# Patient Record
Sex: Female | Born: 1960
Health system: Southern US, Community
[De-identification: ages and names within clinical notes are randomized; demographics above are authoritative.]

## PROBLEM LIST (undated history)

## (undated) DIAGNOSIS — I10 Essential (primary) hypertension: Secondary | ICD-10-CM

## (undated) DIAGNOSIS — E119 Type 2 diabetes mellitus without complications: Secondary | ICD-10-CM

## (undated) DIAGNOSIS — M51369 Other intervertebral disc degeneration, lumbar region without mention of lumbar back pain or lower extremity pain: Secondary | ICD-10-CM

## (undated) DIAGNOSIS — M5442 Lumbago with sciatica, left side: Secondary | ICD-10-CM

## (undated) DIAGNOSIS — M5136 Other intervertebral disc degeneration, lumbar region: Secondary | ICD-10-CM

## (undated) DIAGNOSIS — J449 Chronic obstructive pulmonary disease, unspecified: Secondary | ICD-10-CM

## (undated) DIAGNOSIS — M5441 Lumbago with sciatica, right side: Secondary | ICD-10-CM

## (undated) DIAGNOSIS — K219 Gastro-esophageal reflux disease without esophagitis: Secondary | ICD-10-CM

## (undated) DIAGNOSIS — I2510 Atherosclerotic heart disease of native coronary artery without angina pectoris: Secondary | ICD-10-CM

## (undated) DIAGNOSIS — J45909 Unspecified asthma, uncomplicated: Secondary | ICD-10-CM

## (undated) DIAGNOSIS — E785 Hyperlipidemia, unspecified: Secondary | ICD-10-CM

## (undated) DIAGNOSIS — M797 Fibromyalgia: Secondary | ICD-10-CM

## (undated) HISTORY — PX: KNEE ARTHROSCOPY: SHX127

## (undated) HISTORY — PX: ABDOMINAL HYSTERECTOMY: SHX81

---

## 2004-08-02 ENCOUNTER — Emergency Department: Payer: Self-pay | Admitting: Emergency Medicine

## 2004-11-06 ENCOUNTER — Ambulatory Visit: Payer: Self-pay | Admitting: Internal Medicine

## 2006-04-16 ENCOUNTER — Other Ambulatory Visit: Payer: Self-pay

## 2006-04-16 ENCOUNTER — Emergency Department: Payer: Self-pay | Admitting: Emergency Medicine

## 2006-06-17 ENCOUNTER — Emergency Department: Payer: Self-pay | Admitting: Emergency Medicine

## 2006-06-17 ENCOUNTER — Other Ambulatory Visit: Payer: Self-pay

## 2006-11-10 ENCOUNTER — Emergency Department: Payer: Self-pay | Admitting: Emergency Medicine

## 2006-11-10 ENCOUNTER — Other Ambulatory Visit: Payer: Self-pay

## 2007-11-08 ENCOUNTER — Emergency Department: Payer: Self-pay | Admitting: Emergency Medicine

## 2007-11-08 ENCOUNTER — Other Ambulatory Visit: Payer: Self-pay

## 2009-06-10 ENCOUNTER — Emergency Department: Payer: Self-pay | Admitting: Emergency Medicine

## 2009-06-21 ENCOUNTER — Ambulatory Visit: Payer: Self-pay | Admitting: Orthopedic Surgery

## 2009-07-05 ENCOUNTER — Ambulatory Visit: Payer: Self-pay | Admitting: Orthopedic Surgery

## 2009-07-13 ENCOUNTER — Ambulatory Visit: Payer: Self-pay | Admitting: Orthopedic Surgery

## 2009-10-08 ENCOUNTER — Ambulatory Visit: Payer: Self-pay | Admitting: Orthopedic Surgery

## 2009-10-18 ENCOUNTER — Ambulatory Visit: Payer: Self-pay | Admitting: Orthopedic Surgery

## 2009-12-12 ENCOUNTER — Ambulatory Visit: Payer: Self-pay | Admitting: Orthopedic Surgery

## 2009-12-14 ENCOUNTER — Ambulatory Visit: Payer: Self-pay | Admitting: Orthopedic Surgery

## 2011-01-07 ENCOUNTER — Ambulatory Visit: Payer: Self-pay | Admitting: Orthopedic Surgery

## 2011-01-20 ENCOUNTER — Inpatient Hospital Stay: Payer: Self-pay | Admitting: Orthopedic Surgery

## 2011-01-21 LAB — PATHOLOGY REPORT

## 2011-02-27 ENCOUNTER — Ambulatory Visit: Payer: Self-pay | Admitting: Orthopedic Surgery

## 2011-04-08 ENCOUNTER — Encounter: Payer: Self-pay | Admitting: Physician Assistant

## 2011-04-20 ENCOUNTER — Encounter: Payer: Self-pay | Admitting: Physician Assistant

## 2012-04-14 ENCOUNTER — Emergency Department: Payer: Self-pay | Admitting: *Deleted

## 2012-09-28 ENCOUNTER — Ambulatory Visit: Payer: Self-pay | Admitting: Pain Medicine

## 2013-05-15 ENCOUNTER — Emergency Department: Payer: Self-pay | Admitting: Emergency Medicine

## 2013-07-13 ENCOUNTER — Encounter: Payer: Self-pay | Admitting: Geriatric Medicine

## 2013-07-20 ENCOUNTER — Encounter: Payer: Self-pay | Admitting: Geriatric Medicine

## 2013-07-26 ENCOUNTER — Ambulatory Visit: Payer: Self-pay | Admitting: Geriatric Medicine

## 2013-08-20 ENCOUNTER — Encounter: Payer: Self-pay | Admitting: Geriatric Medicine

## 2014-04-03 ENCOUNTER — Emergency Department: Payer: Self-pay | Admitting: Emergency Medicine

## 2014-06-21 ENCOUNTER — Emergency Department: Payer: Self-pay | Admitting: Emergency Medicine

## 2016-02-11 ENCOUNTER — Emergency Department
Admission: EM | Admit: 2016-02-11 | Discharge: 2016-02-11 | Disposition: A | Discharge status: Home / Self Care | Payer: Medicare HMO | Attending: Emergency Medicine | Admitting: Emergency Medicine

## 2016-02-11 DIAGNOSIS — F17210 Nicotine dependence, cigarettes, uncomplicated: Secondary | ICD-10-CM | POA: Diagnosis not present

## 2016-02-11 DIAGNOSIS — W57XXXA Bitten or stung by nonvenomous insect and other nonvenomous arthropods, initial encounter: Secondary | ICD-10-CM | POA: Diagnosis not present

## 2016-02-11 DIAGNOSIS — Y939 Activity, unspecified: Secondary | ICD-10-CM | POA: Diagnosis not present

## 2016-02-11 DIAGNOSIS — E119 Type 2 diabetes mellitus without complications: Secondary | ICD-10-CM | POA: Diagnosis not present

## 2016-02-11 DIAGNOSIS — I10 Essential (primary) hypertension: Secondary | ICD-10-CM | POA: Diagnosis not present

## 2016-02-11 DIAGNOSIS — Y929 Unspecified place or not applicable: Secondary | ICD-10-CM | POA: Insufficient documentation

## 2016-02-11 DIAGNOSIS — Y999 Unspecified external cause status: Secondary | ICD-10-CM | POA: Diagnosis not present

## 2016-02-11 DIAGNOSIS — S40262A Insect bite (nonvenomous) of left shoulder, initial encounter: Secondary | ICD-10-CM | POA: Insufficient documentation

## 2016-02-11 DIAGNOSIS — IMO0001 Reserved for inherently not codable concepts without codable children: Secondary | ICD-10-CM

## 2016-02-11 HISTORY — DX: Essential (primary) hypertension: I10

## 2016-02-11 HISTORY — DX: Type 2 diabetes mellitus without complications: E11.9

## 2016-02-11 HISTORY — DX: Fibromyalgia: M79.7

## 2016-02-11 MED ORDER — BACITRACIN ZINC 500 UNIT/GM EX OINT
TOPICAL_OINTMENT | Freq: Every day | CUTANEOUS | Status: DC
  Administered 2016-02-11: 1 via TOPICAL
  Filled 2016-02-11: qty 0.9

## 2016-02-11 NOTE — ED Notes (Signed)
Pt states she thinks she was bit by a spider Saturday.. Pt has an abrasion to the right upper shoulder.Marland Kitchen

## 2016-02-13 NOTE — ED Provider Notes (Signed)
[facility] Emergency Department Provider Note ____________________________________________  Time seen: 1351  I have reviewed the triage vital signs and the nursing notes.  HISTORY  Chief Complaint  Insect Bite   HPI Anne Brennan is a 55 y.o. female presents to the ED for evaluation of what she assumes was a spider bite to the posterior left shoulder blade.She believed the contact In on Saturday. Since that time her husband has been applying both alcohol and peroxide regularly to the region. She describes initially a small red bump with a small pustule in the center. Her husband describes that there was only clear fluid that was expressed from the pustule. The patient has continued to cleanse the area using peroxide. She presents today for evaluation where she assumes is an infected spider bite. She denies any fevers, chills, sweats. She rates her overall discomfort 8/10 in triage.  Past Medical History  Diagnosis Date  . Hypertension   . Diabetes mellitus without complication ([place])   . Fibromyalgia     There are no active problems to display for this patient.   Past Surgical History  Procedure Laterality Date  . Knee arthroscopy Bilateral     No current outpatient prescriptions on file.  Allergies Ibuprofen and Penicillins  No family history on file.  Social History Social History  Substance Use Topics  . Smoking status: Current Every Day Smoker    Types: Cigarettes  . Smokeless tobacco: None  . Alcohol Use: No   Review of Systems  Constitutional: Negative for fever. Musculoskeletal: Negative for back pain. Skin: Negative for rash. Insect bite as above Neurological: Negative for headaches, focal weakness or numbness. ____________________________________________  PHYSICAL EXAM:  VITAL SIGNS: ED Triage Vitals  Enc Vitals Group     BP 02/11/16 1231 164/77 mmHg     Pulse Rate 02/11/16 1231 87     Resp 02/11/16 1231 18     Temp  02/11/16 1231 97.9 F (36.6 C)     Temp Source 02/11/16 1231 Oral     SpO2 02/11/16 1231 94 %     Weight 02/11/16 1231 179 lb (81.194 kg)     Height 02/11/16 1231 5\' 4"  (1.626 m)     Head Cir --      Peak Flow --      Pain Score 02/11/16 1237 8     Pain Loc --      Pain Edu? --      Excl. in [place]? --    Constitutional: Alert and oriented. Well appearing and in no distress. Head: Normocephalic and atraumatic. Respiratory: Normal respiratory effort.  Musculoskeletal: Nontender with normal range of motion in all extremities.  Neurologic:  Normal gait without ataxia. Normal speech and language. No gross focal neurologic deficits are appreciated. Skin:  Skin is warm, dry and intact. No rash noted. Patient with a local scabbed area to the left shoulder blade without local erythema, edema, or induration. The skin is actually scabbed at this time consistent with a chemical burn due to persistent application of hydrogen peroxide. ____________________________________________  PROCEDURES  Bacitracin ointment Elastic bandage  ____________________________________________  INITIAL IMPRESSION / ASSESSMENT AND PLAN / ED COURSE  Patient with a likely insect bite to the left upper back without signs of infection. She does have a local superficial burn due to larger peroxide application. Patient is advised to discontinue all use of alcohol and peroxide. She is as it advised to apply antibiotic ointment to the area. She'll follow with primary  care provider as needed.  __________________________________________  FINAL CLINICAL IMPRESSION(S) / ED DIAGNOSES  Final diagnoses:  Insect bite of back, left, initial encounter      Melvenia Needles, PA-C 02/13/16 0116  Schuyler Amor, MD 02/14/16 2007

## 2016-09-13 ENCOUNTER — Encounter: Payer: Self-pay | Admitting: Emergency Medicine

## 2016-09-13 ENCOUNTER — Emergency Department
Admission: EM | Admit: 2016-09-13 | Discharge: 2016-09-13 | Disposition: A | Discharge status: Home / Self Care | Payer: Medicare HMO | Attending: Student in an Organized Health Care Education/Training Program | Admitting: Student in an Organized Health Care Education/Training Program

## 2016-09-13 DIAGNOSIS — F17210 Nicotine dependence, cigarettes, uncomplicated: Secondary | ICD-10-CM | POA: Diagnosis not present

## 2016-09-13 DIAGNOSIS — I10 Essential (primary) hypertension: Secondary | ICD-10-CM | POA: Diagnosis not present

## 2016-09-13 DIAGNOSIS — E119 Type 2 diabetes mellitus without complications: Secondary | ICD-10-CM | POA: Diagnosis not present

## 2016-09-13 DIAGNOSIS — J0100 Acute maxillary sinusitis, unspecified: Secondary | ICD-10-CM | POA: Insufficient documentation

## 2016-09-13 DIAGNOSIS — J029 Acute pharyngitis, unspecified: Secondary | ICD-10-CM

## 2016-09-13 DIAGNOSIS — R05 Cough: Secondary | ICD-10-CM | POA: Diagnosis present

## 2016-09-13 MED ORDER — SULFAMETHOXAZOLE-TRIMETHOPRIM 800-160 MG PO TABS
1.0000 | ORAL_TABLET | Freq: Once | ORAL | Status: AC
  Administered 2016-09-13: 1 via ORAL
  Filled 2016-09-13: qty 1

## 2016-09-13 MED ORDER — HYDROCOD POLST-CPM POLST ER 10-8 MG/5ML PO SUER
5.0000 mL | Freq: Once | ORAL | Status: AC
  Administered 2016-09-13: 5 mL via ORAL
  Filled 2016-09-13: qty 5

## 2016-09-13 MED ORDER — FLUCONAZOLE 150 MG PO TABS: 150.0000 mg | ORAL_TABLET | Freq: Every day | ORAL | 0 refills | Status: DC

## 2016-09-13 MED ORDER — HYDROCOD POLST-CPM POLST ER 10-8 MG/5ML PO SUER: 5.0000 mL | Freq: Two times a day (BID) | ORAL | 0 refills | Status: DC

## 2016-09-13 MED ORDER — PSEUDOEPHEDRINE HCL ER 120 MG PO TB12: 120.0000 mg | ORAL_TABLET | Freq: Two times a day (BID) | ORAL | 0 refills | Status: AC | PRN

## 2016-09-13 MED ORDER — SULFAMETHOXAZOLE-TRIMETHOPRIM 800-160 MG PO TABS: 1.0000 | ORAL_TABLET | Freq: Two times a day (BID) | ORAL | 0 refills | Status: DC

## 2016-09-13 NOTE — ED Triage Notes (Signed)
Sore throat and cough x 4 days.  ?

## 2016-09-13 NOTE — ED Provider Notes (Signed)
[facility] Emergency Department Provider Note   ____________________________________________   None    (approximate)  I have reviewed the triage vital signs and the nursing notes.   HISTORY  Chief Complaint Sore Throat    HPI Anne Brennan is a 55 y.o. female patient complain nasal congestion and postnasal drainage 2 weeks. Patient state her last 4 days she developed a sore throat and a productive cough. Patient also complaining of ear pressure but denies hearing loss. Patient denies nausea vomiting diarrhea.Patient rates the pain as a 9/10. Patient describes the pain as "achy". No palliative measures for his complaint.   Past Medical History:  Diagnosis Date  . Diabetes mellitus without complication ([place])   . Fibromyalgia   . Hypertension     There are no active problems to display for this patient.   Past Surgical History:  Procedure Laterality Date  . KNEE ARTHROSCOPY Bilateral     Prior to Admission medications   Medication Sig Start Date End Date Taking? Authorizing Provider  chlorpheniramine-HYDROcodone (TUSSIONEX PENNKINETIC ER) 10-8 MG/5ML SUER Take 5 mLs by mouth 2 (two) times daily. 09/13/16   Sable Feil, PA-C  fluconazole (DIFLUCAN) 150 MG tablet Take 1 tablet (150 mg total) by mouth daily. 09/13/16   Sable Feil, PA-C  pseudoephedrine (SUDAFED) 120 MG 12 hr tablet Take 1 tablet (120 mg total) by mouth 2 (two) times daily as needed for congestion. 09/13/16 09/13/17  Sable Feil, PA-C  sulfamethoxazole-trimethoprim (BACTRIM DS,SEPTRA DS) 800-160 MG tablet Take 1 tablet by mouth 2 (two) times daily. 09/13/16   Sable Feil, PA-C    Allergies Ibuprofen and Penicillins  No family history on file.  Social History Social History  Substance Use Topics  . Smoking status: Current Every Day Smoker    Packs/day: 0.50    Types: Cigarettes  . Smokeless tobacco: Not on file  . Alcohol use No    Review of  Systems Constitutional: No fever/chills Eyes: No visual changes. ENT: No sore throat. Cardiovascular: Denies chest pain. Respiratory: Denies shortness of breath. Gastrointestinal: No abdominal pain.  No nausea, no vomiting.  No diarrhea.  No constipation. Genitourinary: Negative for dysuria. Musculoskeletal: Negative for back pain. Skin: Negative for rash. Neurological: Negative for headaches, focal weakness or numbness. Endocrine:Diabetes and hypertension. Allergic/Immunilogical: See medication list __________________________________________   PHYSICAL EXAM:  VITAL SIGNS: ED Triage Vitals  Enc Vitals Group     BP 09/13/16 1053 (!) 156/103     Pulse Rate 09/13/16 1053 (!) 110     Resp 09/13/16 1053 18     Temp 09/13/16 1053 98.4 F (36.9 C)     Temp Source 09/13/16 1053 Oral     SpO2 09/13/16 1053 94 %     Weight 09/13/16 1055 180 lb (81.6 kg)     Height 09/13/16 1055 5\' 4"  (1.626 m)     Head Circumference --      Peak Flow --      Pain Score 09/13/16 1056 9     Pain Loc --      Pain Edu? --      Excl. in [place]? --     Constitutional: Alert and oriented. Well appearing and in no acute distress. Eyes: Conjunctivae are normal. PERRL. EOMI. Head: Atraumatic. Nose: Bilateral maxillary guarding with edematous nasal turbinates.  Mouth/Throat: Mucous membranes are moist.  Oropharynx erythematous. Edematous but non-exudative tonsils. Neck: No stridor.  No cervical spine tenderness to palpation. Cardiovascular: Normal rate,  regular rhythm. Grossly normal heart sounds.  Good peripheral circulation.Elevated blood pressure Respiratory: Normal respiratory effort.  No retractions. Lungs CTAB. Gastrointestinal: Soft and nontender. No distention. No abdominal bruits. No CVA tenderness. Musculoskeletal: No lower extremity tenderness nor edema.  No joint effusions. Neurologic:  Normal speech and language. No gross focal neurologic deficits are appreciated. No gait instability. Skin:  Skin  is warm, dry and intact. No rash noted. Psychiatric: Mood and affect are normal. Speech and behavior are normal.  ____________________________________________   LABS (all labs ordered are listed, but only abnormal results are displayed)  Labs Reviewed - No data to display ____________________________________________  EKG   ____________________________________________  RADIOLOGY   ____________________________________________   PROCEDURES  Procedure(s) performed: None  Procedures  Critical Care performed: No  ____________________________________________   INITIAL IMPRESSION / ASSESSMENT AND PLAN / ED COURSE  Pertinent labs & imaging results that were available during my care of the patient were reviewed by me and considered in my medical decision making (see chart for details).  Maxillary sinusitis and pharyngitis. Patient given discharge Instructions. Patient given a prescription for Bactrim DS, Tussionex, Diflucan, and Sudafed. Patient advised follow-up family doctor if no improvement 3 days.  Clinical Course      ____________________________________________   FINAL CLINICAL IMPRESSION(S) / ED DIAGNOSES  Final diagnoses:  Subacute maxillary sinusitis  Sore throat      NEW MEDICATIONS STARTED DURING THIS VISIT:  New Prescriptions   CHLORPHENIRAMINE-HYDROCODONE (TUSSIONEX PENNKINETIC ER) 10-8 MG/5ML SUER    Take 5 mLs by mouth 2 (two) times daily.   FLUCONAZOLE (DIFLUCAN) 150 MG TABLET    Take 1 tablet (150 mg total) by mouth daily.   PSEUDOEPHEDRINE (SUDAFED) 120 MG 12 HR TABLET    Take 1 tablet (120 mg total) by mouth 2 (two) times daily as needed for congestion.   SULFAMETHOXAZOLE-TRIMETHOPRIM (BACTRIM DS,SEPTRA DS) 800-160 MG TABLET    Take 1 tablet by mouth 2 (two) times daily.     Note:  This document was prepared using Dragon voice recognition software and may include unintentional dictation errors.    Sable Feil, PA-C 09/13/16 1128     Merlyn Lot, MD 09/13/16 1154

## 2016-10-27 ENCOUNTER — Encounter: Payer: Self-pay | Admitting: *Deleted

## 2016-10-27 ENCOUNTER — Emergency Department
Admission: EM | Admit: 2016-10-27 | Discharge: 2016-10-27 | Disposition: A | Discharge status: Home / Self Care | Payer: Medicare HMO | Attending: Emergency Medicine | Admitting: Emergency Medicine

## 2016-10-27 ENCOUNTER — Emergency Department: Payer: Medicare HMO

## 2016-10-27 DIAGNOSIS — I10 Essential (primary) hypertension: Secondary | ICD-10-CM | POA: Diagnosis not present

## 2016-10-27 DIAGNOSIS — F17210 Nicotine dependence, cigarettes, uncomplicated: Secondary | ICD-10-CM | POA: Insufficient documentation

## 2016-10-27 DIAGNOSIS — J189 Pneumonia, unspecified organism: Secondary | ICD-10-CM | POA: Diagnosis not present

## 2016-10-27 DIAGNOSIS — E119 Type 2 diabetes mellitus without complications: Secondary | ICD-10-CM | POA: Insufficient documentation

## 2016-10-27 DIAGNOSIS — R05 Cough: Secondary | ICD-10-CM | POA: Diagnosis present

## 2016-10-27 DIAGNOSIS — Z79899 Other long term (current) drug therapy: Secondary | ICD-10-CM | POA: Diagnosis not present

## 2016-10-27 MED ORDER — AZITHROMYCIN 250 MG PO TABS
ORAL_TABLET | ORAL | 0 refills | Status: DC
Start: 2016-10-27 — End: 2017-10-14

## 2016-10-27 MED ORDER — ALBUTEROL SULFATE (2.5 MG/3ML) 0.083% IN NEBU
2.5000 mg | INHALATION_SOLUTION | Freq: Once | RESPIRATORY_TRACT | Status: AC
  Administered 2016-10-27: 2.5 mg via RESPIRATORY_TRACT
  Filled 2016-10-27: qty 3

## 2016-10-27 MED ORDER — METHYLPREDNISOLONE SODIUM SUCC 125 MG IJ SOLR
125.0000 mg | Freq: Once | INTRAMUSCULAR | Status: AC
  Administered 2016-10-27: 125 mg via INTRAMUSCULAR
  Filled 2016-10-27: qty 2

## 2016-10-27 MED ORDER — PREDNISONE 50 MG PO TABS: 50.0000 mg | ORAL_TABLET | Freq: Every day | ORAL | 0 refills | Status: DC

## 2016-10-27 NOTE — ED Triage Notes (Signed)
Pt reports a cough, bodyaches, runny nose for 2-3 weeks.  Pt also has sinus congestion.  Pt alert.

## 2016-10-27 NOTE — ED Provider Notes (Signed)
[facility] Emergency Department Provider Note  ____________________________________________  Time seen: Approximately 8:47 PM  I have reviewed the triage vital signs and the nursing notes.   HISTORY  Chief Complaint Influenza    HPI Anne Brennan is a 56 y.o. female who presents emergency department complaining of 3 week history of nasal congestion, scratchy throat, coughing. Patient's symptoms began somewhat to her URI. She states that concerning symptoms are the cough that has lingered for the last 3 weeks. Patient states that she does not feel "awful" she just does not feel "good." Patient denies any history of asthma, COPD, emphysema. Patient denies any fevers or chills. She denies any chest pain or difficulty breathing. Patient has tried multiple over-the-counter medications with no improvement of symptoms.   Past Medical History:  Diagnosis Date  . Diabetes mellitus without complication ([place])   . Fibromyalgia   . Hypertension     There are no active problems to display for this patient.   Past Surgical History:  Procedure Laterality Date  . KNEE ARTHROSCOPY Bilateral     Prior to Admission medications   Medication Sig Start Date End Date Taking? Authorizing Provider  azithromycin (ZITHROMAX Z-PAK) 250 MG tablet Take 2 tablets (500 mg) on  Day 1,  followed by 1 tablet (250 mg) once daily on Days 2 through 5. 10/27/16   Roderic Palau D Maurilio Puryear, PA-C  chlorpheniramine-HYDROcodone (TUSSIONEX PENNKINETIC ER) 10-8 MG/5ML SUER Take 5 mLs by mouth 2 (two) times daily. 09/13/16   Sable Feil, PA-C  fluconazole (DIFLUCAN) 150 MG tablet Take 1 tablet (150 mg total) by mouth daily. 09/13/16   Sable Feil, PA-C  predniSONE (DELTASONE) 50 MG tablet Take 1 tablet (50 mg total) by mouth daily with breakfast. 10/27/16   Charline Bills Adella Manolis, PA-C  pseudoephedrine (SUDAFED) 120 MG 12 hr tablet Take 1 tablet (120 mg total) by mouth 2 (two) times daily as  needed for congestion. 09/13/16 09/13/17  Sable Feil, PA-C  sulfamethoxazole-trimethoprim (BACTRIM DS,SEPTRA DS) 800-160 MG tablet Take 1 tablet by mouth 2 (two) times daily. 09/13/16   Sable Feil, PA-C    Allergies Ibuprofen and Penicillins  No family history on file.  Social History Social History  Substance Use Topics  . Smoking status: Current Every Day Smoker    Packs/day: 0.50    Types: Cigarettes  . Smokeless tobacco: Never Used  . Alcohol use No     Review of Systems  Constitutional: No fever/chills Eyes: No visual changes. No discharge ENT: Resolved, nasal congestion and sore throat. Cardiovascular: no chest pain. Respiratory: Positive dry cough. No SOB. Gastrointestinal: No abdominal pain.  No nausea, no vomiting.  No diarrhea.  No constipation. Musculoskeletal: Negative for musculoskeletal pain. Skin: Negative for rash, abrasions, lacerations, ecchymosis. Neurological: Negative for headaches, focal weakness or numbness. 10-point ROS otherwise negative.  ____________________________________________   PHYSICAL EXAM:  VITAL SIGNS: ED Triage Vitals  Enc Vitals Group     BP 10/27/16 2022 (!) 150/87     Pulse Rate 10/27/16 2022 94     Resp 10/27/16 2022 20     Temp 10/27/16 2022 98.2 F (36.8 C)     Temp Source 10/27/16 2022 Oral     SpO2 10/27/16 2022 97 %     Weight 10/27/16 2023 174 lb (78.9 kg)     Height 10/27/16 2023 5\' 4"  (1.626 m)     Head Circumference --      Peak Flow --  Pain Score 10/27/16 2023 8     Pain Loc --      Pain Edu? --      Excl. in [place]? --      Constitutional: Alert and oriented. Well appearing and in no acute distress. Eyes: Conjunctivae are normal. PERRL. EOMI. Head: Atraumatic. ENT:      Ears: EACs and TMs unremarkable bilaterally      Nose: No congestion/rhinnorhea.      Mouth/Throat: Mucous membranes are moist.  Neck: No stridor.   Hematological/Lymphatic/Immunilogical: No cervical  lymphadenopathy. Cardiovascular: Normal rate, regular rhythm. Normal S1 and S2.  Good peripheral circulation. Respiratory: Normal respiratory effort without tachypnea or retractions. Lungs with diffuse respiratory wheezing bilaterally. Mild rhonchi noted to the left lower lung field. No rales.Kermit Balo air entry to the bases with no decreased or absent breath sounds. Musculoskeletal: Full range of motion to all extremities. No gross deformities appreciated. Neurologic:  Normal speech and language. No gross focal neurologic deficits are appreciated.  Skin:  Skin is warm, dry and intact. No rash noted. Psychiatric: Mood and affect are normal. Speech and behavior are normal. Patient exhibits appropriate insight and judgement.   ____________________________________________   LABS (all labs ordered are listed, but only abnormal results are displayed)  Labs Reviewed - No data to display ____________________________________________  EKG   ____________________________________________  RADIOLOGY Diamantina [place] Chandlor Noecker, personally viewed and evaluated these images (plain radiographs) as part of my medical decision making, as well as reviewing the written report by the radiologist.  Dg Chest 2 View  Result Date: 10/27/2016 CLINICAL DATA:  Cough and body aches EXAM: CHEST  2 VIEW COMPARISON:  01/22/2011 FINDINGS: The heart size and mediastinal contours are within normal limits. Both lungs are clear. Degenerative changes of the spine. IMPRESSION: No active cardiopulmonary disease. Electronically Signed   By: Donavan Foil M.D.   On: 10/27/2016 21:28    ____________________________________________    PROCEDURES  Procedure(s) performed:    Procedures    Medications  albuterol (PROVENTIL) (2.5 MG/3ML) 0.083% nebulizer solution 2.5 mg (2.5 mg Nebulization Given 10/27/16 2207)  methylPREDNISolone sodium succinate (SOLU-MEDROL) 125 mg/2 mL injection 125 mg (125 mg Intramuscular Given 10/27/16  2205)     ____________________________________________   INITIAL IMPRESSION / ASSESSMENT AND PLAN / ED COURSE  Pertinent labs & imaging results that were available during my care of the patient were reviewed by me and considered in my medical decision making (see chart for details).  Review of the [place] CSRS was performed in accordance of the [place] prior to dispensing any controlled drugs.  Clinical Course     Patient's diagnosis is consistent with Community-acquired pneumonia. Patient's symptoms admit ongoing 3 weeks. Chest x-ray reveals no acute consolidation. Patient had diffuse wheezing and mild rhonchi to the left lower lung field. Lung sounds improved after injection of steroid and breathing treatment in emergency department. Patient has an albuterol inhaler at home from a previous infection is encouraged to use same every 4 hours.. Patient will be discharged home with prescriptions for short course of steroids and antibiotics. Patient is to follow up with primary care as needed or otherwise directed. Patient is given ED precautions to return to the ED for any worsening or new symptoms.     ____________________________________________  FINAL CLINICAL IMPRESSION(S) / ED DIAGNOSES  Final diagnoses:  Community acquired pneumonia, unspecified laterality      NEW MEDICATIONS STARTED DURING THIS VISIT:  New Prescriptions   AZITHROMYCIN (ZITHROMAX Z-PAK) 250 MG TABLET  Take 2 tablets (500 mg) on  Day 1,  followed by 1 tablet (250 mg) once daily on Days 2 through 5.   PREDNISONE (DELTASONE) 50 MG TABLET    Take 1 tablet (50 mg total) by mouth daily with breakfast.        This chart was dictated using voice recognition software/Dragon. Despite best efforts to proofread, errors can occur which can change the meaning. Any change was purely unintentional.    Darletta Moll, PA-C 10/27/16 2222    Eula Listen, MD 10/27/16 2324

## 2017-02-06 ENCOUNTER — Encounter: Payer: Self-pay | Admitting: *Deleted

## 2017-02-06 DIAGNOSIS — R51 Headache: Secondary | ICD-10-CM | POA: Insufficient documentation

## 2017-02-06 DIAGNOSIS — F17210 Nicotine dependence, cigarettes, uncomplicated: Secondary | ICD-10-CM | POA: Diagnosis not present

## 2017-02-06 DIAGNOSIS — R0981 Nasal congestion: Secondary | ICD-10-CM | POA: Diagnosis not present

## 2017-02-06 DIAGNOSIS — E119 Type 2 diabetes mellitus without complications: Secondary | ICD-10-CM | POA: Diagnosis not present

## 2017-02-06 DIAGNOSIS — R0789 Other chest pain: Secondary | ICD-10-CM | POA: Insufficient documentation

## 2017-02-06 DIAGNOSIS — I10 Essential (primary) hypertension: Secondary | ICD-10-CM | POA: Insufficient documentation

## 2017-02-06 NOTE — ED Triage Notes (Signed)
Pt to triage via wheelchair.  Pt has a headache pt also reports nausea.  Pt took tylenol without relief.  Pt is alert.  Speech clear.

## 2017-02-07 ENCOUNTER — Emergency Department: Payer: Medicare HMO

## 2017-02-07 ENCOUNTER — Emergency Department
Admission: EM | Admit: 2017-02-07 | Discharge: 2017-02-07 | Disposition: A | Discharge status: Home / Self Care | Payer: Medicare HMO | Attending: Emergency Medicine | Admitting: Emergency Medicine

## 2017-02-07 DIAGNOSIS — R079 Chest pain, unspecified: Secondary | ICD-10-CM

## 2017-02-07 DIAGNOSIS — R51 Headache: Secondary | ICD-10-CM

## 2017-02-07 DIAGNOSIS — R519 Headache, unspecified: Secondary | ICD-10-CM

## 2017-02-07 LAB — CBC
HEMATOCRIT: 41.9 % (ref 35.0–47.0)
HEMOGLOBIN: 14.5 g/dL (ref 12.0–16.0)
MCH: 31.3 pg (ref 26.0–34.0)
MCHC: 34.7 g/dL (ref 32.0–36.0)
MCV: 90.3 fL (ref 80.0–100.0)
Platelets: 311 10*3/uL (ref 150–440)
RBC: 4.64 MIL/uL (ref 3.80–5.20)
RDW: 13.6 % (ref 11.5–14.5)
WBC: 8.8 10*3/uL (ref 3.6–11.0)

## 2017-02-07 LAB — BASIC METABOLIC PANEL
ANION GAP: 9 (ref 5–15)
BUN: 14 mg/dL (ref 6–20)
CHLORIDE: 103 mmol/L (ref 101–111)
CO2: 28 mmol/L (ref 22–32)
Calcium: 9.7 mg/dL (ref 8.9–10.3)
Creatinine, Ser: 0.7 mg/dL (ref 0.44–1.00)
GFR calc non Af Amer: >60 mL/min (ref >60)
GLUCOSE: 147 mg/dL — AB (ref 65–99)
Potassium: 3.1 mmol/L — ABNORMAL LOW (ref 3.5–5.1)
Sodium: 140 mmol/L (ref 135–145)

## 2017-02-07 LAB — TROPONIN I: Troponin I: <0.03 ng/mL (ref <0.03)

## 2017-02-07 MED ORDER — AZITHROMYCIN 250 MG PO TABS: ORAL_TABLET | ORAL | 0 refills | Status: AC

## 2017-02-07 MED ORDER — METOCLOPRAMIDE HCL 5 MG/ML IJ SOLN
10.0000 mg | Freq: Once | INTRAMUSCULAR | Status: AC
  Administered 2017-02-07: 10 mg via INTRAVENOUS
  Filled 2017-02-07: qty 2

## 2017-02-07 MED ORDER — MORPHINE SULFATE (PF) 2 MG/ML IV SOLN
2.0000 mg | Freq: Once | INTRAVENOUS | Status: AC
  Administered 2017-02-07: 2 mg via INTRAVENOUS
  Filled 2017-02-07: qty 1

## 2017-02-07 NOTE — ED Provider Notes (Signed)
[facility] Emergency Department Provider Note    First MD Initiated Contact with Patient 02/07/17 0040     (approximate)  I have reviewed the triage vital signs and the nursing notes.   HISTORY  Chief Complaint Headache    HPI Anne Brennan is a 56 y.o. female with below list of chronic medical conditions presents with multiple medical complaints including frontal headache for "a while". Patient states about a month. Patient also admits to nausea however no vomiting. Patient also admits to left lateral chest discomfort. Patient denies any cough no dyspnea. Patient states her current pain score was 9 out of 10. Patient denies any largely pain or swelling. No history DVT or PE. Regarding headache patient denies any weakness numbness gait instability or visual changes. Patient does admit to nasal congestion and concern for possible sinusitis.   Past Medical History:  Diagnosis Date  . Diabetes mellitus without complication ([place])   . Fibromyalgia   . Hypertension     There are no active problems to display for this patient.   Past Surgical History:  Procedure Laterality Date  . KNEE ARTHROSCOPY Bilateral     Prior to Admission medications   Medication Sig Start Date End Date Taking? Authorizing Provider  azithromycin (ZITHROMAX Z-PAK) 250 MG tablet Take 2 tablets (500 mg) on  Day 1,  followed by 1 tablet (250 mg) once daily on Days 2 through 5. 10/27/16   Roderic Palau D Cuthriell, PA-C  azithromycin (ZITHROMAX Z-PAK) 250 MG tablet Take 2 tablets (500 mg) on  Day 1,  followed by 1 tablet (250 mg) once daily on Days 2 through 5. 02/07/17 02/12/17  Gregor Hams, MD  chlorpheniramine-HYDROcodone [facility] PENNKINETIC ER) 10-8 MG/5ML SUER Take 5 mLs by mouth 2 (two) times daily. 09/13/16   Sable Feil, PA-C  fluconazole (DIFLUCAN) 150 MG tablet Take 1 tablet (150 mg total) by mouth daily. 09/13/16   Sable Feil, PA-C  predniSONE (DELTASONE) 50 MG  tablet Take 1 tablet (50 mg total) by mouth daily with breakfast. 10/27/16   Charline Bills Cuthriell, PA-C  pseudoephedrine (SUDAFED) 120 MG 12 hr tablet Take 1 tablet (120 mg total) by mouth 2 (two) times daily as needed for congestion. 09/13/16 09/13/17  Sable Feil, PA-C  sulfamethoxazole-trimethoprim (BACTRIM DS,SEPTRA DS) 800-160 MG tablet Take 1 tablet by mouth 2 (two) times daily. 09/13/16   Sable Feil, PA-C    Allergies Ibuprofen and Penicillins  Family history Noncontributory  Social History Social History  Substance Use Topics  . Smoking status: Current Every Day Smoker    Packs/day: 0.50    Types: Cigarettes  . Smokeless tobacco: Never Used  . Alcohol use No    Review of Systems Constitutional: No fever/chills Eyes: No visual changes. ENT: No sore throat.Positive for nasal congestion Cardiovascular: Positive for chest pain. Respiratory: Denies shortness of breath. Gastrointestinal: No abdominal pain.  No nausea, no vomiting.  No diarrhea.  No constipation. Genitourinary: Negative for dysuria. Musculoskeletal: Negative for back pain. Skin: Negative for rash. Neurological: Positive for headache, negative for focal weakness or numbness.  10-point ROS otherwise negative.  ____________________________________________   PHYSICAL EXAM:  VITAL SIGNS: ED Triage Vitals [02/06/17 2134]  Enc Vitals Group     BP (!) 165/86     Pulse Rate 93     Resp 20     Temp 98.5 F (36.9 C)     Temp Source Oral     SpO2 99 %  Weight 180 lb (81.6 kg)     Height 5\' 4"  (1.626 m)     Head Circumference      Peak Flow      Pain Score 8     Pain Loc      Pain Edu?      Excl. in [place]?    Constitutional: Alert and oriented. Well appearing and in no acute distress. Eyes: Conjunctivae are normal. PERRL. EOMI. Head: Atraumatic. Ears:  Healthy appearing ear canals and TMs bilaterally Nose: No congestion/rhinnorhea.Pain with percussion maxillary and frontal  sinuses Mouth/Throat: Mucous membranes are moist.  Oropharynx non-erythematous. Neck: No stridor.  No meningeal signs.  Cardiovascular: Normal rate, regular rhythm. Good peripheral circulation. Grossly normal heart sounds. Respiratory: Normal respiratory effort.  No retractions. Lungs CTAB. Gastrointestinal: Soft and nontender. No distention.  Musculoskeletal: No lower extremity tenderness nor edema. No gross deformities of extremities. Neurologic:  Normal speech and language. No gross focal neurologic deficits are appreciated.  Skin:  Skin is warm, dry and intact. No rash noted. Psychiatric: Mood and affect are normal. Speech and behavior are normal.  ____________________________________________   LABS (all labs ordered are listed, but only abnormal results are displayed)  Labs Reviewed  BASIC METABOLIC PANEL - Abnormal; Notable for the following:       Result Value   Potassium 3.1 (*)    Glucose, Bld 147 (*)    All other components within normal limits  CBC  TROPONIN I   ____________________________________________  EKG  ED ECG REPORT I, Valeria N Alainna Stawicki, the attending physician, personally viewed and interpreted this ECG.   Date: 02/08/2017  EKG Time: 2:04 AM  Rate: 79  Rhythm: Normal sinus rhythm  Axis: Normal  Intervals: Normal  ST&T Change: None  ____________________________________________  RADIOLOGY I, Weston N Suheyb Raucci, personally viewed and evaluated these images (plain radiographs) as part of my medical decision making, as well as reviewing the written report by the radiologist.  Ct Head Wo Contrast  Result Date: 02/07/2017 CLINICAL DATA:  Frontal headaches, dizziness, and nausea. EXAM: CT HEAD WITHOUT CONTRAST TECHNIQUE: Contiguous axial images were obtained from the base of the skull through the vertex without intravenous contrast. COMPARISON:  None. FINDINGS: Brain: No mass-effect or midline shift. Suggestion of mild low-attenuation change in the frontal  white matter. This may may be due to artifact or could represent early small vessel ischemic change or other white matter disease. Gray-white matter junctions are distinct. Basal cisterns are not effaced. No abnormal extra-axial fluid collections. No ventricular dilatation. No acute intracranial hemorrhage. Vascular: Vascular calcifications are present. Skull: No depressed skull fractures. Sinuses/Orbits: Paranasal sinuses and mastoid air cells are not opacified. Other: None. IMPRESSION: Vague white matter changes in the frontal lobes may represent artifact, small vessel ischemia or other white matter disease. No acute intracranial abnormalities are identified. Electronically Signed   By: Lucienne Capers M.D.   On: 02/07/2017 02:04   Dg Chest Port 1 View  Result Date: 02/07/2017 CLINICAL DATA:  Headache and nausea. History of diabetes and hypertension. Smoker. EXAM: PORTABLE CHEST 1 VIEW COMPARISON:  10/27/2016 FINDINGS: Normal heart size and pulmonary vascularity. No focal airspace disease or consolidation in the lungs. No blunting of costophrenic angles. No pneumothorax. Mediastinal contours appear intact. Degenerative changes in the spine. IMPRESSION: No active disease. Electronically Signed   By: Lucienne Capers M.D.   On: 02/07/2017 01:39     Procedures   ____________________________________________   INITIAL IMPRESSION / ASSESSMENT AND PLAN / ED COURSE  Pertinent labs & imaging results that were available during my care of the patient were reviewed by me and considered in my medical decision making (see chart for details).  No clear etiology identified for the patient's chest discomfort. EKG revealed no evidence of ischemia or infarction given one month history of chest pain 1 and troponin performed which was negative. Patient be referred to primary care provider for further outpatient evaluation regarding chest pain. Regarding frontal and maxillary sinus pain suspect sinusitis.       ____________________________________________  FINAL CLINICAL IMPRESSION(S) / ED DIAGNOSES  Final diagnoses:  Sinus headache  Chest pain, unspecified type     MEDICATIONS GIVEN DURING THIS VISIT:  Medications  morphine 2 MG/ML injection 2 mg (2 mg Intravenous Given 02/07/17 0210)  metoCLOPramide (REGLAN) injection 10 mg (10 mg Intravenous Given 02/07/17 0210)     NEW OUTPATIENT MEDICATIONS STARTED DURING THIS VISIT:  Discharge Medication List as of 02/07/2017  3:46 AM      Discharge Medication List as of 02/07/2017  3:46 AM      Discharge Medication List as of 02/07/2017  3:46 AM       Note:  This document was prepared using Dragon voice recognition software and may include unintentional dictation errors.    Gregor Hams, MD 02/08/17 [phone]

## 2017-04-19 ENCOUNTER — Emergency Department: Payer: Medicare HMO

## 2017-04-19 DIAGNOSIS — Y92009 Unspecified place in unspecified non-institutional (private) residence as the place of occurrence of the external cause: Secondary | ICD-10-CM | POA: Diagnosis not present

## 2017-04-19 DIAGNOSIS — Y999 Unspecified external cause status: Secondary | ICD-10-CM | POA: Diagnosis not present

## 2017-04-19 DIAGNOSIS — W230XXA Caught, crushed, jammed, or pinched between moving objects, initial encounter: Secondary | ICD-10-CM | POA: Insufficient documentation

## 2017-04-19 DIAGNOSIS — I10 Essential (primary) hypertension: Secondary | ICD-10-CM | POA: Insufficient documentation

## 2017-04-19 DIAGNOSIS — E119 Type 2 diabetes mellitus without complications: Secondary | ICD-10-CM | POA: Diagnosis not present

## 2017-04-19 DIAGNOSIS — F17210 Nicotine dependence, cigarettes, uncomplicated: Secondary | ICD-10-CM | POA: Diagnosis not present

## 2017-04-19 DIAGNOSIS — M79641 Pain in right hand: Secondary | ICD-10-CM | POA: Diagnosis present

## 2017-04-19 DIAGNOSIS — T148XXA Other injury of unspecified body region, initial encounter: Secondary | ICD-10-CM | POA: Insufficient documentation

## 2017-04-19 DIAGNOSIS — M797 Fibromyalgia: Secondary | ICD-10-CM | POA: Diagnosis not present

## 2017-04-19 DIAGNOSIS — Z79899 Other long term (current) drug therapy: Secondary | ICD-10-CM | POA: Insufficient documentation

## 2017-04-19 DIAGNOSIS — Y939 Activity, unspecified: Secondary | ICD-10-CM | POA: Insufficient documentation

## 2017-04-19 DIAGNOSIS — Z880 Allergy status to penicillin: Secondary | ICD-10-CM | POA: Diagnosis not present

## 2017-04-19 NOTE — ED Triage Notes (Signed)
Patient reports as she was walking in to house, finger caught on door jam.  Now with pain right 4th digit pain at knuckle.

## 2017-04-20 ENCOUNTER — Emergency Department
Admission: EM | Admit: 2017-04-20 | Discharge: 2017-04-20 | Disposition: A | Discharge status: Home / Self Care | Payer: Medicare HMO | Attending: Emergency Medicine | Admitting: Emergency Medicine

## 2017-04-20 DIAGNOSIS — T148XXA Other injury of unspecified body region, initial encounter: Secondary | ICD-10-CM

## 2017-04-20 NOTE — ED Provider Notes (Signed)
[facility] Emergency Department Provider Note  ____________________________________________   First MD Initiated Contact with Patient 04/20/17 [phone]     (approximate)  I have reviewed the triage vital signs and the nursing notes.   HISTORY  Chief Complaint Hand Pain   HPI Anne Brennan is a 56 y.o. female with a history of diabetes who is presenting to the emergency department tonight with right hand pain. She says that earlier this evening she was turning around when her right ring finger was caught on the door frame at her home. She says that the finger was stretched laterally towards her thumb. She has had pain around the metacarpophalangeal joint especially with movement. She does not report any numbness.   Past Medical History:  Diagnosis Date  . Diabetes mellitus without complication ([place])   . Fibromyalgia   . Hypertension     There are no active problems to display for this patient.   Past Surgical History:  Procedure Laterality Date  . KNEE ARTHROSCOPY Bilateral     Prior to Admission medications   Medication Sig Start Date End Date Taking? Authorizing Provider  azithromycin (ZITHROMAX Z-PAK) 250 MG tablet Take 2 tablets (500 mg) on  Day 1,  followed by 1 tablet (250 mg) once daily on Days 2 through 5. 10/27/16   Cuthriell, Charline Bills, PA-C  chlorpheniramine-HYDROcodone (TUSSIONEX PENNKINETIC ER) 10-8 MG/5ML SUER Take 5 mLs by mouth 2 (two) times daily. 09/13/16   Sable Feil, PA-C  fluconazole (DIFLUCAN) 150 MG tablet Take 1 tablet (150 mg total) by mouth daily. 09/13/16   Sable Feil, PA-C  predniSONE (DELTASONE) 50 MG tablet Take 1 tablet (50 mg total) by mouth daily with breakfast. 10/27/16   Cuthriell, Charline Bills, PA-C  pseudoephedrine (SUDAFED) 120 MG 12 hr tablet Take 1 tablet (120 mg total) by mouth 2 (two) times daily as needed for congestion. 09/13/16 09/13/17  Sable Feil, PA-C  sulfamethoxazole-trimethoprim  (BACTRIM DS,SEPTRA DS) 800-160 MG tablet Take 1 tablet by mouth 2 (two) times daily. 09/13/16   Sable Feil, PA-C    Allergies Ibuprofen and Penicillins  No family history on file.  Social History Social History  Substance Use Topics  . Smoking status: Current Every Day Smoker    Packs/day: 0.50    Types: Cigarettes  . Smokeless tobacco: Never Used  . Alcohol use No    Review of Systems  Constitutional: No fever/chills ENT: No sore throat. Cardiovascular: Denies chest pain. Respiratory: Denies shortness of breath. Gastrointestinal: No abdominal pain.  No nausea, no vomiting.  No diarrhea.  No constipation. Musculoskeletal: as above Skin: Negative for rash. Neurological: Negative for headaches, focal weakness or numbness.   ____________________________________________   PHYSICAL EXAM:  VITAL SIGNS: ED Triage Vitals  Enc Vitals Group     BP 04/19/17 2235 (!) 159/76     Pulse Rate 04/19/17 2235 81     Resp 04/19/17 2235 20     Temp 04/19/17 2235 99.1 F (37.3 C)     Temp Source 04/19/17 2235 Oral     SpO2 04/19/17 2235 96 %     Weight 04/19/17 2236 182 lb (82.6 kg)     Height 04/19/17 2236 5\' 4"  (1.626 m)     Head Circumference --      Peak Flow --      Pain Score 04/19/17 2235 8     Pain Loc --      Pain Edu? --  Excl. in [place]? --     Constitutional: Alert and oriented. Well appearing and in no acute distress. Eyes: Conjunctivae are normal.  Head: Atraumatic. Nose: No congestion/rhinnorhea. Mouth/Throat: Mucous membranes are moist.  Neck: No stridor.   Cardiovascular: Normal rate, regular rhythm. Grossly normal heart sounds.  Good peripheral circulation With intact right radial pulse. Respiratory: Normal respiratory effort.  No retractions. Lungs CTAB. Gastrointestinal: Soft and nontender. No distention. No CVA tenderness. Musculoskeletal: No lower extremity tenderness nor edema.  No joint effusions.  Right ring finger with full range of motion  but with pain. Sensation is intact distally with a brisk capillary refill. There is no deformity or swelling. Mild tenderness to palpation along the fourth metacarpal. Able to range all joints in all directions.  Neurologic:  Normal speech and language. No gross focal neurologic deficits are appreciated. Skin:  Skin is warm, dry and intact. No rash noted. Psychiatric: Mood and affect are normal. Speech and behavior are normal.  ____________________________________________   LABS (all labs ordered are listed, but only abnormal results are displayed)  Labs Reviewed - No data to display ____________________________________________  EKG   ____________________________________________  RADIOLOGY  No acute finding on the x-ray of the hand. ____________________________________________   PROCEDURES  Procedure(s) performed:   Procedures  Critical Care performed:   ____________________________________________   INITIAL IMPRESSION / ASSESSMENT AND PLAN / ED COURSE  Pertinent labs & imaging results that were available during my care of the patient were reviewed by me and considered in my medical decision making (see chart for details).  Likely a hyper extension injury. Ring finger and middle finger were buddy taped for comfort. Explained to patient the likely diagnosis as well as the resolve the x-ray. She'll be discharged home. Recommended ice as well as Aspercreme and I see hot. She is understanding of plan and willing to comply.      ____________________________________________   FINAL CLINICAL IMPRESSION(S) / ED DIAGNOSES  Soft tissue injury of the hand.    NEW MEDICATIONS STARTED DURING THIS VISIT:  New Prescriptions   No medications on file     Note:  This document was prepared using Dragon voice recognition software and may include unintentional dictation errors.     Orbie Pyo, MD 04/20/17 [phone]

## 2017-04-20 NOTE — ED Notes (Signed)
Report off to April rn  

## 2017-04-20 NOTE — ED Notes (Signed)
Pt has pain in right hand .  Pt struck hand on door jam today    No swelling noted.  Denies other injury

## 2017-10-14 ENCOUNTER — Encounter: Payer: Self-pay | Admitting: Physician Assistant

## 2017-10-14 ENCOUNTER — Emergency Department
Admission: EM | Admit: 2017-10-14 | Discharge: 2017-10-14 | Disposition: A | Discharge status: Home / Self Care | Payer: Medicare HMO | Attending: Emergency Medicine | Admitting: Emergency Medicine

## 2017-10-14 ENCOUNTER — Other Ambulatory Visit: Payer: Self-pay

## 2017-10-14 ENCOUNTER — Emergency Department: Payer: Medicare HMO

## 2017-10-14 DIAGNOSIS — I10 Essential (primary) hypertension: Secondary | ICD-10-CM | POA: Insufficient documentation

## 2017-10-14 DIAGNOSIS — F17210 Nicotine dependence, cigarettes, uncomplicated: Secondary | ICD-10-CM | POA: Insufficient documentation

## 2017-10-14 DIAGNOSIS — J069 Acute upper respiratory infection, unspecified: Secondary | ICD-10-CM | POA: Insufficient documentation

## 2017-10-14 DIAGNOSIS — E119 Type 2 diabetes mellitus without complications: Secondary | ICD-10-CM | POA: Insufficient documentation

## 2017-10-14 DIAGNOSIS — R05 Cough: Secondary | ICD-10-CM | POA: Diagnosis present

## 2017-10-14 DIAGNOSIS — L2389 Allergic contact dermatitis due to other agents: Secondary | ICD-10-CM | POA: Diagnosis not present

## 2017-10-14 DIAGNOSIS — Z79899 Other long term (current) drug therapy: Secondary | ICD-10-CM | POA: Diagnosis not present

## 2017-10-14 DIAGNOSIS — B9789 Other viral agents as the cause of diseases classified elsewhere: Secondary | ICD-10-CM | POA: Insufficient documentation

## 2017-10-14 MED ORDER — PREDNISONE 20 MG PO TABS
60.0000 mg | ORAL_TABLET | Freq: Once | ORAL | Status: AC
Start: 2017-10-14 — End: 2017-10-14
  Administered 2017-10-14: 60 mg via ORAL
  Filled 2017-10-14: qty 3

## 2017-10-14 MED ORDER — BENZONATATE 100 MG PO CAPS: 100.0000 mg | ORAL_CAPSULE | Freq: Three times a day (TID) | ORAL | 0 refills | Status: DC | PRN

## 2017-10-14 MED ORDER — PREDNISONE 10 MG (21) PO TBPK: ORAL_TABLET | ORAL | 0 refills | Status: DC

## 2017-10-14 MED ORDER — AZITHROMYCIN 250 MG PO TABS: ORAL_TABLET | ORAL | 0 refills | Status: DC

## 2017-10-14 MED ORDER — TRIAMCINOLONE ACETONIDE 0.5 % EX OINT
1.0000 | TOPICAL_OINTMENT | Freq: Two times a day (BID) | CUTANEOUS | 0 refills | Status: DC
Start: 2017-10-14 — End: 2021-05-19

## 2017-10-14 NOTE — Discharge Instructions (Signed)
Your chest x-ray was negative. You have symptoms consistent with a viral respiratory infection with cough. Take the prescription meds as directed. Drink plenty of fluids to prevent dehydration. Monitor your blood sugars and adjust your insulin accordingly. You will likely experience some increase due to the steroids. Follow-up with your provider or return as needed.

## 2017-10-14 NOTE — ED Provider Notes (Signed)
[facility] Emergency Department Provider Note ____________________________________________  Time seen: 2134  I have reviewed the triage vital signs and the nursing notes.  HISTORY  Chief Complaint  Cough  HPI [name] is a 56 y.o. female presents to the ED for evaluation of intermittent cough since November, and is also complaining of some sinus congestion.  She has also noted some hoarseness to her throat over the last few days, intermittently.  She denies any interim fevers, chills, or sweats.  She reports a nonproductive cough that is persistent.  She has been taking Robitussin over-the-counter with limited benefit.  She also notes some scarring itching to several fleabites to her extremities and torso.  She has sensitive skin and does note significant itch response to insect bites.  She knows that all the lesions are dried and scabbed at this time.  Past Medical History:  Diagnosis Date  . Diabetes mellitus without complication ([place])   . Fibromyalgia   . Hypertension     There are no active problems to display for this patient.   Past Surgical History:  Procedure Laterality Date  . KNEE ARTHROSCOPY Bilateral     Prior to Admission medications   Medication Sig Start Date End Date Taking? Authorizing Provider  azithromycin (ZITHROMAX Z-PAK) 250 MG tablet Take 2 tablets (500 mg) on  Day 1,  followed by 1 tablet (250 mg) once daily on Days 2 through 5. 10/14/17   Kysa Calais, Dannielle Karvonen, PA-C  benzonatate (TESSALON PERLES) 100 MG capsule Take 1 capsule (100 mg total) by mouth 3 (three) times daily as needed for cough (Take 1-2 per dose). 10/14/17   Darrell Leonhardt, Dannielle Karvonen, PA-C  chlorpheniramine-HYDROcodone (TUSSIONEX PENNKINETIC ER) 10-8 MG/5ML SUER Take 5 mLs by mouth 2 (two) times daily. 09/13/16   Sable Feil, PA-C  fluconazole (DIFLUCAN) 150 MG tablet Take 1 tablet (150 mg total) by mouth daily. 09/13/16   Sable Feil, PA-C   predniSONE (STERAPRED UNI-PAK 21 TAB) 10 MG (21) TBPK tablet 6-day taper as directed. 10/14/17   Loredana Medellin, Dannielle Karvonen, PA-C  sulfamethoxazole-trimethoprim (BACTRIM DS,SEPTRA DS) 800-160 MG tablet Take 1 tablet by mouth 2 (two) times daily. 09/13/16   Sable Feil, PA-C  triamcinolone ointment (KENALOG) 0.5 % Apply 1 application topically 2 (two) times daily. 10/14/17   Keyston Ardolino, Dannielle Karvonen, PA-C    Allergies Ibuprofen and Penicillins  No family history on file.  Social History Social History   Tobacco Use  . Smoking status: Current Every Day Smoker    Packs/day: 0.50    Types: Cigarettes  . Smokeless tobacco: Never Used  Substance Use Topics  . Alcohol use: No  . Drug use: Not on file    Review of Systems  Constitutional: Negative for fever. Eyes: Negative for visual changes. ENT: Negative for sore throat. Cardiovascular: Negative for chest pain. Respiratory: Negative for shortness of breath.  Reports cough as above. Gastrointestinal: Negative for abdominal pain, vomiting and diarrhea. Genitourinary: Negative for dysuria. Musculoskeletal: Negative for back pain. Skin: Negative for rash.  Reports allergic reaction to flea bites as noted. Neurological: Negative for headaches, focal weakness or numbness. ____________________________________________  PHYSICAL EXAM:  VITAL SIGNS: ED Triage Vitals  Enc Vitals Group     BP 10/14/17 2016 (!) 175/84     Pulse Rate 10/14/17 2015 (!) 108     Resp 10/14/17 2015 18     Temp 10/14/17 2015 98.6 F (37 C)     Temp  Source 10/14/17 2015 Oral     SpO2 10/14/17 2015 96 %     Weight 10/14/17 2015 190 lb (86.2 kg)     Height 10/14/17 2015 5\' 4"  (1.626 m)     Head Circumference --      Peak Flow --      Pain Score 10/14/17 2014 9     Pain Loc --      Pain Edu? --      Excl. in [place]? --     Constitutional: Alert and oriented. Well appearing and in no distress. Head: Normocephalic and atraumatic. Eyes: Conjunctivae are  normal. PERRL. Normal extraocular movements Ears: Canals clear. TMs intact bilaterally. Nose: No congestion/rhinorrhea/epistaxis.  Nasal turbinates are edematous, erythematous, slightly enlarged. Mouth/Throat: Mucous membranes are moist.  It is midline and tonsils are flat.  No oropharyngeal lesions are appreciated. Neck: Supple. No thyromegaly. Hematological/Lymphatic/Immunological: No cervical lymphadenopathy. Cardiovascular: Normal rate, regular rhythm. Normal distal pulses. Respiratory: Normal respiratory effort. No wheezes/rales/rhonchi. Gastrointestinal: Soft and nontender. No distention. Musculoskeletal: Nontender with normal range of motion in all extremities.  Neurologic:  Normal gait without ataxia. Normal speech and language. No gross focal neurologic deficits are appreciated. Skin:  Skin is warm, dry and intact. No rash noted.  Patient with multiple hypertrophic and scab lesions over her extremities and torso. ____________________________________________   RADIOLOGY  CXR  IMPRESSION: No active cardiopulmonary disease. ____________________________________________  PROCEDURES  Procedures Prednisone 60 mg PO ____________________________________________  INITIAL IMPRESSION / ASSESSMENT AND PLAN / ED COURSE  Patient with ED evaluation of a persistent, intermittent cough for the last 4+ weeks.  Patient denies any interim fevers, chills, sweats, or chest pain.  She has a history of bronchitis and is a current everyday smoker.  She has had no benefit with over-the-counter Robitussin and her daily allergy medicines and Flonase.  She is reassured by her exam and negative chest x-ray at this time.  She will be discharged with prescriptions for Tessalon Perles, prednisone, Z-Pak, and triamcinolone ointment.  She has taken prednisone in the past and understands the implications to her blood sugars which are typically below 150.  She will monitor and adjust her insulin as necessary.  She  is advised to monitor symptoms and return to the ED or primary care provider as needed. ____________________________________________  FINAL CLINICAL IMPRESSION(S) / ED DIAGNOSES  Final diagnoses:  Viral URI with cough  Allergic contact dermatitis due to other agents      Carmie End, Dannielle Karvonen, PA-C 10/14/17 2213    Schuyler Amor, MD 10/17/17 1115

## 2017-10-14 NOTE — ED Notes (Signed)
Pt states cough since November, reports sometimes it is productive. Pt states she was put on an antibiotic without relief. Pt also reports being a smoker. Pt states hx of pneumonia in the beginning of this year as well. Pt states nasal drainage, denies fever.

## 2017-10-14 NOTE — ED Triage Notes (Signed)
Pt in with co cough since November, also co congestion.

## 2018-03-29 ENCOUNTER — Other Ambulatory Visit: Payer: Self-pay

## 2018-03-29 ENCOUNTER — Encounter: Payer: Self-pay | Admitting: Emergency Medicine

## 2018-03-29 DIAGNOSIS — L03113 Cellulitis of right upper limb: Secondary | ICD-10-CM | POA: Diagnosis not present

## 2018-03-29 DIAGNOSIS — M25521 Pain in right elbow: Secondary | ICD-10-CM | POA: Diagnosis present

## 2018-03-29 DIAGNOSIS — Z79899 Other long term (current) drug therapy: Secondary | ICD-10-CM | POA: Diagnosis not present

## 2018-03-29 DIAGNOSIS — E119 Type 2 diabetes mellitus without complications: Secondary | ICD-10-CM | POA: Insufficient documentation

## 2018-03-29 DIAGNOSIS — I10 Essential (primary) hypertension: Secondary | ICD-10-CM | POA: Insufficient documentation

## 2018-03-29 DIAGNOSIS — F17210 Nicotine dependence, cigarettes, uncomplicated: Secondary | ICD-10-CM | POA: Insufficient documentation

## 2018-03-29 LAB — COMPREHENSIVE METABOLIC PANEL
ALT: 20 U/L (ref 14–54)
AST: 28 U/L (ref 15–41)
Albumin: 4.1 g/dL (ref 3.5–5.0)
Alkaline Phosphatase: 97 U/L (ref 38–126)
Anion gap: 9 (ref 5–15)
BUN: 13 mg/dL (ref 6–20)
CALCIUM: 8.8 mg/dL — AB (ref 8.9–10.3)
CHLORIDE: 104 mmol/L (ref 101–111)
CO2: 25 mmol/L (ref 22–32)
CREATININE: 0.95 mg/dL (ref 0.44–1.00)
Glucose, Bld: 291 mg/dL — ABNORMAL HIGH (ref 65–99)
Potassium: 3.1 mmol/L — ABNORMAL LOW (ref 3.5–5.1)
SODIUM: 138 mmol/L (ref 135–145)
Total Bilirubin: 0.3 mg/dL (ref 0.3–1.2)
Total Protein: 7.8 g/dL (ref 6.5–8.1)

## 2018-03-29 NOTE — ED Triage Notes (Signed)
Pt to triage via w/c with no distress noted; st ?bite to right elbow last night; swelling/pain noted

## 2018-03-30 ENCOUNTER — Emergency Department
Admission: EM | Admit: 2018-03-30 | Discharge: 2018-03-30 | Disposition: A | Discharge status: Home / Self Care | Payer: Medicare HMO | Attending: Emergency Medicine | Admitting: Emergency Medicine

## 2018-03-30 DIAGNOSIS — L03113 Cellulitis of right upper limb: Secondary | ICD-10-CM

## 2018-03-30 LAB — CBC WITH DIFFERENTIAL/PLATELET
Basophils Absolute: 0.1 10*3/uL (ref 0–0.1)
Basophils Relative: 1 %
EOS ABS: 0.2 10*3/uL (ref 0–0.7)
EOS PCT: 2 %
HCT: 40.2 % (ref 35.0–47.0)
Hemoglobin: 13.9 g/dL (ref 12.0–16.0)
LYMPHS ABS: 4 10*3/uL — AB (ref 1.0–3.6)
Lymphocytes Relative: 51 %
MCH: 31.9 pg (ref 26.0–34.0)
MCHC: 34.6 g/dL (ref 32.0–36.0)
MCV: 92.3 fL (ref 80.0–100.0)
Monocytes Absolute: 0.7 10*3/uL (ref 0.2–0.9)
Monocytes Relative: 8 %
Neutro Abs: 3.1 10*3/uL (ref 1.4–6.5)
Neutrophils Relative %: 38 %
PLATELETS: 305 10*3/uL (ref 150–440)
RBC: 4.35 MIL/uL (ref 3.80–5.20)
RDW: 13.7 % (ref 11.5–14.5)
WBC: 8.1 10*3/uL (ref 3.6–11.0)

## 2018-03-30 LAB — LACTIC ACID, PLASMA: Lactic Acid, Venous: 2 mmol/L (ref 0.5–1.9)

## 2018-03-30 MED ORDER — CLINDAMYCIN HCL 150 MG PO CAPS
300.0000 mg | ORAL_CAPSULE | Freq: Once | ORAL | Status: AC
  Administered 2018-03-30: 300 mg via ORAL
  Filled 2018-03-30: qty 2

## 2018-03-30 MED ORDER — TRAMADOL HCL 50 MG PO TABS
50.0000 mg | ORAL_TABLET | Freq: Once | ORAL | Status: AC
Start: 2018-03-30 — End: 2018-03-30
  Administered 2018-03-30: 50 mg via ORAL
  Filled 2018-03-30: qty 1

## 2018-03-30 MED ORDER — TRAMADOL HCL 50 MG PO TABS: 50.0000 mg | ORAL_TABLET | Freq: Four times a day (QID) | ORAL | 0 refills | Status: DC | PRN

## 2018-03-30 MED ORDER — CLINDAMYCIN HCL 300 MG PO CAPS: 300.0000 mg | ORAL_CAPSULE | Freq: Three times a day (TID) | ORAL | 0 refills | Status: AC

## 2018-03-30 NOTE — Discharge Instructions (Addendum)
Please follow up with your primary care physician for further evaluation of your cellulitis. Please return with any high fevers, vomiting, worsening pain or worsening redness of your arm.

## 2018-03-30 NOTE — ED Provider Notes (Signed)
[facility] Emergency Department Provider Note   ____________________________________________   First MD Initiated Contact with Patient 03/30/18 [phone]     (approximate)  I have reviewed the triage vital signs and the nursing notes.   HISTORY  Chief Complaint Insect Bite    HPI Anne Brennan is a 57 y.o. female who comes into the hospital today stating that something bit her on her right elbow.  The patient reports that she has redness to her right elbow that started approximately 2 nights ago.  The patient has had some fevers and the area is painful.  The patient significant other states that she had some low-grade temperatures here in the emergency department.  The patient rates her pain a 9 out of 10 in intensity.  She did not take anything for pain at home but she did take a dose of amoxicillin.  The patient decided to come in for further evaluation.   Past Medical History:  Diagnosis Date  . Diabetes mellitus without complication ([place])   . Fibromyalgia   . Hypertension     There are no active problems to display for this patient.   Past Surgical History:  Procedure Laterality Date  . KNEE ARTHROSCOPY Bilateral     Prior to Admission medications   Medication Sig Start Date End Date Taking? Authorizing Provider  azithromycin (ZITHROMAX Z-PAK) 250 MG tablet Take 2 tablets (500 mg) on  Day 1,  followed by 1 tablet (250 mg) once daily on Days 2 through 5. 10/14/17   Menshew, Dannielle Karvonen, PA-C  benzonatate (TESSALON PERLES) 100 MG capsule Take 1 capsule (100 mg total) by mouth 3 (three) times daily as needed for cough (Take 1-2 per dose). 10/14/17   Menshew, Dannielle Karvonen, PA-C  chlorpheniramine-HYDROcodone (TUSSIONEX PENNKINETIC ER) 10-8 MG/5ML SUER Take 5 mLs by mouth 2 (two) times daily. 09/13/16   Sable Feil, PA-C  clindamycin (CLEOCIN) 300 MG capsule Take 1 capsule (300 mg total) by mouth 3 (three) times daily for 10 days. 03/30/18  04/09/18  Loney Hering, MD  fluconazole (DIFLUCAN) 150 MG tablet Take 1 tablet (150 mg total) by mouth daily. 09/13/16   Sable Feil, PA-C  predniSONE (STERAPRED UNI-PAK 21 TAB) 10 MG (21) TBPK tablet 6-day taper as directed. 10/14/17   Menshew, Dannielle Karvonen, PA-C  sulfamethoxazole-trimethoprim (BACTRIM DS,SEPTRA DS) 800-160 MG tablet Take 1 tablet by mouth 2 (two) times daily. 09/13/16   Sable Feil, PA-C  traMADol (ULTRAM) 50 MG tablet Take 1 tablet (50 mg total) by mouth every 6 (six) hours as needed. 03/30/18   Loney Hering, MD  triamcinolone ointment (KENALOG) 0.5 % Apply 1 application topically 2 (two) times daily. 10/14/17   Menshew, Dannielle Karvonen, PA-C    Allergies Ibuprofen and Penicillins  No family history on file.  Social History Social History   Tobacco Use  . Smoking status: Current Every Day Smoker    Packs/day: 0.50    Types: Cigarettes  . Smokeless tobacco: Never Used  Substance Use Topics  . Alcohol use: No  . Drug use: Not on file    Review of Systems  Constitutional: No fever/chills Eyes: No visual changes. ENT: No sore throat. Cardiovascular: Denies chest pain. Respiratory: Denies shortness of breath. Gastrointestinal: No abdominal pain.  No nausea, no vomiting.  No diarrhea.  No constipation. Genitourinary: Negative for dysuria. Musculoskeletal: Negative for back pain. Skin: redness to left elbow Neurological: Negative for headaches, focal weakness  or numbness.   ____________________________________________   PHYSICAL EXAM:  VITAL SIGNS: ED Triage Vitals  Enc Vitals Group     BP 03/29/18 2320 (!) 163/69     Pulse Rate 03/29/18 2320 89     Resp 03/29/18 2320 18     Temp 03/29/18 2320 99.1 F (37.3 C)     Temp Source 03/29/18 2320 Oral     SpO2 03/29/18 2320 96 %     Weight 03/29/18 2257 190 lb (86.2 kg)     Height 03/29/18 2257 5\' 4"  (1.626 m)     Head Circumference --      Peak Flow --      Pain Score 03/29/18 2257  9     Pain Loc --      Pain Edu? --      Excl. in [place]? --     Constitutional: Alert and oriented. Well appearing and in mild distress. Eyes: Conjunctivae are normal. PERRL. EOMI. Head: Atraumatic. Nose: No congestion/rhinnorhea. Mouth/Throat: Mucous membranes are moist.  Oropharynx non-erythematous. Cardiovascular: Normal rate, regular rhythm. Grossly normal heart sounds.  Good peripheral circulation. Respiratory: Normal respiratory effort.  No retractions. Lungs CTAB. Gastrointestinal: Soft and nontender. No distention. Positive bowel sounds Musculoskeletal: No lower extremity tenderness nor edema.   Neurologic:  Normal speech and language.  Skin:  Skin is warm, dry and intact. Mild erythema to posterior elbow, mild swelling with no blisters and no crepitus Psychiatric: Mood and affect are normal.   ____________________________________________   LABS (all labs ordered are listed, but only abnormal results are displayed)  Labs Reviewed  CBC WITH DIFFERENTIAL/PLATELET - Abnormal; Notable for the following components:      Result Value   Lymphs Abs 4.0 (*)    All other components within normal limits  COMPREHENSIVE METABOLIC PANEL - Abnormal; Notable for the following components:   Potassium 3.1 (*)    Glucose, Bld 291 (*)    Calcium 8.8 (*)    All other components within normal limits  LACTIC ACID, PLASMA - Abnormal; Notable for the following components:   Lactic Acid, Venous 2.0 (*)    All other components within normal limits  CULTURE, BLOOD (ROUTINE X 2)  CULTURE, BLOOD (ROUTINE X 2)  LACTIC ACID, PLASMA   ____________________________________________  EKG  none ____________________________________________  RADIOLOGY  ED MD interpretation:  none  Official radiology report(s): No results found.  ____________________________________________   PROCEDURES  Procedure(s) performed: None  Procedures  Critical Care performed:  No  ____________________________________________   INITIAL IMPRESSION / ASSESSMENT AND PLAN / ED COURSE  As part of my medical decision making, I reviewed the following data within the electronic MEDICAL RECORD NUMBER Notes from prior ED visits and [place] Controlled Substance Database   This is a 57 year old female who comes into the hospital today with some redness to her posterior elbow.  The patient states that she thinks she had an insect bite that caused the redness.  After evaluating the patient it appears that she does have some cellulitis.  They did check some blood work on the patient and her white blood cell count is normal and her glucose is 291.  The patient did have a lactic acid that was 2.0 but I feel that may be also due to her glucose.  As the patient is not febrile I will give the patient a dose of tramadol for pain and some clindamycin.  She will be discharged home to follow-up with her primary care physician.  ____________________________________________   FINAL CLINICAL IMPRESSION(S) / ED DIAGNOSES  Final diagnoses:  Cellulitis of right upper extremity     ED Discharge Orders        Ordered    clindamycin (CLEOCIN) 300 MG capsule  3 times daily     03/30/18 0348    traMADol (ULTRAM) 50 MG tablet  Every 6 hours PRN     03/30/18 0348       Note:  This document was prepared using Dragon voice recognition software and may include unintentional dictation errors.    Loney Hering, MD 03/30/18 [phone]

## 2018-03-30 NOTE — ED Notes (Signed)
Charge nurse notif of pt's lactic acid results

## 2018-04-03 LAB — CULTURE, BLOOD (ROUTINE X 2)
Culture: NO GROWTH
Special Requests: ADEQUATE

## 2020-10-11 ENCOUNTER — Other Ambulatory Visit: Payer: Self-pay | Admitting: Internal Medicine

## 2020-10-11 DIAGNOSIS — Z1231 Encounter for screening mammogram for malignant neoplasm of breast: Secondary | ICD-10-CM

## 2021-04-19 DIAGNOSIS — I639 Cerebral infarction, unspecified: Secondary | ICD-10-CM

## 2021-04-19 HISTORY — DX: Cerebral infarction, unspecified: I63.9

## 2021-04-26 ENCOUNTER — Other Ambulatory Visit: Payer: Self-pay | Admitting: Internal Medicine

## 2021-04-26 ENCOUNTER — Other Ambulatory Visit (HOSPITAL_COMMUNITY): Payer: Self-pay | Admitting: Internal Medicine

## 2021-04-26 DIAGNOSIS — R053 Chronic cough: Secondary | ICD-10-CM

## 2021-05-19 ENCOUNTER — Observation Stay
Admission: EM | Admit: 2021-05-19 | Discharge: 2021-05-20 | Disposition: A | Discharge status: Home / Self Care | Payer: Medicare HMO | Attending: Internal Medicine | Admitting: Internal Medicine

## 2021-05-19 ENCOUNTER — Other Ambulatory Visit: Payer: Self-pay

## 2021-05-19 ENCOUNTER — Observation Stay: Payer: Medicare HMO

## 2021-05-19 ENCOUNTER — Emergency Department: Payer: Medicare HMO

## 2021-05-19 DIAGNOSIS — R29810 Facial weakness: Secondary | ICD-10-CM | POA: Diagnosis present

## 2021-05-19 DIAGNOSIS — I10 Essential (primary) hypertension: Secondary | ICD-10-CM | POA: Diagnosis present

## 2021-05-19 DIAGNOSIS — Z7984 Long term (current) use of oral hypoglycemic drugs: Secondary | ICD-10-CM | POA: Diagnosis not present

## 2021-05-19 DIAGNOSIS — Z8673 Personal history of transient ischemic attack (TIA), and cerebral infarction without residual deficits: Secondary | ICD-10-CM | POA: Diagnosis not present

## 2021-05-19 DIAGNOSIS — F17200 Nicotine dependence, unspecified, uncomplicated: Secondary | ICD-10-CM | POA: Diagnosis present

## 2021-05-19 DIAGNOSIS — F17210 Nicotine dependence, cigarettes, uncomplicated: Secondary | ICD-10-CM | POA: Insufficient documentation

## 2021-05-19 DIAGNOSIS — E119 Type 2 diabetes mellitus without complications: Secondary | ICD-10-CM

## 2021-05-19 DIAGNOSIS — G459 Transient cerebral ischemic attack, unspecified: Principal | ICD-10-CM | POA: Insufficient documentation

## 2021-05-19 DIAGNOSIS — Z79899 Other long term (current) drug therapy: Secondary | ICD-10-CM | POA: Diagnosis not present

## 2021-05-19 DIAGNOSIS — Z20822 Contact with and (suspected) exposure to covid-19: Secondary | ICD-10-CM | POA: Insufficient documentation

## 2021-05-19 DIAGNOSIS — M797 Fibromyalgia: Secondary | ICD-10-CM | POA: Diagnosis present

## 2021-05-19 DIAGNOSIS — J449 Chronic obstructive pulmonary disease, unspecified: Secondary | ICD-10-CM | POA: Diagnosis present

## 2021-05-19 DIAGNOSIS — F32A Depression, unspecified: Secondary | ICD-10-CM | POA: Diagnosis present

## 2021-05-19 DIAGNOSIS — I639 Cerebral infarction, unspecified: Secondary | ICD-10-CM

## 2021-05-19 LAB — COMPREHENSIVE METABOLIC PANEL
ALT: 14 U/L (ref 0–44)
AST: 16 U/L (ref 15–41)
Albumin: 3.8 g/dL (ref 3.5–5.0)
Alkaline Phosphatase: 102 U/L (ref 38–126)
Anion gap: 9 (ref 5–15)
BUN: 12 mg/dL (ref 6–20)
CO2: 28 mmol/L (ref 22–32)
Calcium: 9.3 mg/dL (ref 8.9–10.3)
Chloride: 100 mmol/L (ref 98–111)
Creatinine, Ser: 0.71 mg/dL (ref 0.44–1.00)
GFR, Estimated: >60 mL/min (ref >60)
Glucose, Bld: 234 mg/dL — ABNORMAL HIGH (ref 70–99)
Potassium: 3.6 mmol/L (ref 3.5–5.1)
Sodium: 137 mmol/L (ref 135–145)
Total Bilirubin: 0.4 mg/dL (ref 0.3–1.2)
Total Protein: 7.8 g/dL (ref 6.5–8.1)

## 2021-05-19 LAB — DIFFERENTIAL
Abs Immature Granulocytes: 0.02 10*3/uL (ref 0.00–0.07)
Basophils Absolute: 0 10*3/uL (ref 0.0–0.1)
Basophils Relative: 1 %
Eosinophils Absolute: 0.1 10*3/uL (ref 0.0–0.5)
Eosinophils Relative: 2 %
Immature Granulocytes: 0 %
Lymphocytes Relative: 51 %
Lymphs Abs: 4 10*3/uL (ref 0.7–4.0)
Monocytes Absolute: 0.7 10*3/uL (ref 0.1–1.0)
Monocytes Relative: 10 %
Neutro Abs: 2.7 10*3/uL (ref 1.7–7.7)
Neutrophils Relative %: 36 %
Smear Review: NORMAL

## 2021-05-19 LAB — CBC
HCT: 42.4 % (ref 36.0–46.0)
HCT: 41.7 % (ref 36.0–46.0)
Hemoglobin: 14.5 g/dL (ref 12.0–15.0)
Hemoglobin: 14.4 g/dL (ref 12.0–15.0)
MCH: 31.1 pg (ref 26.0–34.0)
MCH: 31.2 pg (ref 26.0–34.0)
MCHC: 34.2 g/dL (ref 30.0–36.0)
MCHC: 34.5 g/dL (ref 30.0–36.0)
MCV: 91 fL (ref 80.0–100.0)
MCV: 90.5 fL (ref 80.0–100.0)
Platelets: 253 10*3/uL (ref 150–400)
Platelets: 255 10*3/uL (ref 150–400)
RBC: 4.66 MIL/uL (ref 3.87–5.11)
RBC: 4.61 MIL/uL (ref 3.87–5.11)
RDW: 12.6 % (ref 11.5–15.5)
RDW: 12.6 % (ref 11.5–15.5)
WBC: 7.6 10*3/uL (ref 4.0–10.5)
WBC: 6.8 10*3/uL (ref 4.0–10.5)
nRBC: 0 % (ref 0.0–0.2)
nRBC: 0 % (ref 0.0–0.2)

## 2021-05-19 LAB — PROTIME-INR
INR: 1 (ref 0.8–1.2)
Prothrombin Time: 12.7 seconds (ref 11.4–15.2)

## 2021-05-19 LAB — RESP PANEL BY RT-PCR (FLU A&B, COVID) ARPGX2
Influenza A by PCR: NEGATIVE
Influenza B by PCR: NEGATIVE
SARS Coronavirus 2 by RT PCR: NEGATIVE

## 2021-05-19 LAB — TSH: TSH: 1.268 u[IU]/mL (ref 0.350–4.500)

## 2021-05-19 LAB — CREATININE, SERUM
Creatinine, Ser: 0.76 mg/dL (ref 0.44–1.00)
GFR, Estimated: >60 mL/min (ref >60)

## 2021-05-19 LAB — GLUCOSE, CAPILLARY: Glucose-Capillary: 267 mg/dL — ABNORMAL HIGH (ref 70–99)

## 2021-05-19 LAB — CBG MONITORING, ED: Glucose-Capillary: 225 mg/dL — ABNORMAL HIGH (ref 70–99)

## 2021-05-19 LAB — APTT: aPTT: 29 seconds (ref 24–36)

## 2021-05-19 MED ORDER — ONDANSETRON HCL 4 MG/2ML IJ SOLN: 4.0000 mg | Freq: Four times a day (QID) | INTRAMUSCULAR | Status: DC | PRN

## 2021-05-19 MED ORDER — METOPROLOL SUCCINATE ER 50 MG PO TB24
50.0000 mg | ORAL_TABLET | Freq: Every day | ORAL | Status: DC
  Administered 2021-05-19 – 2021-05-20 (×2): 50 mg via ORAL
  Filled 2021-05-19 (×2): qty 1

## 2021-05-19 MED ORDER — ACETAMINOPHEN 325 MG PO TABS
650.0000 mg | ORAL_TABLET | Freq: Four times a day (QID) | ORAL | Status: DC | PRN
  Administered 2021-05-19: 21:00:00 650 mg via ORAL
  Filled 2021-05-19: qty 2

## 2021-05-19 MED ORDER — LORAZEPAM 2 MG/ML IJ SOLN: 0.5000 mg | INTRAMUSCULAR | Status: DC

## 2021-05-19 MED ORDER — LACTATED RINGERS IV SOLN: INTRAVENOUS | Status: DC

## 2021-05-19 MED ORDER — INSULIN ASPART 100 UNIT/ML IJ SOLN
0.0000 [IU] | Freq: Every day | INTRAMUSCULAR | Status: DC
  Administered 2021-05-19: 22:00:00 3 [IU] via SUBCUTANEOUS
  Filled 2021-05-19: qty 1

## 2021-05-19 MED ORDER — INSULIN ASPART 100 UNIT/ML IJ SOLN
0.0000 [IU] | Freq: Three times a day (TID) | INTRAMUSCULAR | Status: DC
  Administered 2021-05-20 (×2): 5 [IU] via SUBCUTANEOUS
  Filled 2021-05-19 (×2): qty 1

## 2021-05-19 MED ORDER — POLYETHYLENE GLYCOL 3350 17 G PO PACK: 17.0000 g | PACK | Freq: Every day | ORAL | Status: DC | PRN

## 2021-05-19 MED ORDER — SODIUM CHLORIDE 0.9% FLUSH
3.0000 mL | Freq: Once | INTRAVENOUS | Status: AC
Start: 2021-05-19 — End: 2021-05-19
  Administered 2021-05-19: 3 mL via INTRAVENOUS

## 2021-05-19 MED ORDER — ONDANSETRON HCL 4 MG PO TABS: 4.0000 mg | ORAL_TABLET | Freq: Four times a day (QID) | ORAL | Status: DC | PRN

## 2021-05-19 MED ORDER — PANTOPRAZOLE SODIUM 40 MG PO TBEC
80.0000 mg | DELAYED_RELEASE_TABLET | Freq: Every day | ORAL | Status: DC
  Administered 2021-05-19 – 2021-05-20 (×2): 80 mg via ORAL
  Filled 2021-05-19 (×2): qty 2

## 2021-05-19 MED ORDER — ASPIRIN 300 MG RE SUPP
300.0000 mg | Freq: Every day | RECTAL | Status: DC
  Filled 2021-05-19: qty 1

## 2021-05-19 MED ORDER — ASPIRIN 325 MG PO TABS
325.0000 mg | ORAL_TABLET | Freq: Every day | ORAL | Status: DC
  Administered 2021-05-20: 08:00:00 325 mg via ORAL
  Filled 2021-05-19 (×3): qty 1

## 2021-05-19 MED ORDER — CITALOPRAM HYDROBROMIDE 20 MG PO TABS
20.0000 mg | ORAL_TABLET | Freq: Every day | ORAL | Status: DC
  Administered 2021-05-19 – 2021-05-20 (×2): 20 mg via ORAL
  Filled 2021-05-19 (×2): qty 1

## 2021-05-19 MED ORDER — STROKE: EARLY STAGES OF RECOVERY BOOK: Freq: Once | Status: AC

## 2021-05-19 MED ORDER — ENOXAPARIN SODIUM 40 MG/0.4ML IJ SOSY
40.0000 mg | PREFILLED_SYRINGE | INTRAMUSCULAR | Status: DC
  Administered 2021-05-19: 40 mg via SUBCUTANEOUS
  Filled 2021-05-19: qty 0.4

## 2021-05-19 MED ORDER — PRAVASTATIN SODIUM 20 MG PO TABS
20.0000 mg | ORAL_TABLET | Freq: Every day | ORAL | Status: DC
  Administered 2021-05-19 – 2021-05-20 (×2): 20 mg via ORAL
  Filled 2021-05-19: qty 1

## 2021-05-19 MED ORDER — ACETAMINOPHEN 650 MG RE SUPP: 650.0000 mg | Freq: Four times a day (QID) | RECTAL | Status: DC | PRN

## 2021-05-19 MED ORDER — HYDRALAZINE HCL 50 MG PO TABS: 25.0000 mg | ORAL_TABLET | Freq: Three times a day (TID) | ORAL | Status: DC | PRN

## 2021-05-19 MED ORDER — TRAZODONE HCL 50 MG PO TABS
100.0000 mg | ORAL_TABLET | Freq: Every day | ORAL | Status: DC
  Administered 2021-05-19: 23:00:00 100 mg via ORAL
  Filled 2021-05-19: qty 2

## 2021-05-19 MED ORDER — MONTELUKAST SODIUM 10 MG PO TABS
10.0000 mg | ORAL_TABLET | Freq: Every day | ORAL | Status: DC
  Administered 2021-05-19: 21:00:00 10 mg via ORAL
  Filled 2021-05-19 (×2): qty 1

## 2021-05-19 MED ORDER — GABAPENTIN 300 MG PO CAPS
300.0000 mg | ORAL_CAPSULE | Freq: Three times a day (TID) | ORAL | Status: DC
  Administered 2021-05-19 – 2021-05-20 (×2): 300 mg via ORAL
  Filled 2021-05-19 (×2): qty 1

## 2021-05-19 NOTE — Progress Notes (Signed)
Pt off unit for MRI

## 2021-05-19 NOTE — ED Provider Notes (Signed)
[facility] Emergency Department Provider Note   ____________________________________________    I have reviewed the triage vital signs and the nursing notes.   HISTORY  Chief Complaint Facial Droop     HPI Anne Brennan is a 60 y.o. female with history of diabetes, high blood pressure who presents with left facial droop and headache x2 days.  Patient reports she has had frontal headache x2 days, described as throbbing in nature.  She reports her daughter told her that she has a facial droop on the left side 2 days ago but patient at that time refused to go to the hospital.  She reports that since it has not improved she decided to come.  She does report that she had an injury to her nose where something fell out of a closet and bumped her nose.  But headache had started prior to the  Past Medical History:  Diagnosis Date   Diabetes mellitus without complication ([place])    Fibromyalgia    Hypertension     Patient Active Problem List   Diagnosis Date Noted   TIA (transient ischemic attack) 05/19/2021    Past Surgical History:  Procedure Laterality Date   KNEE ARTHROSCOPY Bilateral     Prior to Admission medications   Medication Sig Start Date End Date Taking? Authorizing Provider  azithromycin (ZITHROMAX Z-PAK) 250 MG tablet Take 2 tablets (500 mg) on  Day 1,  followed by 1 tablet (250 mg) once daily on Days 2 through 5. 10/14/17   Menshew, Dannielle Karvonen, PA-C  benzonatate (TESSALON PERLES) 100 MG capsule Take 1 capsule (100 mg total) by mouth 3 (three) times daily as needed for cough (Take 1-2 per dose). 10/14/17   Menshew, Dannielle Karvonen, PA-C  chlorpheniramine-HYDROcodone (TUSSIONEX PENNKINETIC ER) 10-8 MG/5ML SUER Take 5 mLs by mouth 2 (two) times daily. 09/13/16   Sable Feil, PA-C  fluconazole (DIFLUCAN) 150 MG tablet Take 1 tablet (150 mg total) by mouth daily. 09/13/16   Sable Feil, PA-C  predniSONE (STERAPRED UNI-PAK 21  TAB) 10 MG (21) TBPK tablet 6-day taper as directed. 10/14/17   Menshew, Dannielle Karvonen, PA-C  sulfamethoxazole-trimethoprim (BACTRIM DS,SEPTRA DS) 800-160 MG tablet Take 1 tablet by mouth 2 (two) times daily. 09/13/16   Sable Feil, PA-C  traMADol (ULTRAM) 50 MG tablet Take 1 tablet (50 mg total) by mouth every 6 (six) hours as needed. 03/30/18   Loney Hering, MD  triamcinolone ointment (KENALOG) 0.5 % Apply 1 application topically 2 (two) times daily. 10/14/17   Menshew, Dannielle Karvonen, PA-C     Allergies Ibuprofen and Penicillins  No family history on file.  Social History Social History   Tobacco Use   Smoking status: Every Day    Packs/day: 0.50    Types: Cigarettes   Smokeless tobacco: Never  Substance Use Topics   Alcohol use: No    Review of Systems  Constitutional: No fever/chills Eyes: No visual changes.  ENT: No sore throat. Cardiovascular: Denies chest pain. Respiratory: Denies shortness of breath. Gastrointestinal: No abdominal pain.  No nausea, no vomiting.   Genitourinary: Negative for dysuria. Musculoskeletal: As above Skin: Negative for rash. Neurological: As above   ____________________________________________   PHYSICAL EXAM:  VITAL SIGNS: ED Triage Vitals [05/19/21 1637]  Enc Vitals Group     BP (!) 187/85     Pulse Rate 85     Resp 16     Temp 97.8 F (36.6  C)     Temp Source Oral     SpO2 97 %     Weight 81.2 kg (179 lb)     Height 1.626 m ('5\' 4"'$ )     Head Circumference      Peak Flow      Pain Score 8     Pain Loc      Pain Edu?      Excl. in [place]?     Constitutional: Alert and oriented.  Eyes: Conjunctivae are normal.  PERRLA, EOMI Head: Atraumatic. Nose: No congestion/rhinnorhea. Mouth/Throat: Mucous membranes are moist.  Left facial droop, forehead does not seem to be included Neck:  Painless ROM Cardiovascular: Normal rate, regular rhythm. Grossly normal heart sounds.  Good peripheral circulation. Respiratory:  Normal respiratory effort.  No retractions. Lungs CTAB. Gastrointestinal: Soft and nontender. No distention.    Musculoskeletal: No lower extremity tenderness nor edema.  Warm and well perfused Neurologic:  Normal speech and language.  Left-sided facial droop, forehead not included.  Patient describes tingling to the left side of her tongue as well Skin:  Skin is warm, dry and intact. No rash noted. Psychiatric: Mood and affect are normal. Speech and behavior are normal.  ____________________________________________   LABS (all labs ordered are listed, but only abnormal results are displayed)  Labs Reviewed  COMPREHENSIVE METABOLIC PANEL - Abnormal; Notable for the following components:      Result Value   Glucose, Bld 234 (*)    All other components within normal limits  CBG MONITORING, ED - Abnormal; Notable for the following components:   Glucose-Capillary 225 (*)    All other components within normal limits  SARS CORONAVIRUS 2 (TAT 6-24 HRS)  PROTIME-INR  APTT  CBC  DIFFERENTIAL  POC URINE PREG, ED   ____________________________________________  EKG  ED ECG REPORT I, Lavonia Drafts, the attending physician, personally viewed and interpreted this ECG.  Date: 05/19/2021  Rhythm: normal sinus rhythm QRS Axis: normal Intervals: normal ST/T Wave abnormalities: normal Narrative Interpretation: no evidence of acute ischemia  ____________________________________________  RADIOLOGY  CT head without evidence of acute abnormality ____________________________________________   PROCEDURES  Procedure(s) performed: No  Procedures   Critical Care performed: No ____________________________________________   INITIAL IMPRESSION / ASSESSMENT AND PLAN / ED COURSE  Pertinent labs & imaging results that were available during my care of the patient were reviewed by me and considered in my medical decision making (see chart for details).   Patient presents with left-sided  facial droop and headache x2 days, last known well was 2 days.  Hence the patient is not a candidate for tPA.  She does have a left facial droop on exam, forehead seems to be excluded, suspect central cause  Lab work is overall reassuring, exam consistent with CVA, no abnormality on CT scan, will require admission for MRI, neurology consultation and further evaluation.    ____________________________________________   FINAL CLINICAL IMPRESSION(S) / ED DIAGNOSES  Final diagnoses:  Cerebrovascular accident (CVA), unspecified mechanism ([place])        Note:  This document was prepared using Dragon voice recognition software and may include unintentional dictation errors.    Lavonia Drafts, MD 05/19/21 [phone]

## 2021-05-19 NOTE — ED Notes (Signed)
Pt advised her daughter noticed her slurred speech and facial droop on Wednesday last week. Pt didn't want to come to the ER and sit all day per the pt. But today she decided to come.

## 2021-05-19 NOTE — ED Triage Notes (Signed)
Pt to ER from [facility].   Pt reports feeling badly for over a week. Reports symptoms have included cough/ congestion/ cough/ sore throat. Unknown covid contacts. Reports she has also had a bad headache for two days. Pt also has injury to face, reports large clock fell out of her closet on the left side of her face.   Left sided facial droop noticed by daughter yesterday. Pt didn't notice facial droop until her daughter pointed it out. Denies weakness to extremities but has had some difficulty ambulating and noticed she has been "stumbling".   Unknown LKW.

## 2021-05-19 NOTE — H&P (Signed)
History and Physical    Anne Brennan [id number] DOB: Jun 12, 1961 DOA: 05/19/2021  PCP: Harlow Ohms, MD  Patient coming from: Home  I have personally briefly reviewed patient's old medical records in [place]  Chief Complaint: Headache, left facial droop  HPI: Anne Brennan is a 60 y.o. female with medical history significant of essential hypertension, DM2, HLD, COPD, tobacco use disorder, fibromyalgia, depression who presented to [facility] ED on 7/31 with complaint of headache and left facial droop x2 days.  Patient was encouraged by her daughter to present to the ED at time of initial event, but declined.  Patient also reported bilateral upper extremity weakness but states now her symptoms have resolved.  Patient not interested in staying in the ED for further work-up as she has things to do at home including taking care of her children.  Although she apparently has failed her nursing swallow evaluation in the ED.  Patient no other complaints or concerns at this time.  ED Course: Temperature 97.8 F, HR 79, RR 14, BP 159/76, SPO2 97% on room air.  WC 7.6, hemoglobin 14.5, platelets 253.  Sodium 137, potassium 3.6, chloride 100, CO2 28, BUN 12, creatinine 0.71, glucose 234.  CT head without contrast with no acute intracranial abnormality.  EDP consulted [facility] for admission with concern of CVA versus TIA.  Constitutional - No Fatigue, No Weight Loss Vision - No impaired vision, decreased visual acuity Ear/Nose/Mouth/Throat - No decreased hearing, no congestion Respiratory - No shortness of breath, no exertional dyspnea, chronic cough Cardiovascular - No chest pain, no palpitations, no peripheral edema Gastrointestinal - No nausea, no diarrhea, no constipation, Genitourinary - No excessive urination, no urinary incontinence Integumentary - No rashes or concerning skin lesions Neurologic - No numbness, no tingling, no dizziness, + headaches, + left facial drooping,no confusion  or memory loss   Past Medical History:  Diagnosis Date   Diabetes mellitus without complication ([place])    Fibromyalgia    Hypertension     Past Surgical History:  Procedure Laterality Date   KNEE ARTHROSCOPY Bilateral      reports that she has been smoking cigarettes. She has been smoking an average of .5 packs per day. She has never used smokeless tobacco. She reports that she does not drink alcohol. No history on file for drug use.  Allergies  Allergen Reactions   Ibuprofen Other (See Comments)    N/V/ salivate   Penicillins Rash    No family history on file.  Family history reviewed and not pertinent   Prior to Admission medications   Medication Sig Start Date End Date Taking? Authorizing Provider  amLODipine (NORVASC) 5 MG tablet Take 5 mg by mouth daily. 05/15/21  Yes [provider]  azithromycin (ZITHROMAX Z-PAK) 250 MG tablet Take 2 tablets (500 mg) on  Day 1,  followed by 1 tablet (250 mg) once daily on Days 2 through 5. 10/14/17  Yes Menshew, Dannielle Karvonen, PA-C  citalopram (CELEXA) 20 MG tablet Take 20 mg by mouth daily. 02/03/21  Yes [provider]  cyclobenzaprine (FLEXERIL) 10 MG tablet Take 10 mg by mouth 3 (three) times daily. 01/11/21  Yes [provider]  gabapentin (NEURONTIN) 400 MG capsule Take 800 mg by mouth 3 (three) times daily as needed. 04/30/21  Yes [provider]  HYDROcodone-acetaminophen (NORCO) 10-325 MG tablet Take 1 tablet by mouth every 6 (six) hours as needed for pain. 05/08/21  Yes [provider]  liraglutide Donna Bernard)  18 MG/3ML SOPN Inject 1.5 mg into the skin daily.   Yes [provider]  metFORMIN (GLUCOPHAGE-XR) 500 MG 24 hr tablet Take 1,000 mg by mouth daily. 01/11/21  Yes [provider]  metoprolol succinate (TOPROL-XL) 50 MG 24 hr tablet Take 50 mg by mouth daily. 01/05/21  Yes [provider]  montelukast (SINGULAIR) 10 MG tablet Take 10 mg by mouth at bedtime.  04/26/21  Yes [provider]  NOVOLIN 70/30 (70-30) 100 UNIT/ML injection Inject 44 Units into the skin 2 (two) times daily. 04/29/21  Yes [provider]  omeprazole (PRILOSEC) 20 MG capsule Take 20 mg by mouth 2 (two) times daily. 05/15/21  Yes [provider]  pravastatin (PRAVACHOL) 20 MG tablet Take 20 mg by mouth daily. 01/02/21  Yes [provider]  traZODone (DESYREL) 100 MG tablet Take 100 mg by mouth at bedtime. 04/24/21  Yes [provider]  triamterene-hydrochlorothiazide (MAXZIDE-25) 37.5-25 MG tablet Take 1 tablet by mouth daily. 05/15/21  Yes [provider]  albuterol (VENTOLIN HFA) 108 (90 Base) MCG/ACT inhaler Inhale 2 puffs into the lungs every 4 (four) hours as needed for shortness of breath or wheezing. 04/30/21   [provider]    Physical Exam: Vitals:   05/19/21 1637 05/19/21 1715 05/19/21 1730  BP: (!) 187/85 (!) 150/91 (!) 154/76  Pulse: 85 84 79  Resp: '16 15 14  '$ Temp: 97.8 F (36.6 C)    TempSrc: Oral    SpO2: 97% 100% 97%  Weight: 81.2 kg    Height: '5\' 4"'$  (1.626 m)      Constitutional: NAD, calm, comfortable Eyes: PERRL, lids and conjunctivae normal ENMT: Mucous membranes are moist. Posterior pharynx clear of any exudate or lesions.Normal dentition.  Neck: normal, supple, no masses, no thyromegaly Respiratory: clear to auscultation bilaterally, no wheezing, no crackles. Normal respiratory effort. No accessory muscle use.  Cardiovascular: Regular rate and rhythm, no murmurs / rubs / gallops. No extremity edema. 2+ pedal pulses. No carotid bruits.  Abdomen: no tenderness, no masses palpated. No hepatosplenomegaly. Bowel sounds positive.  Musculoskeletal: no clubbing / cyanosis. No joint deformity upper and lower extremities. Good ROM, no contractures. Normal muscle tone.  Skin: no rashes, lesions, ulcers. No induration Neurologic: CN 2-12 grossly intact. Sensation intact, DTR normal. Strength 5/5 in all  4.  Psychiatric: Normal judgment and insight. Alert and oriented x 3. Normal mood.    Labs on Admission: I have personally reviewed following labs and imaging studies  CBC: Recent Labs  Lab 05/19/21 1640  WBC 7.6  NEUTROABS 2.7  HGB 14.5  HCT 42.4  MCV 91.0  PLT 123456   Basic Metabolic Panel: Recent Labs  Lab 05/19/21 1640  NA 137  K 3.6  CL 100  CO2 28  GLUCOSE 234*  BUN 12  CREATININE 0.71  CALCIUM 9.3   GFR: Estimated Creatinine Clearance: 77.1 mL/min (by C-G formula based on SCr of 0.71 mg/dL). Liver Function Tests: Recent Labs  Lab 05/19/21 1640  AST 16  ALT 14  ALKPHOS 102  BILITOT 0.4  PROT 7.8  ALBUMIN 3.8   No results for input(s): LIPASE, AMYLASE in the last 168 hours. No results for input(s): AMMONIA in the last 168 hours. Coagulation Profile: Recent Labs  Lab 05/19/21 1640  INR 1.0   Cardiac Enzymes: No results for input(s): CKTOTAL, CKMB, CKMBINDEX, TROPONINI in the last 168 hours. BNP (last 3 results) No results for input(s): PROBNP in the last 8760 hours. HbA1C: No  results for input(s): HGBA1C in the last 72 hours. CBG: Recent Labs  Lab 05/19/21 1718  GLUCAP 225*   Lipid Profile: No results for input(s): CHOL, HDL, LDLCALC, TRIG, CHOLHDL, LDLDIRECT in the last 72 hours. Thyroid Function Tests: No results for input(s): TSH, T4TOTAL, FREET4, T3FREE, THYROIDAB in the last 72 hours. Anemia Panel: No results for input(s): VITAMINB12, FOLATE, FERRITIN, TIBC, IRON, RETICCTPCT in the last 72 hours. Urine analysis: No results found for: COLORURINE, APPEARANCEUR, LABSPEC, [place], GLUCOSEU, [place], BILIRUBINUR, KETONESUR, PROTEINUR, UROBILINOGEN, NITRITE, LEUKOCYTESUR  Radiological Exams on Admission: CT HEAD WO CONTRAST  Result Date: 05/19/2021 CLINICAL DATA:  Headache for 2 days. EXAM: CT HEAD WITHOUT CONTRAST TECHNIQUE: Contiguous axial images were obtained from the base of the skull through the vertex without intravenous contrast.  COMPARISON:  Head CT 02/07/2017. FINDINGS: Brain: No evidence of acute infarction, hemorrhage, hydrocephalus, extra-axial collection or mass lesion/mass effect. Vascular: No hyperdense vessel.  Intracranial atherosclerosis noted. Skull: Intact. No focal lesion. Failure fusion of the posterior arch of C1 is incidentally noted. Sinuses/Orbits: Negative. Other: None. IMPRESSION: No acute intracranial normality. Atherosclerosis. Electronically Signed   By: Inge Rise M.D.   On: 05/19/2021 17:12    EKG: Independently reviewed.   Assessment/Plan Principal Problem:   TIA (transient ischemic attack) Active Problems:   Essential hypertension   DM2 (diabetes mellitus, type 2) (HCC)   COPD (chronic obstructive pulmonary disease) (HCC)   Tobacco use disorder   Fibromyalgia   Depression   CVA versus TIA Patient presenting from home with 2-day history of headache and left facial droop.  Complicated by history of HTN, DM2, HLD, current tobacco use disorder.  Patient states symptoms now back to baseline, although apparently did not pass nursing bedside swallow exam. --Admit to observation --Neurology consulted --MR brain without contrast --Carotid duplex ultrasound --TTE --Lipid panel, hemoglobin A1c, UDS --PT/OT/SLP evaluation --Aspirin 300 mg p.o. x1 followed by 81 mg p.o. daily --Will hold home antihypertensives, allow permissive hypertension over the next 24-48 hours and slowly will resume home regimen --Continue to monitor on telemetry  Dysphagia Patient failed initial nursing swallow evaluation at bedside. --Continue n.p.o. --LR at 75 MLS per hour --SLP evaluation  Essential hypertension BP 159/76 on admission.  On amlodipine 5 mg p.o. daily, triamterene-HCTZ 37.5-25 mg p.o. daily, metoprolol succinate 50 mg p.o. daily at baseline. --Hold home antihypertensives, allow permissive hypertension --Hydralazine as needed SBP > 220 or DBP >110  DM2 Home regimen includes NPH 44 units  twice daily, Victoza, metformin XR 1000 mg daily. --Hold home regimen while n.p.o. --Check hemoglobin A1c --Moderate SSI for further coverage --CBG before every meal/at bedtime  HLD:  --Lipid panel as above --Continue home pravastatin 20 mg p.o. daily  COPD --DuoNebs as needed  Neuropathy: Gabapentin 300 mg p.o. 3 times daily  Depression: Celexa 20 mg p.o. daily  GERD: Continue PPI  Insomnia: Trazodone 100 mg p.o. nightly   DVT prophylaxis: Lovenox Code Status: Full code Family Communication: No family present at bedside Disposition Plan: Anticipate discharge home when medically ready Consults called: Neurology, Dr. Leonel Ramsay, will see in a.m. Admission status: Observation Level of care: Med-Surg   At the point of initial evaluation, it is my clinical opinion that admission for OBSERVATION is reasonable and necessary because the patient's presenting complaints in the context of their chronic conditions represent sufficient risk of deterioration or significant morbidity to constitute reasonable grounds for close observation in the hospital setting, but that the patient may be medically stable for discharge from the  hospital within 24 to 48 hours.    Kourtnee Lahey J [place] DO [facility] Available via Epic secure chat 7am-7pm After these hours, please refer to coverage provider listed on [email or web address] 05/19/2021, 7:53 PM

## 2021-05-19 NOTE — Progress Notes (Signed)
Pt returned to room from MRI.

## 2021-05-20 ENCOUNTER — Observation Stay: Admit: 2021-05-20 | Payer: Medicare HMO

## 2021-05-20 ENCOUNTER — Encounter: Payer: Self-pay | Admitting: Internal Medicine

## 2021-05-20 ENCOUNTER — Observation Stay (HOSPITAL_BASED_OUTPATIENT_CLINIC_OR_DEPARTMENT_OTHER)
Admit: 2021-05-20 | Discharge: 2021-05-20 | Disposition: A | Discharge status: Home / Self Care | Payer: Medicare HMO | Attending: Internal Medicine | Admitting: Internal Medicine

## 2021-05-20 ENCOUNTER — Observation Stay: Payer: Medicare HMO

## 2021-05-20 DIAGNOSIS — J42 Unspecified chronic bronchitis: Secondary | ICD-10-CM | POA: Diagnosis not present

## 2021-05-20 DIAGNOSIS — G459 Transient cerebral ischemic attack, unspecified: Secondary | ICD-10-CM | POA: Diagnosis not present

## 2021-05-20 DIAGNOSIS — I10 Essential (primary) hypertension: Secondary | ICD-10-CM | POA: Diagnosis not present

## 2021-05-20 LAB — LIPID PANEL
Cholesterol: 156 mg/dL (ref 0–200)
HDL: 35 mg/dL — ABNORMAL LOW (ref >40)
LDL Cholesterol: 82 mg/dL (ref 0–99)
Total CHOL/HDL Ratio: 4.5 RATIO
Triglycerides: 194 mg/dL — ABNORMAL HIGH (ref <150)
VLDL: 39 mg/dL (ref 0–40)

## 2021-05-20 LAB — COMPREHENSIVE METABOLIC PANEL
ALT: 12 U/L (ref 0–44)
AST: 14 U/L — ABNORMAL LOW (ref 15–41)
Albumin: 3.6 g/dL (ref 3.5–5.0)
Alkaline Phosphatase: 94 U/L (ref 38–126)
Anion gap: 8 (ref 5–15)
BUN: 12 mg/dL (ref 6–20)
CO2: 28 mmol/L (ref 22–32)
Calcium: 9.1 mg/dL (ref 8.9–10.3)
Chloride: 103 mmol/L (ref 98–111)
Creatinine, Ser: 0.72 mg/dL (ref 0.44–1.00)
GFR, Estimated: >60 mL/min (ref >60)
Glucose, Bld: 260 mg/dL — ABNORMAL HIGH (ref 70–99)
Potassium: 3.2 mmol/L — ABNORMAL LOW (ref 3.5–5.1)
Sodium: 139 mmol/L (ref 135–145)
Total Bilirubin: 0.5 mg/dL (ref 0.3–1.2)
Total Protein: 7.3 g/dL (ref 6.5–8.1)

## 2021-05-20 LAB — ECHOCARDIOGRAM COMPLETE
AR max vel: 1.77 cm2
AV Area VTI: 2.04 cm2
AV Area mean vel: 1.93 cm2
AV Mean grad: 1.7 mmHg
AV Peak grad: 3.4 mmHg
Ao pk vel: 0.92 m/s
Area-P 1/2: 3.53 cm2
Height: 64 in
S' Lateral: 2.51 cm
Weight: 2864 oz

## 2021-05-20 LAB — HEMOGLOBIN A1C
Hgb A1c MFr Bld: 9.2 % — ABNORMAL HIGH (ref 4.8–5.6)
Mean Plasma Glucose: 217.34 mg/dL

## 2021-05-20 LAB — CBC
HCT: 42.8 % (ref 36.0–46.0)
Hemoglobin: 14.7 g/dL (ref 12.0–15.0)
MCH: 31.1 pg (ref 26.0–34.0)
MCHC: 34.3 g/dL (ref 30.0–36.0)
MCV: 90.5 fL (ref 80.0–100.0)
Platelets: 244 10*3/uL (ref 150–400)
RBC: 4.73 MIL/uL (ref 3.87–5.11)
RDW: 12.6 % (ref 11.5–15.5)
WBC: 7 10*3/uL (ref 4.0–10.5)
nRBC: 0 % (ref 0.0–0.2)

## 2021-05-20 LAB — GLUCOSE, CAPILLARY
Glucose-Capillary: 230 mg/dL — ABNORMAL HIGH (ref 70–99)
Glucose-Capillary: 244 mg/dL — ABNORMAL HIGH (ref 70–99)

## 2021-05-20 LAB — HIV ANTIBODY (ROUTINE TESTING W REFLEX): HIV Screen 4th Generation wRfx: NONREACTIVE

## 2021-05-20 MED ORDER — ASPIRIN EC 81 MG PO TBEC: 81.0000 mg | DELAYED_RELEASE_TABLET | Freq: Every day | ORAL | 0 refills | Status: AC

## 2021-05-20 MED ORDER — ATORVASTATIN CALCIUM 40 MG PO TABS: 40.0000 mg | ORAL_TABLET | Freq: Every day | ORAL | 0 refills | Status: DC

## 2021-05-20 MED ORDER — POTASSIUM CHLORIDE CRYS ER 20 MEQ PO TBCR
40.0000 meq | EXTENDED_RELEASE_TABLET | Freq: Once | ORAL | Status: AC
  Administered 2021-05-20: 11:00:00 40 meq via ORAL
  Filled 2021-05-20: qty 2

## 2021-05-20 MED ORDER — ATORVASTATIN CALCIUM 20 MG PO TABS: 40.0000 mg | ORAL_TABLET | Freq: Every day | ORAL | Status: DC

## 2021-05-20 MED ORDER — GABAPENTIN 300 MG PO CAPS: 300.0000 mg | ORAL_CAPSULE | Freq: Three times a day (TID) | ORAL | Status: DC | PRN

## 2021-05-20 MED ORDER — POTASSIUM CHLORIDE 20 MEQ PO PACK
40.0000 meq | PACK | ORAL | Status: DC
  Administered 2021-05-20: 40 meq via ORAL
  Filled 2021-05-20: qty 2

## 2021-05-20 MED ORDER — CLOPIDOGREL BISULFATE 75 MG PO TABS: 75.0000 mg | ORAL_TABLET | Freq: Every day | ORAL | Status: DC

## 2021-05-20 MED ORDER — CLOPIDOGREL BISULFATE 75 MG PO TABS
300.0000 mg | ORAL_TABLET | Freq: Once | ORAL | Status: AC
  Administered 2021-05-20: 300 mg via ORAL
  Filled 2021-05-20: qty 4

## 2021-05-20 MED ORDER — IOHEXOL 350 MG/ML SOLN
75.0000 mL | Freq: Once | INTRAVENOUS | Status: AC | PRN
  Administered 2021-05-20: 12:00:00 75 mL via INTRAVENOUS

## 2021-05-20 MED ORDER — PANTOPRAZOLE SODIUM 40 MG PO TBEC: 80.0000 mg | DELAYED_RELEASE_TABLET | Freq: Every day | ORAL | 0 refills | Status: DC

## 2021-05-20 MED ORDER — CLOPIDOGREL BISULFATE 75 MG PO TABS: 75.0000 mg | ORAL_TABLET | Freq: Every day | ORAL | 0 refills | Status: AC

## 2021-05-20 NOTE — Progress Notes (Signed)
*  PRELIMINARY RESULTS* Echocardiogram 2D Echocardiogram has been performed.  Sherrie Sport 05/20/2021, 12:46 PM

## 2021-05-20 NOTE — Progress Notes (Signed)
Inpatient Diabetes Program Recommendations  AACE/ADA: New Consensus Statement on Inpatient Glycemic Control (2015)  Target Ranges:  Prepandial:   less than 140 mg/dL      Peak postprandial:   less than 180 mg/dL (1-2 hours)      Critically ill patients:  140 - 180 mg/dL   Results for Anne Brennan, Anne Brennan (MRN [id number]) as of 05/20/2021 07:50  Ref. Range 05/19/2021 17:18 05/19/2021 21:14 05/20/2021 07:25  Glucose-Capillary Latest Ref Range: 70 - 99 mg/dL 225 (H) 267 (H)  3 units NOVOLOG  230 (H)    Admit with: CVA versus TIA (Headache and Facial droop)  History: DM2, COPD  Home DM Meds: Victoza 1.5 mg Daily        Metformin 1000 mg Daily       Novolin 70/30 Insulin 44 units BID  Current Orders: Novolog Moderate Correction Scale/ SSI (0-15 units) TID AC + HS    MD- Unsure of PO intake at this point.  May be safer to use basal/bolus while inpatient and can transition back to 70/30 Insulin BID after pt discharged home  Please consider adding Semglee 15 units Daily (0.2 units/kg)     --Will follow patient during hospitalization--  Wyn Quaker RN, MSN, CDE Diabetes Coordinator Inpatient Glycemic Control Team Team Pager: [phone] (8a-5p)

## 2021-05-20 NOTE — Discharge Summary (Addendum)
Pain.Physician Discharge Summary  Patient ID: Anne Brennan MRN: [id number] DOB/AGE: 1961/02/14 60 y.o.  Admit date: 05/19/2021 Discharge date: 05/20/2021  Admission Diagnoses:  Discharge Diagnoses:  Principal Problem:   TIA (transient ischemic attack) Active Problems:   Essential hypertension   DM2 (diabetes mellitus, type 2) (HCC)   COPD (chronic obstructive pulmonary disease) (HCC)   Tobacco use disorder   Fibromyalgia   Depression Acute right-sided ischemic stroke. Thyroid nodules.  Discharged Condition: good  Hospital Course:  Anne Brennan is a 60 y.o. female with medical history significant of essential hypertension, DM2, HLD, COPD, tobacco use disorder, fibromyalgia, depression who presented to [facility] ED on 7/31 with complaint of headache and left facial droop x2 days, Upon arriving the hospital, telemetry did not show arrhythmia.  MRI brain showed multiple hyperintensity.  CT angiogram of the neck and head showed 60% stenosis in internal carotid artery.  LDL 82. Patient has been seen by neurology, started aspirin, Plavix and increased dose of Lipitor.  Continue antiplatelets for 3 weeks.  Patient be followed with neurology if additional treatment is needed. Patient also has been seen by speech therapy, she does not have significant dysphagia.  PT /OT also see the patient, no additional need.  Currently patient is medically stable to be discharged.  She will follow-up with PCP and neurology as outpatient.  Per neurology, patient has MRI negative acute stroke.  CT scan also showed multiple thyroid nodules, this is incidental finding.  Please refer patient to ENT with next office visit with PCP.      Consults: neurology  Significant Diagnostic Studies:  Echo: 1. Left ventricular ejection fraction, by estimation, is 60 to 65%. The left ventricle has normal function. The left ventricle has no regional wall motion abnormalities. There is moderate left ventricular  hypertrophy. Left ventricular diastolic parameters are indeterminate. 2. Right ventricular systolic function is normal. The right ventricular size is normal. Tricuspid regurgitation signal is inadequate for assessing PA pressure. 3. The mitral valve is normal in structure. No evidence of mitral valve regurgitation. No evidence of mitral stenosis. 4. The aortic valve is normal in structure. Aortic valve regurgitation is not visualized. No aortic stenosis is present.  CT ANGIOGRAPHY HEAD AND NECK   TECHNIQUE: Multidetector CT imaging of the head and neck was performed using the standard protocol during bolus administration of intravenous contrast. Multiplanar CT image reconstructions and MIPs were obtained to evaluate the vascular anatomy. Carotid stenosis measurements (when applicable) are obtained utilizing NASCET criteria, using the distal internal carotid diameter as the denominator.   CONTRAST:  38m OMNIPAQUE IOHEXOL 350 MG/ML SOLN   COMPARISON:  05/19/2021 MRI and CT.   FINDINGS: CT HEAD FINDINGS   Brain: No evidence of acute large vascular territory infarction, hemorrhage, hydrocephalus, extra-axial collection or mass lesion/mass effect.   Vascular: See below.   Skull: No acute fracture.   Sinuses: Visualized sinuses are clear.   Orbits: Unremarkable orbits.   Review of the MIP images confirms the above findings   CTA NECK FINDINGS   Aortic arch: Great vessel origins are patent. Approximately 50% stenosis of the proximal left subclavian artery.   Right carotid system: Calcific and noncalcific atherosclerosis at the carotid bifurcation and involving the proximal internal carotid artery with approximately 60% stenosis of the proximal internal carotid artery.   Left carotid system: Calcific and noncalcific atherosclerosis at the carotid bifurcation without greater than 50% stenosis.   Vertebral arteries: Right dominant. No evidence of significant (greater than  50%) stenosis.  Skeleton: Mild multilevel degenerative change of the cervical spine.   Other neck: Heterogeneous thyroid with multiple nodules, measuring up to 2 cm on the right.   Upper chest: Mild paraseptal emphysema. No consolidation in the visualized lung apices.   Review of the MIP images confirms the above findings   CTA HEAD FINDINGS   Anterior circulation: Calcific atherosclerosis of bilateral intracranial ICAs with mild narrowing. Bilateral MCAs and ACAs are patent without proximal hemodynamically significant stenosis.   Posterior circulation: Bilateral intradural vertebral arteries, basilar artery, and posterior cerebral arteries are patent without proximal hemodynamically significant stenosis. Mild left P2 PCA stenosis. No aneurysm identified.   Venous sinuses: As permitted by contrast timing, patent.   Review of the MIP images confirms the above findings   IMPRESSION: CT Head:   No evidence of acute intracranial abnormality.   CTA Head:   1. No large vessel occlusion or proximal hemodynamically significant stenosis. 2. Mild left P2 PCA and bilateral intracranial ICA atherosclerotic narrowing.   CTA Neck:   1. Bilateral carotid bifurcation/proximal ICA atherosclerosis with 60% stenosis of the proximal right internal carotid artery. 2. Approximately 50% stenosis of the proximal left subclavian artery. 3. Multiple thyroid nodules, measuring up to 2 cm on the right. Recommend thyroid US (ref: J Am Coll Radiol. 2015 Feb;12(2): 143-50). 4. Mild paraseptal emphysema.     Electronically Signed   By: Margaretha Sheffield MD   On: 05/20/2021 12:18  MRI HEAD WITHOUT CONTRAST   TECHNIQUE: Multiplanar, multiecho pulse sequences of the brain and surrounding structures were obtained without intravenous contrast.   COMPARISON:  Prior CT from earlier the same day.   FINDINGS: Brain: Cerebral volume within normal limits for age. Scattered patchy T2/FLAIR  hyperintensity seen involving the periventricular, deep, and subcortical white matter of both cerebral hemispheres, nonspecific, but overall mild in nature. Few of these foci are oriented perpendicular to the lateral ventricles.   No abnormal foci of restricted diffusion to suggest acute or subacute ischemia. Gray-white matter differentiation maintained. No encephalomalacia to suggest chronic cortical infarction. No evidence for acute or chronic intracranial hemorrhage.   No mass lesion, midline shift or mass effect. No hydrocephalus or extra-axial fluid collection. Pituitary gland and suprasellar region normal. Midline structures intact.   Vascular: Major intracranial vascular flow voids are maintained.   Skull and upper cervical spine: Craniocervical junction within normal limits. Bone marrow signal intensity normal. No scalp soft tissue abnormality.   Sinuses/Orbits: Globes and orbital soft tissues within normal limits. Paranasal sinuses are largely clear. No mastoid effusion. Inner ear structures grossly normal.   Other: None.   IMPRESSION: 1. No acute intracranial abnormality. 2. Scattered patchy T2/FLAIR hyperintensity involving the periventricular, deep, and subcortical white matter of both cerebral hemispheres, nonspecific, but overall mild in nature. Differential considerations are broad, and include sequelae of chronic small-vessel ischemia, demyelinating disease, changes related to prior trauma, hypercoagulable state, vasculitis, sequelae of complicated migraines, or prior infectious or inflammatory process.     Electronically Signed   By: Jeannine Boga M.D.   On: 05/19/2021 22:53    Treatments: IV fluids, ASA, plavix  Discharge Exam: Blood pressure (!) 170/92, pulse 73, temperature 97.9 F (36.6 C), temperature source Oral, resp. rate 20, height '5\' 4"'$  (1.626 m), weight 81.2 kg, SpO2 98 %. General appearance: alert and cooperative Resp: clear to  auscultation bilaterally Cardio: regular rate and rhythm, S1, S2 normal, no murmur, click, rub or gallop GI: soft, non-tender; bowel sounds normal; no masses,  no organomegaly  Extremities: extremities normal, atraumatic, no cyanosis or edema  Disposition: Discharge disposition: 01-Home or Self Care       Discharge Instructions     Diet - low sodium heart healthy   Complete by: As directed    Increase activity slowly   Complete by: As directed       Allergies as of 05/20/2021       Reactions   Ibuprofen Other (See Comments)   N/V/ salivate   Penicillins Rash        Medication List     STOP taking these medications    azithromycin 250 MG tablet Commonly known as: Zithromax Z-Pak   omeprazole 20 MG capsule Commonly known as: PRILOSEC Replaced by: pantoprazole 40 MG tablet   pravastatin 20 MG tablet Commonly known as: PRAVACHOL       TAKE these medications    albuterol 108 (90 Base) MCG/ACT inhaler Commonly known as: VENTOLIN HFA Inhale 2 puffs into the lungs every 4 (four) hours as needed for shortness of breath or wheezing.   amLODipine 5 MG tablet Commonly known as: NORVASC Take 5 mg by mouth daily.   aspirin EC 81 MG tablet Take 1 tablet (81 mg total) by mouth daily for 21 days. Swallow whole.   atorvastatin 40 MG tablet Commonly known as: LIPITOR Take 1 tablet (40 mg total) by mouth daily.   citalopram 20 MG tablet Commonly known as: CELEXA Take 20 mg by mouth daily.   clopidogrel 75 MG tablet Commonly known as: Plavix Take 1 tablet (75 mg total) by mouth daily for 21 days.   cyclobenzaprine 10 MG tablet Commonly known as: FLEXERIL Take 10 mg by mouth 3 (three) times daily.   gabapentin 400 MG capsule Commonly known as: NEURONTIN Take 800 mg by mouth 3 (three) times daily as needed.   HYDROcodone-acetaminophen 10-325 MG tablet Commonly known as: NORCO Take 1 tablet by mouth every 6 (six) hours as needed for pain.   liraglutide 18  MG/3ML Sopn Commonly known as: VICTOZA Inject 1.5 mg into the skin daily.   metFORMIN 500 MG 24 hr tablet Commonly known as: GLUCOPHAGE-XR Take 1,000 mg by mouth daily.   metoprolol succinate 50 MG 24 hr tablet Commonly known as: TOPROL-XL Take 50 mg by mouth daily.   montelukast 10 MG tablet Commonly known as: SINGULAIR Take 10 mg by mouth at bedtime.   NovoLIN 70/30 (70-30) 100 UNIT/ML injection Generic drug: insulin NPH-regular Human Inject 44 Units into the skin 2 (two) times daily.   pantoprazole 40 MG tablet Commonly known as: PROTONIX Take 2 tablets (80 mg total) by mouth daily. Start taking on: May 21, 2021 Replaces: omeprazole 20 MG capsule   traZODone 100 MG tablet Commonly known as: DESYREL Take 100 mg by mouth at bedtime.   triamterene-hydrochlorothiazide 37.5-25 MG tablet Commonly known as: MAXZIDE-25 Take 1 tablet by mouth daily.        Follow-up Information     Kizer, Gwynn Burly, MD Follow up in 1 week(s).   Specialty: Geriatric Medicine Contact information: [id number] [facility] [place] [address] [facility] Follow up in 2 week(s).   Contact information: [place] [phone]                Signed: Sharen Hones 05/20/2021, 1:53 PM

## 2021-05-20 NOTE — Progress Notes (Signed)
Called and spoke with pt , gave her appointment info for Neurology with Dr. Cornelia Copa on 8/15 at 12:30. Provided pt with address and phone number for Dr. Cornelia Copa.

## 2021-05-20 NOTE — Evaluation (Signed)
Physical Therapy Evaluation Patient Details Name: Anne Brennan MRN: [id number] DOB: 1961/07/16 Today's Date: 05/20/2021   History of Present Illness  60 y.o. female who presented to [facility] ED on 7/31 with complaint of headache and left facial droop x2 days. Medical history significant of essential hypertension, DM2, HLD, COPD, tobacco use disorder, fibromyalgia, depression.  Clinical Impression  Pt received supine in bed, agreeable to therapy. Daughter in room. Bed mobility performed Mod I, increased time required. Once sitting EOB, pt stated she needed to use the restroom. After toileting, pt ambulated in hallway without AD; CGA required to steady. Pt demo increased stability ambulating with RW compared to no AD - pt educated on this and agreeable to using RW to ambulate to the restroom while in the hospital. Vitals monitored throughout activity. SpO2 remained greater than 95% and maximal HR noted was 94. Pt requested to return to bed to rest at end of session. Pt is a great candidate to work with our mobility specialist. Pt would benefit from skilled PT to address above stability deficits and promote optimal return to PLOF.     Follow Up Recommendations No PT follow up    Equipment Recommendations  None recommended by PT    Recommendations for Other Services       Precautions / Restrictions Precautions Precautions: None Restrictions Weight Bearing Restrictions: No      Mobility  Bed Mobility Overal bed mobility: Modified Independent             General bed mobility comments: increased time to complete supine<>sit    Transfers Overall transfer level: Needs assistance Equipment used: Rolling walker (2 wheeled) Transfers: Sit to/from Stand Sit to Stand: Supervision         General transfer comment: SUP for safety. RW used for first rep, no AD for other 2 reps.  Ambulation/Gait Ambulation/Gait assistance: Min guard Gait Distance (Feet): 240 Feet Assistive  device: None Gait Pattern/deviations: WFL(Within Functional Limits);Decreased step length - right;Decreased step length - left;Decreased stride length;Decreased weight shift to right;Narrow base of support Gait velocity: decreased   General Gait Details: No AD, CGA to steady on 3 occasions due to decreased stability when WB on RLE (weather related pain in RLE due to R TKA).  Stairs            Wheelchair Mobility    Modified Rankin (Stroke Patients Only)       Balance Overall balance assessment: Needs assistance Sitting-balance support: No upper extremity supported Sitting balance-Leahy Scale: Normal     Standing balance support: No upper extremity supported Standing balance-Leahy Scale: Fair Standing balance comment: Stability with RW - good. w/o RW - fair.                             Pertinent Vitals/Pain Pain Assessment: 0-10 Pain Score: 5  Pain Location: R knee, low back Pain Descriptors / Indicators: Aching Pain Intervention(s): Limited activity within patient's tolerance    Home Living Family/patient expects to be discharged to:: Private residence Living Arrangements: Children;Other relatives (2 grown children, 2 younger and pt brother with cognitive impairments) Available Help at Discharge: Family;Available 24 hours/day Type of Home: House Home Access: Stairs to enter Entrance Stairs-Rails: Right;Left;Can reach both Entrance Stairs-Number of Steps: 2 Home Layout: One level Home Equipment: Walker - 2 wheels;Bedside commode;Cane - single point Additional Comments: uses cane at baseline (only when walking out in the yard). Also has electric scooter but is  unable to charge the battery    Prior Function Level of Independence: Independent with assistive device(s)         Comments: Children assist when pt is fatigued     Hand Dominance   Dominant Hand: Right    Extremity/Trunk Assessment   Upper Extremity Assessment Upper Extremity  Assessment: Generalized weakness    Lower Extremity Assessment Lower Extremity Assessment: Overall WFL for tasks assessed    Cervical / Trunk Assessment Cervical / Trunk Assessment: Normal  Communication   Communication: No difficulties  Cognition Arousal/Alertness: Awake/alert Behavior During Therapy: WFL for tasks assessed/performed Overall Cognitive Status: Within Functional Limits for tasks assessed                                 General Comments: A&Ox4      General Comments      Exercises Other Exercises Other Exercises: Educated on PT role, POC, safety with mobility, improved balance while using RW. Ambulatory toilet transfer EOB<>toilet (23f ambulation x2) using RW. Standing balance while washing hands at sink. No concerns with sitting balance while on toilet.   Assessment/Plan    PT Assessment Patient needs continued PT services  PT Problem List Decreased strength;Decreased mobility;Decreased safety awareness;Decreased balance       PT Treatment Interventions DME instruction;Gait training;Therapeutic exercise;Stair training;Balance training;Functional mobility training;Neuromuscular re-education;Patient/family education    PT Goals (Current goals can be found in the Care Plan section)  Acute Rehab PT Goals Patient Stated Goal: I want to go home today. PT Goal Formulation: With patient Time For Goal Achievement: 06/03/21 Potential to Achieve Goals: Good    Frequency Min 2X/week   Barriers to discharge   N/A    Co-evaluation               AM-PAC PT "6 Clicks" Mobility  Outcome Measure Help needed turning from your back to your side while in a flat bed without using bedrails?: None Help needed moving from lying on your back to sitting on the side of a flat bed without using bedrails?: None Help needed moving to and from a bed to a chair (including a wheelchair)?: A Little Help needed standing up from a chair using your arms (e.g.,  wheelchair or bedside chair)?: A Little Help needed to walk in hospital room?: A Little Help needed climbing 3-5 steps with a railing? : A Little 6 Click Score: 20    End of Session Equipment Utilized During Treatment: Gait belt Activity Tolerance: Patient tolerated treatment well Patient left: in bed;with bed alarm set;with nursing/sitter in room Nurse Communication: Mobility status;Other (comment) (pt asking to speak to doctor) PT Visit Diagnosis: Unsteadiness on feet (R26.81);Other abnormalities of gait and mobility (R26.89);Muscle weakness (generalized) (M62.81);History of falling (Z91.81)    Time: [id number] Time Calculation (min) (ACUTE ONLY): 25 min   Charges:   PT Evaluation $PT Eval Moderate Complexity: 1 Mod PT Treatments $Therapeutic Activity: 8-22 mins        KPatrina LeveringPT, DPT 05/20/21 9:53 AM [id number]  KRamonita Lab8/10/2020, 9:53 AM

## 2021-05-20 NOTE — Progress Notes (Signed)
Pt d/c'd home with family, all d/c instructions and medication instructions given and explained to pt. IV and tele removed. All belongings sent with pt. All questions and concerns addressed with pt.

## 2021-05-20 NOTE — Evaluation (Signed)
Clinical/Bedside Swallow Evaluation Patient Details  Name: GREGORIA VITO MRN: [id number] Date of Birth: [birth year]  Today's Date: 05/20/2021 Time: SLP Start Time (ACUTE ONLY): 1330 SLP Stop Time (ACUTE ONLY): 1355 SLP Time Calculation (min) (ACUTE ONLY): 25 min  Past Medical History:  Past Medical History:  Diagnosis Date   Diabetes mellitus without complication ([place])    Fibromyalgia    Hypertension    Past Surgical History:  Past Surgical History:  Procedure Laterality Date   KNEE ARTHROSCOPY Bilateral    HPI:  Per admitting H&P "Pam H Wiecek is a 60 y.o. female with medical history significant of essential hypertension, DM2, HLD, COPD, tobacco use disorder, fibromyalgia, depression who presented to [facility] ED on 7/31 with complaint of headache and left facial droop x2 days.  Patient was encouraged by her daughter to present to the ED at time of initial event, but declined.  Patient also reported bilateral upper extremity weakness but states now her symptoms have resolved. "   Assessment / Plan / Recommendation Clinical Impression  Bedside swallow eval indicated as daughter reports Pt occasionbally has swallowing problems and gets "choked" easily. Oral mech exam revealed slight left facial droop, all other structures functioning adequately. Pt tolerated thin liquids, purees and solids with no s/s of aspiration. Oral transit delay with solids but Pt cleared adequately. Pt noted to be mostly aphonic with occasional very hoarse voiceing. Pt reports this has been ongoing for almost a year but she has not been evaulated by ENT. Rec continnue with regular diet, no swallowing therapy needed at this time. Pt is adament on leaving today or in am as she has young children at home. Rec OP ENT consult for endoscppy to evaluate vocal folds. Adult daughter and sister present and supportive, agreeable to make sure Pt sees ENT. Secure chat with MD re swallowing assessment today and recs for ENT  eval. No further inpatient follow up. OP ST may be warrented after ENT consult. SLP Visit Diagnosis: Dysphagia, oropharyngeal phase (R13.12)    Aspiration Risk  Mild aspiration risk    Diet Recommendation Regular   Liquid Administration via: Cup;Straw Medication Administration: Whole meds with liquid Compensations: Small sips/bites Postural Changes: Remain upright for at least 30 minutes after po intake;Seated upright at 90 degrees    Other  Recommendations Recommended Consults: Consider ENT evaluation   Follow up Recommendations Other (comment) (ENT consult)      Frequency and Duration            Prognosis   Good     Swallow Study   General Date of Onset: 05/19/21 HPI: Per admitting H&P "Fatuma NEVAIAH MIZERA is a 60 y.o. female with medical history significant of essential hypertension, DM2, HLD, COPD, tobacco use disorder, fibromyalgia, depression who presented to [facility] ED on 7/31 with complaint of headache and left facial droop x2 days.  Patient was encouraged by her daughter to present to the ED at time of initial event, but declined.  Patient also reported bilateral upper extremity weakness but states now her symptoms have resolved. " Type of Study: Bedside Swallow Evaluation Diet Prior to this Study: Regular Temperature Spikes Noted: No Respiratory Status: Room air History of Recent Intubation: No Behavior/Cognition: Alert;Cooperative;Pleasant mood Oral Cavity Assessment: Within Functional Limits Oral Care Completed by SLP: No Oral Cavity - Dentition: Adequate natural dentition Vision: Functional for self-feeding Self-Feeding Abilities: Able to feed self Patient Positioning: Upright in bed Baseline Vocal Quality: Aphonic;Hoarse;Breathy;Low vocal intensity Volitional Cough: Weak    Oral/Motor/Sensory  Function Overall Oral Motor/Sensory Function: Mild impairment Facial ROM: Within Functional Limits Facial Symmetry: Abnormal symmetry left Facial Strength: Within  Functional Limits Facial Sensation: Reduced left Lingual ROM: Within Functional Limits Lingual Symmetry: Within Functional Limits Lingual Strength: Within Functional Limits Lingual Sensation: Within Functional Limits Velum: Within Functional Limits Mandible: Within Functional Limits   Ice Chips Ice chips: Within functional limits   Thin Liquid Thin Liquid: Within functional limits Presentation: Cup;Spoon;Straw    Nectar Thick Nectar Thick Liquid: Not tested   Honey Thick Honey Thick Liquid: Not tested   Puree Puree: Within functional limits   Solid     Solid: Within functional limits Presentation: Self Fed      Lucila Maine 05/20/2021,2:05 PM

## 2021-05-20 NOTE — Evaluation (Signed)
Occupational Therapy Evaluation Patient Details Name: Anne Brennan MRN: [id number] DOB: 1961-05-10 Today's Date: 05/20/2021    History of Present Illness 60 y.o. female who presented to [facility] ED on 7/31 with complaint of headache and left facial droop x2 days. Medical history significant of essential hypertension, DM2, HLD, COPD, tobacco use disorder, fibromyalgia, depression.   Clinical Impression   Anne Brennan was pleasant and agreeable to today's evaluation.  She appears to be back to her baseline level of functioning with little-to-no lingering neurological symptoms.  Prior to admission, pt was independent in all ADLs and IADLs, occasionally using SPC for ambulation outside the home.  Currently, pt is able to complete basic mobility and ADLs with supervision assist.  No visual changes, numbness/tingling, or motor deficits noted in evaluation.  OT provided education re: DME recommendations for safety in ADLs at home, specifically showering as pt reports some difficulty in showering independently.  As pt is close to her baseline level of functioning, do not anticipate any need for skilled OT services in acute setting or follow-up after discharge.  Will sign off at this time, please re-consult if pt has change in functional status or additional OT-related needs arise.  Pt verbalized understanding of this plan.    Follow Up Recommendations  No OT follow up    Equipment Recommendations  Tub/shower seat (tub seat with back)    Recommendations for Other Services       Precautions / Restrictions Precautions Precautions: None Restrictions Weight Bearing Restrictions: No      Mobility Bed Mobility Overal bed mobility: Modified Independent             General bed mobility comments: increased time to complete supine<>sit    Transfers Overall transfer level: Needs assistance Equipment used: Rolling walker (2 wheeled) Transfers: Sit to/from Stand Sit to Stand: Supervision          General transfer comment: SUP for safety. RW used for first rep, no AD for other 2 reps.    Balance Overall balance assessment: Needs assistance Sitting-balance support: No upper extremity supported Sitting balance-Leahy Scale: Normal     Standing balance support: No upper extremity supported Standing balance-Leahy Scale: Fair Standing balance comment: without use of RW                           ADL either performed or assessed with clinical judgement   ADL Overall ADL's : Modified independent                                       General ADL Comments: Pt generally able to complete BADLs independently, including bathing, dressing, grooming, feeding, and toileting.  Pt uses SPC intermittently.     Vision Patient Visual Report: No change from baseline       Perception     Praxis      Pertinent Vitals/Pain Pain Assessment: No/denies pain Pain Score: 5  Pain Location: R knee, low back Pain Descriptors / Indicators: Aching Pain Intervention(s): Limited activity within patient's tolerance     Hand Dominance Right   Extremity/Trunk Assessment Upper Extremity Assessment Upper Extremity Assessment: Generalized weakness   Lower Extremity Assessment Lower Extremity Assessment: Overall WFL for tasks assessed   Cervical / Trunk Assessment Cervical / Trunk Assessment: Normal   Communication Communication Communication: No difficulties (hoarse voice but pt reports this is chronic)  Cognition Arousal/Alertness: Awake/alert Behavior During Therapy: WFL for tasks assessed/performed Overall Cognitive Status: Within Functional Limits for tasks assessed                                 General Comments: A&Ox4   General Comments       Exercises Other Exercises Other Exercises: Educated on PT role, POC, safety with mobility, improved balance while using RW. Ambulatory toilet transfer EOB<>toilet (9f ambulation x2) using  RW. Standing balance while washing hands at sink. No concerns with sitting balance while on toilet. Other Exercises: Provided education re: OT role and plan of care, self care, DME recommendations for safe showering   Shoulder Instructions      Home Living Family/patient expects to be discharged to:: Private residence Living Arrangements: Children;Other relatives (2 grown children, 2 younger and pt brother with cognitive impairments) Available Help at Discharge: Family;Available 24 hours/day Type of Home: House Home Access: Stairs to enter ECenterPoint Energyof Steps: 2 Entrance Stairs-Rails: Right;Left;Can reach both Home Layout: One level     Bathroom Shower/Tub: TTeacher, early years/pre Standard Bathroom Accessibility: No   Home Equipment: WEnvironmental consultant- 2 wheels;Bedside commode;Cane - single point   Additional Comments: uses cane at baseline (only when walking out in the yard). Also has electric scooter but is unable to charge the battery      Prior Functioning/Environment Level of Independence: Independent with assistive device(s)        Comments: Children assist when pt is fatigued        OT Problem List: Decreased knowledge of use of DME or AE;Decreased strength      OT Treatment/Interventions:      OT Goals(Current goals can be found in the care plan section) Acute Rehab OT Goals Patient Stated Goal: to go home OT Goal Formulation: With patient/family  OT Frequency:     Barriers to D/C:            Co-evaluation              AM-PAC OT "6 Clicks" Daily Activity     Outcome Measure Help from another person eating meals?: None Help from another person taking care of personal grooming?: None Help from another person toileting, which includes using toliet, bedpan, or urinal?: None Help from another person bathing (including washing, rinsing, drying)?: None Help from another person to put on and taking off regular upper body clothing?:  None Help from another person to put on and taking off regular lower body clothing?: None 6 Click Score: 24   End of Session    Activity Tolerance: Patient tolerated treatment well Patient left: in bed;with call bell/phone within reach;with family/visitor present                   Time: 1111-1120 OT Time Calculation (min): 9 min Charges:  OT General Charges $OT Visit: 1 Visit OT Evaluation $OT Eval Low Complexity: 1 Low  Kristen Bushway, OTR/L 05/20/21, 11:41 AM

## 2021-05-20 NOTE — Plan of Care (Signed)
Spoke with pt and family about goals going forward to prevent a stroke/ MI. Decreasing BG levels, stress levels, BP and dietary changes . Pt and family were receptive to suggestions and plan of care moving forward.

## 2021-05-20 NOTE — Consult Note (Signed)
Neurology Consultation Reason for Consult: Facial droop Referring Physician: [place] E  CC: Facial weakness  History is obtained from: Patient  HPI: RAFA DOMOND is a 60 y.o. female with a history of diabetes and hypertension who presents with right facial weakness and left hemisensory loss that started abruptly 2 days ago.  She denies any appendicular weakness.  She does have a hoarse voice, she states that this is longstanding and nothing new.  Of note she is a smoker.  LKW: 3 days ago tpa given?: no, outside window   ROS: A 14 point ROS was performed and is negative except as noted in the HPI.  Past Medical History:  Diagnosis Date   Diabetes mellitus without complication ([place])    Fibromyalgia    Hypertension      Family history: Multiple siblings with stroke   Social History:  reports that she has been smoking cigarettes. She has been smoking an average of .5 packs per day. She has never used smokeless tobacco. She reports that she does not drink alcohol. No history on file for drug use.   Exam: Current vital signs: BP (!) 157/81 (BP Location: Left Arm)   Pulse 67   Temp 98 F (36.7 C) (Oral)   Resp 16   Ht '5\' 4"'$  (1.626 m)   Wt 81.2 kg   SpO2 93%   BMI 30.73 kg/m  Vital signs in last 24 hours: Temp:  [97.8 F (36.6 C)-98 F (36.7 C)] 98 F (36.7 C) (07/31 2252) Pulse Rate:  [67-85] 67 (08/01 0707) Resp:  [14-16] 16 (07/31 2252) BP: (150-188)/(73-91) 157/81 (08/01 0707) SpO2:  [93 %-100 %] 93 % (08/01 0707) Weight:  [81.2 kg] 81.2 kg (07/31 1637)   Physical Exam  Constitutional: Appears well-developed and well-nourished.  Psych: Affect appropriate to situation Eyes: No scleral injection HENT: No OP obstruction MSK: no joint deformities.  Cardiovascular: Normal rate and regular rhythm.  Respiratory: Effort normal, non-labored breathing GI: Soft.  No distension. There is no tenderness.  Skin: WDI  Neuro: Mental Status: Patient is awake,  alert, oriented to person, place, month, year, and situation. Patient is able to give a clear and coherent history. No signs of aphasia or neglect Cranial Nerves: II: Visual Fields are full. Pupils are equal, round, and reactive to light.   III,IV, VI: EOMI without ptosis or diploplia.  V: Facial sensation is symmetric to temperature VII: Facial movement with decreased nasolabial fold on the right and mild right facial weakness not involving the forehead VIII: hearing is intact to voice X: Uvula elevates symmetrically XI: Shoulder shrug is symmetric. XII: tongue is midline without atrophy or fasciculations.  Motor: Tone is normal. Bulk is normal. 5/5 strength was present in all four extremities.  Sensory:  She has diminished sensation in both the left arm and leg Cerebellar: FNF intact bilaterally   I have reviewed labs in epic and the results pertinent to this consultation are: Mild hypokalemia at 3.2 LDL 82 A1c 9.2  I have reviewed the images obtained: MRI brain - I question an area of mildly restricted diffusion in the right brainstem  Impression: 60 year old female with left hemisensory loss and right facial weakness.  I suspect that this does represent a small ischemic stroke, with a questionable area in the right brainstem which would could correspond to it.  MRI negative stroke would be another possibility.  Given the abrupt onset and her risk factors, I would favor ischemia as opposed to other etiologies.  Recommendations:  1) ASA 81 mg and Plavix 75 mg daily for 3 weeks 2) CT angiogram of the head and neck 3) echocardiogram 4) would increase statin therapy Lipitor 40 mg daily 5) PT OT ST eval's 6) patient insist on going home today, once the above is completed unless there are unexpected findings on the CTA or echo I would not have objections to this.   Roland Rack, MD [facility] [phone]  If 7pm- 7am, please page neurology on call as  listed in [place].

## 2021-07-01 DIAGNOSIS — E782 Mixed hyperlipidemia: Secondary | ICD-10-CM | POA: Diagnosis not present

## 2021-07-01 DIAGNOSIS — J431 Panlobular emphysema: Secondary | ICD-10-CM | POA: Diagnosis not present

## 2021-07-01 DIAGNOSIS — I25118 Atherosclerotic heart disease of native coronary artery with other forms of angina pectoris: Secondary | ICD-10-CM | POA: Diagnosis not present

## 2021-07-01 DIAGNOSIS — E118 Type 2 diabetes mellitus with unspecified complications: Secondary | ICD-10-CM | POA: Diagnosis not present

## 2021-07-01 DIAGNOSIS — I10 Essential (primary) hypertension: Secondary | ICD-10-CM | POA: Diagnosis not present

## 2021-07-01 DIAGNOSIS — F3341 Major depressive disorder, recurrent, in partial remission: Secondary | ICD-10-CM | POA: Diagnosis not present

## 2021-07-01 DIAGNOSIS — I4891 Unspecified atrial fibrillation: Secondary | ICD-10-CM | POA: Diagnosis not present

## 2021-07-01 DIAGNOSIS — Z7689 Persons encountering health services in other specified circumstances: Secondary | ICD-10-CM | POA: Diagnosis not present

## 2021-07-01 DIAGNOSIS — I208 Other forms of angina pectoris: Secondary | ICD-10-CM | POA: Diagnosis not present

## 2021-07-01 DIAGNOSIS — M797 Fibromyalgia: Secondary | ICD-10-CM | POA: Diagnosis not present

## 2021-07-15 ENCOUNTER — Other Ambulatory Visit: Payer: Self-pay | Admitting: Otolaryngology

## 2021-07-15 DIAGNOSIS — J381 Polyp of vocal cord and larynx: Secondary | ICD-10-CM | POA: Diagnosis not present

## 2021-07-15 DIAGNOSIS — J301 Allergic rhinitis due to pollen: Secondary | ICD-10-CM | POA: Diagnosis not present

## 2021-07-15 DIAGNOSIS — K219 Gastro-esophageal reflux disease without esophagitis: Secondary | ICD-10-CM | POA: Diagnosis not present

## 2021-07-15 DIAGNOSIS — R1314 Dysphagia, pharyngoesophageal phase: Secondary | ICD-10-CM | POA: Diagnosis not present

## 2021-07-15 DIAGNOSIS — E041 Nontoxic single thyroid nodule: Secondary | ICD-10-CM

## 2021-07-15 DIAGNOSIS — J310 Chronic rhinitis: Secondary | ICD-10-CM | POA: Diagnosis not present

## 2021-07-24 ENCOUNTER — Ambulatory Visit: Payer: Medicare HMO

## 2021-08-02 DIAGNOSIS — G8929 Other chronic pain: Secondary | ICD-10-CM | POA: Diagnosis not present

## 2021-08-02 DIAGNOSIS — Z794 Long term (current) use of insulin: Secondary | ICD-10-CM | POA: Diagnosis not present

## 2021-08-02 DIAGNOSIS — I2510 Atherosclerotic heart disease of native coronary artery without angina pectoris: Secondary | ICD-10-CM | POA: Diagnosis not present

## 2021-08-02 DIAGNOSIS — Z0000 Encounter for general adult medical examination without abnormal findings: Secondary | ICD-10-CM | POA: Diagnosis not present

## 2021-08-02 DIAGNOSIS — E785 Hyperlipidemia, unspecified: Secondary | ICD-10-CM | POA: Diagnosis not present

## 2021-08-02 DIAGNOSIS — J449 Chronic obstructive pulmonary disease, unspecified: Secondary | ICD-10-CM | POA: Diagnosis not present

## 2021-08-02 DIAGNOSIS — E119 Type 2 diabetes mellitus without complications: Secondary | ICD-10-CM | POA: Diagnosis not present

## 2021-08-02 DIAGNOSIS — I10 Essential (primary) hypertension: Secondary | ICD-10-CM | POA: Diagnosis not present

## 2021-08-08 ENCOUNTER — Ambulatory Visit
Admission: RE | Admit: 2021-08-08 | Discharge: 2021-08-08 | Disposition: A | Discharge status: Home / Self Care | Payer: Medicare HMO | Source: Ambulatory Visit | Attending: Otolaryngology | Admitting: Otolaryngology

## 2021-08-08 ENCOUNTER — Other Ambulatory Visit: Payer: Self-pay

## 2021-08-08 DIAGNOSIS — E041 Nontoxic single thyroid nodule: Secondary | ICD-10-CM | POA: Diagnosis not present

## 2021-08-08 DIAGNOSIS — E042 Nontoxic multinodular goiter: Secondary | ICD-10-CM | POA: Diagnosis not present

## 2021-08-26 DIAGNOSIS — J381 Polyp of vocal cord and larynx: Secondary | ICD-10-CM | POA: Diagnosis not present

## 2021-08-26 DIAGNOSIS — E042 Nontoxic multinodular goiter: Secondary | ICD-10-CM | POA: Diagnosis not present

## 2021-08-26 DIAGNOSIS — R1314 Dysphagia, pharyngoesophageal phase: Secondary | ICD-10-CM | POA: Diagnosis not present

## 2021-09-03 ENCOUNTER — Encounter: Payer: Self-pay | Admitting: Otolaryngology

## 2021-09-11 NOTE — Progress Notes (Signed)
Pt visited Cardiology on 07/01/21, at which point Holter and Lexiscan were recommended as part of workup for recent stroke.  Pt must complete workup and any needed treatment prior to care at [facility] for elective outpatient surgery.  If the procedure is needed prior to completion of workup, and is not truly elective, please refer to [facility] for care.

## 2021-10-09 ENCOUNTER — Encounter
Admission: RE | Admit: 2021-10-09 | Discharge: 2021-10-09 | Disposition: A | Discharge status: Home / Self Care | Payer: Medicare HMO | Source: Ambulatory Visit | Attending: Otolaryngology | Admitting: Otolaryngology

## 2021-10-09 ENCOUNTER — Other Ambulatory Visit: Payer: Self-pay

## 2021-10-09 DIAGNOSIS — I2510 Atherosclerotic heart disease of native coronary artery without angina pectoris: Secondary | ICD-10-CM

## 2021-10-09 DIAGNOSIS — F17200 Nicotine dependence, unspecified, uncomplicated: Secondary | ICD-10-CM

## 2021-10-09 DIAGNOSIS — J984 Other disorders of lung: Secondary | ICD-10-CM

## 2021-10-09 DIAGNOSIS — Z794 Long term (current) use of insulin: Secondary | ICD-10-CM

## 2021-10-09 HISTORY — DX: Hyperlipidemia, unspecified: E78.5

## 2021-10-09 NOTE — Pre-Procedure Instructions (Signed)
Pt unsure when she needs to stop her Plavix for her upcoming surgery on 10-23-21. She said it "maybe is a couple of days prior to surgery?" I instructed pt to call over to Dr Katherina Right office today to see exactly when it is they want her to stop the Plavix. Pt verbalized understanding

## 2021-10-09 NOTE — Patient Instructions (Addendum)
Your procedure is scheduled on:10-23-20 Wednesday Report to the Registration Desk on the 1st floor of the [facility].Then proceed to the 2nd floor Surgery Desk in the [facility] To find out your arrival time, please call [phone] between 1PM - 3PM on:10-22-20 Tuesday  REMEMBER: Instructions that are not followed completely may result in serious medical risk, up to and including death; or upon the discretion of your surgeon and anesthesiologist your surgery may need to be rescheduled.  Do not eat food after midnight the night before surgery.  No gum chewing, lozengers or hard candies.  You may however, drink Water up to 2 hours before you are scheduled to arrive for your surgery. Do not drink anything within 2 hours of your scheduled arrival time  Type 1 and Type 2 diabetics should only drink water.  TAKE THESE MEDICATIONS THE MORNING OF SURGERY WITH A SIP OF WATER: -amLODipine (NORVASC) -atorvastatin (LIPITOR) -citalopram (CELEXA) -cyclobenzaprine (FLEXERIL) -metoprolol succinate (TOPROL-XL) -omeprazole (PRILOSEC)  Use your albuterol (VENTOLIN HFA) the morning of surgery and bring inhaler to the hospital  Take half of your Insulin (NOVOLIN) (22 units) the night before surgery and NO insulin the morning of surgery  Call Dr Reola Mosher office today (10-09-21) about when you need to stop your clopidogrel (PLAVIX)   Stop your metFORMIN (GLUCOPHAGE-XR) 2 days prior to surgery-Last dose on 10-20-21 Sunday  Stop your canagliflozin [facility]) 3 days prior to surgery-Last dose on 10-19-21 Saturday  One week prior to surgery: Stop Anti-inflammatories (NSAIDS) such as Advil, Aleve, Ibuprofen, Motrin, Naproxen, Naprosyn and Aspirin based products such as Excedrin, Goodys Powder, BC Powder.You may however, continue to take Tylenol if needed for pain up until the day of surgery.  Stop ANY OVER THE COUNTER supplements/vitamins 7 days prior to surger  No Alcohol for 24 hours before or after  surgery.  No Smoking including e-cigarettes for 24 hours prior to surgery.  No chewable tobacco products for at least 6 hours prior to surgery.  No nicotine patches on the day of surgery.  Do not use any "recreational" drugs for at least a week prior to your surgery.  Please be advised that the combination of cocaine and anesthesia may have negative outcomes, up to and including death. If you test positive for cocaine, your surgery will be cancelled.  On the morning of surgery brush your teeth with toothpaste and water, you may rinse your mouth with mouthwash if you wish. Do not swallow any toothpaste or mouthwash  Do not wear jewelry, make-up, hairpins, clips or nail polish.  Do not wear lotions, powders, or perfumes.   Do not shave body from the neck down 48 hours prior to surgery just in case you cut yourself which could leave a site for infection.  Also, freshly shaved skin may become irritated if using the CHG soap.  Contact lenses, hearing aids and dentures may not be worn into surgery.  Do not bring valuables to the hospital. [facility] is not responsible for any missing/lost belongings or valuables.   Notify your doctor if there is any change in your medical condition (cold, fever, infection).  Wear comfortable clothing (specific to your surgery type) to the hospital.  After surgery, you can help prevent lung complications by doing breathing exercises.  Take deep breaths and cough every 1-2 hours. Your doctor may order a device called an Incentive Spirometer to help you take deep breaths. When coughing or sneezing, hold a pillow firmly against your incision with both hands. This  is called splinting. Doing this helps protect your incision. It also decreases belly discomfort.  If you are being admitted to the hospital overnight, leave your suitcase in the car. After surgery it may be brought to your room.  If you are being discharged the day of surgery, you will not be  allowed to drive home. You will need a responsible adult (18 years or older) to drive you home and stay with you that night.   If you are taking public transportation, you will need to have a responsible adult (18 years or older) with you. Please confirm with your physician that it is acceptable to use public transportation.   Please call the [facility] at [phone] if you have any questions about these instructions.  Surgery Visitation Policy:  Patients undergoing a surgery or procedure may have one family member or support person with them as long as that person is not COVID-19 positive or experiencing its symptoms.  That person may remain in the waiting area during the procedure and may rotate out with other people.  Inpatient Visitation:    Visiting hours are 7 a.m. to 8 p.m. Up to two visitors ages 16+ are allowed at one time in a patient room. The visitors may rotate out with other people during the day. Visitors must check out when they leave, or other visitors will not be allowed. One designated support person may remain overnight. The visitor must pass COVID-19 screenings, use hand sanitizer when entering and exiting the patients room and wear a mask at all times, including in the patients room. Patients must also wear a mask when staff or their visitor are in the room. Masking is required regardless of vaccination status.

## 2021-10-10 NOTE — Progress Notes (Signed)
Perioperative Services Pre-Admission/Anesthesia Testing    Date: 10/10/21  Name: Anne Brennan MRN:   [id number]  Re: Anesthesia review and recommendations prior to surgery  Planned Surgical Procedure(s):    Case: [id number] Date/Time: 10/23/21 0815   Procedure: MICROLARYNGOSCOPY WITH EXCISION VOCAL CORD POLYPS (Bilateral) - Diabetic   Anesthesia type: General   Pre-op diagnosis: VOCAL CORD POLYPS   Location: [place] 09 / [place] [facility]   Surgeons: Clyde Canterbury, MD   Clinical Notes:  Patient scheduled for the above procedure on 10/23/2021 with Dr. Clyde Canterbury, MD. Case details discussed with attending anesthesiologist on call Rosey Bath, MD) due to concerns from an anesthesiologist at our facility in [facility] (see below).   Patient admitted for small ischemic CVA on 05/19/2021; neurology consulted. TTE during admission was normal; LVEF 60-65%. Carotid doppler revealed a 43-15% RICA and < 40% LICA stenosis  Patient was started on daily clopidogrel and discharged home on 05/20/2021 with instructions to follow-up with outpatient cardiology.  She was seen in consult on 07/01/2021 by Dr. Katrine Coho, MD; notes reviewed. MD with plans for Holter monitor and myocardial perfusion imaging study for further evaluation.  Patient was scheduled to RTC in 2 months or sooner if needed.   This procedure was initially scheduled at [facility], however following review by attending anesthesiologist, case was canceled pending further cardiovascular recommendations; see following note from Dr. Ronelle Nigh, MD (anesthesiologist):   "Pt visited cardiology on 07/01/21, at which point Holter and Lexiscan were recommended as part of workup for recent stroke.  Pt must complete workup and any needed treatment prior to care at [facility] for elective outpatient surgery.  If the procedure is needed prior to completion of workup, and is not truly elective, please refer to  [facility] for care".  Impression and Plan:  Anne Brennan is scheduled for the above procedure on 10/23/2020 with Dr. Clyde Canterbury, MD. I have spoken with cardiology APP Dema Severin, NP-C) and was advised that Holter study was completed showing a predominate underlying NSR with no significant dysrhythmia or ectopy. Patient cancelled the ordered Lexiscan.  Cardiology provider that patient would be appropriate to proceed with planned surgical intervention at the LOW stratification originally issued by Dr. Clayborn Bigness. Patient has also received clearance from medicine Doy Hutching, MD) to proceed at an ACCEPTABLE risk.   Per anesthesia, while it would be beneficial to obtained the ordered cardiovascular workup, if the patient cancelled, she cannot be forced to have the recommended testing. Given that both cardiology and medicine have both signed off, anesthesia is ok with proceeding with planned surgical intervention as planned. Based on clinical review performed today (10/10/21), barring any significant acute changes in the patient's overall condition, it is anticipated that she will be able to proceed with the planned surgical intervention. Any acute changes in clinical condition may necessitate her procedure being postponed and/or cancelled. Patient will meet with anesthesia team (MD and/or CRNA) on this day of her procedure for preoperative evaluation/assessment.   Pre-surgical instructions were reviewed with the patient during her PAT appointment and questions were fielded by PAT clinical staff. Patient was advised that if any questions or concerns arise prior to her procedure then she should return a call to PAT and/or her surgeon's office to discuss.  Honor Loh, MSN, APRN, FNP-C, CEN [facility]  Peri-operative Services Nurse Practitioner Phone: [phone] 10/10/21 1:25 PM  NOTE: This note has been prepared using Dragon dictation software. Despite my best  ability to proofread, there is  always the potential that unintentional transcriptional errors may still occur from this process.

## 2021-10-16 ENCOUNTER — Encounter
Admission: RE | Admit: 2021-10-16 | Discharge: 2021-10-16 | Disposition: A | Discharge status: Home / Self Care | Payer: Medicare HMO | Source: Ambulatory Visit | Attending: Otolaryngology | Admitting: Otolaryngology

## 2021-10-16 ENCOUNTER — Other Ambulatory Visit: Payer: Self-pay

## 2021-10-16 DIAGNOSIS — F17200 Nicotine dependence, unspecified, uncomplicated: Secondary | ICD-10-CM | POA: Insufficient documentation

## 2021-10-16 DIAGNOSIS — E119 Type 2 diabetes mellitus without complications: Secondary | ICD-10-CM | POA: Diagnosis not present

## 2021-10-16 DIAGNOSIS — Z794 Long term (current) use of insulin: Secondary | ICD-10-CM | POA: Insufficient documentation

## 2021-10-16 DIAGNOSIS — J984 Other disorders of lung: Secondary | ICD-10-CM | POA: Diagnosis not present

## 2021-10-16 DIAGNOSIS — I2510 Atherosclerotic heart disease of native coronary artery without angina pectoris: Secondary | ICD-10-CM | POA: Diagnosis not present

## 2021-10-16 DIAGNOSIS — Z01812 Encounter for preprocedural laboratory examination: Secondary | ICD-10-CM | POA: Diagnosis not present

## 2021-10-16 DIAGNOSIS — J381 Polyp of vocal cord and larynx: Secondary | ICD-10-CM | POA: Diagnosis not present

## 2021-10-16 LAB — CBC
HCT: 41.4 % (ref 36.0–46.0)
Hemoglobin: 14.1 g/dL (ref 12.0–15.0)
MCH: 31.3 pg (ref 26.0–34.0)
MCHC: 34.1 g/dL (ref 30.0–36.0)
MCV: 91.8 fL (ref 80.0–100.0)
Platelets: 270 10*3/uL (ref 150–400)
RBC: 4.51 MIL/uL (ref 3.87–5.11)
RDW: 12.8 % (ref 11.5–15.5)
WBC: 8.2 10*3/uL (ref 4.0–10.5)
nRBC: 0 % (ref 0.0–0.2)

## 2021-10-16 LAB — BASIC METABOLIC PANEL
Anion gap: 9 (ref 5–15)
BUN: 14 mg/dL (ref 6–20)
CO2: 26 mmol/L (ref 22–32)
Calcium: 9.3 mg/dL (ref 8.9–10.3)
Chloride: 103 mmol/L (ref 98–111)
Creatinine, Ser: 0.95 mg/dL (ref 0.44–1.00)
GFR, Estimated: >60 mL/min (ref >60)
Glucose, Bld: 173 mg/dL — ABNORMAL HIGH (ref 70–99)
Potassium: 3.7 mmol/L (ref 3.5–5.1)
Sodium: 138 mmol/L (ref 135–145)

## 2021-10-23 ENCOUNTER — Encounter: Payer: Self-pay | Admitting: Anesthesiology

## 2021-10-23 ENCOUNTER — Encounter
Admission: RE | Disposition: A | Discharge status: Home / Self Care | Payer: Self-pay | Source: Ambulatory Visit | Attending: Otolaryngology

## 2021-10-23 ENCOUNTER — Encounter: Payer: Self-pay | Admitting: Otolaryngology

## 2021-10-23 ENCOUNTER — Ambulatory Visit
Admission: RE | Admit: 2021-10-23 | Discharge: 2021-10-23 | Disposition: A | Discharge status: Home / Self Care | Payer: Medicare HMO | Source: Ambulatory Visit | Attending: Otolaryngology | Admitting: Otolaryngology

## 2021-10-23 ENCOUNTER — Other Ambulatory Visit: Payer: Self-pay

## 2021-10-23 DIAGNOSIS — D141 Benign neoplasm of larynx: Secondary | ICD-10-CM | POA: Diagnosis not present

## 2021-10-23 DIAGNOSIS — K219 Gastro-esophageal reflux disease without esophagitis: Secondary | ICD-10-CM | POA: Diagnosis not present

## 2021-10-23 DIAGNOSIS — F17210 Nicotine dependence, cigarettes, uncomplicated: Secondary | ICD-10-CM | POA: Diagnosis not present

## 2021-10-23 DIAGNOSIS — J381 Polyp of vocal cord and larynx: Secondary | ICD-10-CM | POA: Diagnosis not present

## 2021-10-23 HISTORY — DX: Unspecified asthma, uncomplicated: J45.909

## 2021-10-23 HISTORY — DX: Atherosclerotic heart disease of native coronary artery without angina pectoris: I25.10

## 2021-10-23 HISTORY — DX: Gastro-esophageal reflux disease without esophagitis: K21.9

## 2021-10-23 HISTORY — DX: Chronic obstructive pulmonary disease, unspecified: J44.9

## 2021-10-23 HISTORY — DX: Lumbago with sciatica, right side: M54.42

## 2021-10-23 HISTORY — DX: Other intervertebral disc degeneration, lumbar region without mention of lumbar back pain or lower extremity pain: M51.369

## 2021-10-23 HISTORY — PX: MICROLARYNGOSCOPY: SHX5208

## 2021-10-23 HISTORY — DX: Lumbago with sciatica, right side: M54.41

## 2021-10-23 HISTORY — DX: Other intervertebral disc degeneration, lumbar region: M51.36

## 2021-10-23 LAB — GLUCOSE, CAPILLARY
Glucose-Capillary: 193 mg/dL — ABNORMAL HIGH (ref 70–99)
Glucose-Capillary: 234 mg/dL — ABNORMAL HIGH (ref 70–99)

## 2021-10-23 SURGERY — MICROLARYNGOSCOPY
Anesthesia: General | Laterality: Bilateral

## 2021-10-23 MED ORDER — SEVOFLURANE IN SOLN
RESPIRATORY_TRACT | Status: AC
  Filled 2021-10-23: qty 250

## 2021-10-23 MED ORDER — OXYCODONE HCL 5 MG/5ML PO SOLN: 5.0000 mg | Freq: Once | ORAL | Status: AC | PRN

## 2021-10-23 MED ORDER — PROPOFOL 10 MG/ML IV BOLUS
INTRAVENOUS | Status: AC
  Filled 2021-10-23: qty 20

## 2021-10-23 MED ORDER — MIDAZOLAM HCL 2 MG/2ML IJ SOLN
INTRAMUSCULAR | Status: DC | PRN
  Administered 2021-10-23 (×2): 2 mg via INTRAVENOUS

## 2021-10-23 MED ORDER — TRIAMCINOLONE ACETONIDE 40 MG/ML IJ SUSP
INTRAMUSCULAR | Status: AC
  Filled 2021-10-23: qty 1

## 2021-10-23 MED ORDER — FENTANYL CITRATE (PF) 100 MCG/2ML IJ SOLN: 25.0000 ug | INTRAMUSCULAR | Status: DC | PRN

## 2021-10-23 MED ORDER — MIDAZOLAM HCL 2 MG/2ML IJ SOLN
INTRAMUSCULAR | Status: AC
  Filled 2021-10-23: qty 2

## 2021-10-23 MED ORDER — ROCURONIUM BROMIDE 100 MG/10ML IV SOLN
INTRAVENOUS | Status: DC | PRN
  Administered 2021-10-23: 40 mg via INTRAVENOUS

## 2021-10-23 MED ORDER — SODIUM CHLORIDE 0.9 % IV SOLN: INTRAVENOUS | Status: DC

## 2021-10-23 MED ORDER — FENTANYL CITRATE (PF) 100 MCG/2ML IJ SOLN
INTRAMUSCULAR | Status: AC
  Filled 2021-10-23: qty 2

## 2021-10-23 MED ORDER — LIDOCAINE-EPINEPHRINE (PF) 1 %-1:200000 IJ SOLN
INTRAMUSCULAR | Status: AC
  Filled 2021-10-23: qty 20

## 2021-10-23 MED ORDER — ORAL CARE MOUTH RINSE: 15.0000 mL | Freq: Once | OROMUCOSAL | Status: AC

## 2021-10-23 MED ORDER — PHENYLEPHRINE HCL (PRESSORS) 10 MG/ML IV SOLN
INTRAVENOUS | Status: DC | PRN
  Administered 2021-10-23: 80 ug via INTRAVENOUS
  Administered 2021-10-23: 160 ug via INTRAVENOUS

## 2021-10-23 MED ORDER — ACETAMINOPHEN 10 MG/ML IV SOLN
INTRAVENOUS | Status: AC
  Filled 2021-10-23: qty 100

## 2021-10-23 MED ORDER — ACETAMINOPHEN 10 MG/ML IV SOLN
INTRAVENOUS | Status: DC | PRN
  Administered 2021-10-23: 1000 mg via INTRAVENOUS

## 2021-10-23 MED ORDER — ONDANSETRON HCL 4 MG/2ML IJ SOLN: 4.0000 mg | Freq: Once | INTRAMUSCULAR | Status: DC | PRN

## 2021-10-23 MED ORDER — SUCCINYLCHOLINE CHLORIDE 200 MG/10ML IV SOSY
PREFILLED_SYRINGE | INTRAVENOUS | Status: AC
  Filled 2021-10-23: qty 10

## 2021-10-23 MED ORDER — LACTATED RINGERS IV SOLN: INTRAVENOUS | Status: DC | PRN

## 2021-10-23 MED ORDER — OXYCODONE HCL 5 MG PO TABS
5.0000 mg | ORAL_TABLET | Freq: Once | ORAL | Status: AC | PRN
  Administered 2021-10-23: 5 mg via ORAL

## 2021-10-23 MED ORDER — OXYMETAZOLINE HCL 0.05 % NA SOLN
NASAL | Status: DC | PRN
  Administered 2021-10-23: 1 via TOPICAL

## 2021-10-23 MED ORDER — GLYCOPYRROLATE 0.2 MG/ML IJ SOLN
INTRAMUSCULAR | Status: AC
  Filled 2021-10-23: qty 1

## 2021-10-23 MED ORDER — ONDANSETRON HCL 4 MG/2ML IJ SOLN
INTRAMUSCULAR | Status: AC
  Filled 2021-10-23: qty 2

## 2021-10-23 MED ORDER — OXYMETAZOLINE HCL 0.05 % NA SOLN
NASAL | Status: AC
  Filled 2021-10-23: qty 30

## 2021-10-23 MED ORDER — SUCCINYLCHOLINE CHLORIDE 200 MG/10ML IV SOSY
PREFILLED_SYRINGE | INTRAVENOUS | Status: DC | PRN
  Administered 2021-10-23: 120 mg via INTRAVENOUS

## 2021-10-23 MED ORDER — ROCURONIUM BROMIDE 10 MG/ML (PF) SYRINGE
PREFILLED_SYRINGE | INTRAVENOUS | Status: AC
  Filled 2021-10-23: qty 10

## 2021-10-23 MED ORDER — DEXMEDETOMIDINE HCL IN NACL 200 MCG/50ML IV SOLN
INTRAVENOUS | Status: AC
  Filled 2021-10-23: qty 50

## 2021-10-23 MED ORDER — CHLORHEXIDINE GLUCONATE 0.12 % MT SOLN
OROMUCOSAL | Status: AC
  Administered 2021-10-23: 15 mL via OROMUCOSAL
  Filled 2021-10-23: qty 15

## 2021-10-23 MED ORDER — PHENYLEPHRINE HCL (PRESSORS) 10 MG/ML IV SOLN
INTRAVENOUS | Status: AC
  Filled 2021-10-23: qty 1

## 2021-10-23 MED ORDER — ONDANSETRON HCL 4 MG/2ML IJ SOLN
INTRAMUSCULAR | Status: DC | PRN
  Administered 2021-10-23: 4 mg via INTRAVENOUS

## 2021-10-23 MED ORDER — CHLORHEXIDINE GLUCONATE 0.12 % MT SOLN: 15.0000 mL | Freq: Once | OROMUCOSAL | Status: AC

## 2021-10-23 MED ORDER — FENTANYL CITRATE (PF) 100 MCG/2ML IJ SOLN
INTRAMUSCULAR | Status: DC | PRN
  Administered 2021-10-23: 50 ug via INTRAVENOUS
  Administered 2021-10-23 (×2): 25 ug via INTRAVENOUS

## 2021-10-23 MED ORDER — DEXAMETHASONE SODIUM PHOSPHATE 10 MG/ML IJ SOLN
INTRAMUSCULAR | Status: AC
  Filled 2021-10-23: qty 1

## 2021-10-23 MED ORDER — ACETAMINOPHEN 10 MG/ML IV SOLN: 1000.0000 mg | Freq: Once | INTRAVENOUS | Status: DC | PRN

## 2021-10-23 MED ORDER — GLYCOPYRROLATE 0.2 MG/ML IJ SOLN
INTRAMUSCULAR | Status: DC | PRN
  Administered 2021-10-23: .1 mg via INTRAVENOUS

## 2021-10-23 MED ORDER — SUGAMMADEX SODIUM 500 MG/5ML IV SOLN
INTRAVENOUS | Status: DC | PRN
  Administered 2021-10-23: 400 mg via INTRAVENOUS

## 2021-10-23 MED ORDER — PROPOFOL 10 MG/ML IV BOLUS
INTRAVENOUS | Status: DC | PRN
  Administered 2021-10-23: 30 mg via INTRAVENOUS
  Administered 2021-10-23: 130 mg via INTRAVENOUS
  Administered 2021-10-23: 40 mg via INTRAVENOUS

## 2021-10-23 MED ORDER — OXYCODONE HCL 5 MG PO TABS
ORAL_TABLET | ORAL | Status: AC
  Filled 2021-10-23: qty 1

## 2021-10-23 MED ORDER — DEXAMETHASONE SODIUM PHOSPHATE 10 MG/ML IJ SOLN
INTRAMUSCULAR | Status: DC | PRN
  Administered 2021-10-23: 5 mg via INTRAVENOUS

## 2021-10-23 MED ORDER — LIDOCAINE HCL (PF) 2 % IJ SOLN
INTRAMUSCULAR | Status: AC
  Filled 2021-10-23: qty 5

## 2021-10-23 MED ORDER — DEXMEDETOMIDINE (PRECEDEX) IN NS 20 MCG/5ML (4 MCG/ML) IV SYRINGE
PREFILLED_SYRINGE | INTRAVENOUS | Status: DC | PRN
  Administered 2021-10-23: 8 ug via INTRAVENOUS
  Administered 2021-10-23: 12 ug via INTRAVENOUS

## 2021-10-23 MED ORDER — HYDROCODONE-ACETAMINOPHEN 5-325 MG PO TABS
1.0000 | ORAL_TABLET | Freq: Four times a day (QID) | ORAL | 0 refills | Status: DC | PRN
Start: 2021-10-23 — End: 2022-05-02

## 2021-10-23 MED ORDER — LIDOCAINE HCL (CARDIAC) PF 100 MG/5ML IV SOSY
PREFILLED_SYRINGE | INTRAVENOUS | Status: DC | PRN
  Administered 2021-10-23: 100 mg via INTRAVENOUS

## 2021-10-23 MED ORDER — 0.9 % SODIUM CHLORIDE (POUR BTL) OPTIME
TOPICAL | Status: DC | PRN
  Administered 2021-10-23: 50 mL

## 2021-10-23 SURGICAL SUPPLY — 26 items
BLOCK BITE GUARD (MISCELLANEOUS) ×2 IMPLANT
CNTNR SPEC 2.5X3XGRAD LEK (MISCELLANEOUS) ×1
CONT SPEC 4OZ STER OR WHT (MISCELLANEOUS) ×1
CONT SPEC 4OZ STRL OR WHT (MISCELLANEOUS) ×1
CONTAINER SPEC 2.5X3XGRAD LEK (MISCELLANEOUS) IMPLANT
COVER BACK TABLE REUSABLE LG (DRAPES) ×2 IMPLANT
COVER MAYO STAND STRL (DRAPES) ×2 IMPLANT
CUP MEDICINE 2OZ PLAST GRAD ST (MISCELLANEOUS) ×2 IMPLANT
DEFOGGER ANTIFOG KIT (MISCELLANEOUS) ×2 IMPLANT
DRSG TELFA 4X3 1S NADH ST (GAUZE/BANDAGES/DRESSINGS) ×2 IMPLANT
GAUZE 4X4 16PLY ~~LOC~~+RFID DBL (SPONGE) ×2 IMPLANT
GLOVE SURG ENC MOIS LTX SZ7.5 (GLOVE) ×2 IMPLANT
GOWN STRL REUS W/ TWL LRG LVL3 (GOWN DISPOSABLE) ×1 IMPLANT
GOWN STRL REUS W/TWL LRG LVL3 (GOWN DISPOSABLE) ×2
KIT TURNOVER KIT A (KITS) ×2 IMPLANT
MANIFOLD NEPTUNE II (INSTRUMENTS) ×2 IMPLANT
MARKER SKIN DUAL TIP RULER LAB (MISCELLANEOUS) ×2 IMPLANT
NDL 18GX1X1/2 (RX/OR ONLY) (NEEDLE) ×1 IMPLANT
NDL HYPO 25GX1X1/2 BEV (NEEDLE) IMPLANT
NEEDLE 18GX1X1/2 (RX/OR ONLY) (NEEDLE) ×2 IMPLANT
NEEDLE HYPO 25GX1X1/2 BEV (NEEDLE) ×2 IMPLANT
NS IRRIG 500ML POUR BTL (IV SOLUTION) ×1 IMPLANT
PATTIES SURGICAL .5 X.5 (GAUZE/BANDAGES/DRESSINGS) ×2 IMPLANT
SYR 3ML LL SCALE MARK (SYRINGE) ×1 IMPLANT
TOWEL OR 17X26 4PK STRL BLUE (TOWEL DISPOSABLE) ×2 IMPLANT
TUBING CONNECTING 10 (TUBING) ×2 IMPLANT

## 2021-10-23 NOTE — Anesthesia Procedure Notes (Addendum)
Procedure Name: Intubation Date/Time: 10/23/2021 8:40 AM Performed by: Doreen Salvage, CRNA Pre-anesthesia Checklist: Patient identified, Emergency Drugs available, Suction available and Patient being monitored Patient Re-evaluated:Patient Re-evaluated prior to induction Oxygen Delivery Method: Circle system utilized Preoxygenation: Pre-oxygenation with 100% oxygen Induction Type: IV induction, Cricoid Pressure applied and Rapid sequence Ventilation: Mask ventilation without difficulty Laryngoscope Size: Mac and 3 Grade View: Grade II Tube type: MLT Tube size: 5.0 mm Number of attempts: 1 Airway Equipment and Method: Stylet Placement Confirmation: ETT inserted through vocal cords under direct vision, positive ETCO2 and breath sounds checked- equal and bilateral Tube secured with: Tape Dental Injury: Teeth and Oropharynx as per pre-operative assessment  Comments: ALL TEETH INTACT AFTER INTUBATION. PATIENT TOLERATED WELL

## 2021-10-23 NOTE — Transfer of Care (Signed)
Immediate Anesthesia Transfer of Care Note  Patient: Anne Brennan  Procedure(s) Performed: MICROLARYNGOSCOPY WITH EXCISION VOCAL CORD POLYPS (Bilateral)  Patient Location: PACU  Anesthesia Type:General  Level of Consciousness: drowsy  Airway & Oxygen Therapy: Patient Spontanous Breathing and Patient connected to face mask oxygen  Post-op Assessment: Report given to RN and Post -op Vital signs reviewed and stable  Post vital signs: Reviewed and stable  Last Vitals:  Vitals Value Taken Time  BP 186/83 10/23/21 0945  Temp    Pulse 101 10/23/21 0948  Resp 18 10/23/21 0948  SpO2 97 % 10/23/21 0948  Vitals shown include unvalidated device data.  Last Pain:  Vitals:   10/23/21 0741  TempSrc: Oral  PainSc: 6          Complications: No notable events documented.

## 2021-10-23 NOTE — Op Note (Signed)
10/23/2021  9:20 AM    Anne Brennan  [id number]    Pre-Op Diagnosis:  BILATERAL VOCAL CORD POLYPS  Post-op Diagnosis: BILATERAL VOCAL CORD POLYPS  Procedure:  Microlaryngoscopy with Biopsy  Surgeon:  Riley Nearing., MD  Anesthesia:  General Endotracheal  EBL:  minimal  Complications:  None  Findings: Bilateral vocal cord polyps, larger on the right where the polyp was bilobed.  Procedure: With the patient in a comfortable supine position, general endotracheal anesthesia was induced without difficulty.  At an appropriate level, the table was turned 90 degrees away from Anesthesia.  A clean preparation and draping was performed in the standard fashion. The oropharynx and oral cavity were inspected.  Patient had a missing right incisor and the left incisor had decay at the base and was slightly loose with about a third of the base of the tooth decayed away. A tooth guard was placed. Using the Dedo laryngoscope, the oropharynx, hypopharynx and larynx were carefully inspected.   The polyps were visualized in the endolarynx.  The larynx was somewhat anterior, complicating visualization somewhat.  External pressure on the larynx allowed visualization of the anterior aspect of the larynx more effectively.  The vocal cords were inspected with bilateral polyps noted and photodocumentation obtained with the 0 degree scope.  The operating microscope was moved into position to allow better visualization with magnification.  The right polyp was grasped with cup forceps and carefully excised with microsurgical angled scissors, taking care to avoid injury to the underlying vocalis muscle.  The polyp was sent as a specimen.  The left polyp was carefully excised in the same fashion.  Afrin moistened pledgets were used to control minor oozing.  Cup forceps were used to remove some residual polypoid tissue from the vocalis muscle.  Photodocumentation of the postoperative appearance of the larynx  was obtained. The laryngoscope was removed.  At this point the procedure was completed.  The decayed incisor unfortunately had broken off despite having a tooth guard in place.  This was removed..  The patient was returned to Anesthesia, awakened, extubated, and transferred to PACU in satisfactory condition.   Disposition: To PACU, then discharge home  Plan: Soft, bland diet, advance as tolerated. Take pain medications as prescribed. Follow-up in 3 weeks.  Riley Nearing 10/23/2021 9:20 AM

## 2021-10-23 NOTE — Anesthesia Preprocedure Evaluation (Addendum)
Anesthesia Evaluation  Patient identified by MRN, date of birth, ID band Patient awake  General Assessment Comment:  Vocal cord polyps, patient's voice extremely horse. Poorly controlled GERD. Patient says she has a difficult time lying flat.  Reviewed: Allergy & Precautions, NPO status , Patient's Chart, lab work & pertinent test results  History of Anesthesia Complications Negative for: history of anesthetic complications  Airway Mallampati: II  TM Distance: >3 FB Neck ROM: Full    Dental  (+) Poor Dentition, Missing, Chipped   Pulmonary neg sleep apnea, COPD,  COPD inhaler, Current Smoker and Patient abstained from smoking.,    Pulmonary exam normal breath sounds clear to auscultation       Cardiovascular Exercise Tolerance: Good METShypertension, Pt. on medications + CAD  (-) Past MI (-) dysrhythmias  Rhythm:Regular Rate:Normal - Systolic murmurs Holter study was completed showing a predominate underlying NSR with no significant dysrhythmia or ectopy. Patient cancelled the ordered Lexiscan.  Cardiology provider that patient would be appropriate to proceed with planned surgical intervention at the LOW stratification originally issued by Dr. Clayborn Bigness. Patient has also received clearance from medicine Doy Hutching, MD) to proceed at an ACCEPTABLE risk.   TTE unremarkable    Neuro/Psych PSYCHIATRIC DISORDERS Depression CVA, No Residual Symptoms    GI/Hepatic GERD  Poorly Controlled,(+)     (-) substance abuse  ,   Endo/Other  diabetes  Renal/GU negative Renal ROS     Musculoskeletal  (+) Fibromyalgia -  Abdominal   Peds  Hematology   Anesthesia Other Findings Past Medical History: No date: Asthma No date: Bilateral low back pain with bilateral sciatica No date: Bulging lumbar disc No date: COPD (chronic obstructive pulmonary disease) (HCC) No date: Coronary artery disease No date: Diabetes mellitus without  complication (HCC) No date: Fibromyalgia No date: GERD (gastroesophageal reflux disease) No date: Hyperlipidemia No date: Hypertension 04/2021: Stroke [facility])  Reproductive/Obstetrics                            Anesthesia Physical Anesthesia Plan  ASA: 3  Anesthesia Plan: General   Post-op Pain Management: Ofirmev IV (intra-op)   Induction: Intravenous and Rapid sequence  PONV Risk Score and Plan: 3 and Ondansetron, Dexamethasone and Midazolam  Airway Management Planned: Oral ETT and Video Laryngoscope Planned  Additional Equipment: None  Intra-op Plan:   Post-operative Plan: Extubation in OR  Informed Consent: I have reviewed the patients History and Physical, chart, labs and discussed the procedure including the risks, benefits and alternatives for the proposed anesthesia with the patient or authorized representative who has indicated his/her understanding and acceptance.     Dental advisory given  Plan Discussed with: CRNA and Surgeon  Anesthesia Plan Comments: (Discussed risks of anesthesia with patient, including PONV, sore throat, lip/dental/eye damage, aspiration. Rare risks discussed as well, such as cardiorespiratory and neurological sequelae, and allergic reactions. Patient counseled on benefits of smoking cessation, and increased perioperative risks associated with continued smoking. Discussed the role of CRNA in patient's perioperative care. Patient understands.  Plan for microlaryngoscopy tube and RSI)        Anesthesia Quick Evaluation

## 2021-10-23 NOTE — Discharge Instructions (Signed)

## 2021-10-23 NOTE — H&P (Signed)
History and physical reviewed and will be scanned in later. No change in medical status reported by the patient or family, appears stable for surgery. All questions regarding the procedure answered, and patient (or family if a child) expressed understanding of the procedure. ? ?Anne Brennan S Anne Brennan ?@TODAY@ ?

## 2021-10-24 LAB — SURGICAL PATHOLOGY

## 2021-10-24 NOTE — Anesthesia Postprocedure Evaluation (Signed)
Anesthesia Post Note  Patient: Engineer, water  Procedure(s) Performed: MICROLARYNGOSCOPY WITH EXCISION VOCAL CORD POLYPS (Bilateral)  Patient location during evaluation: PACU Anesthesia Type: General Level of consciousness: awake and alert Pain management: pain level controlled Vital Signs Assessment: post-procedure vital signs reviewed and stable Respiratory status: spontaneous breathing, nonlabored ventilation, respiratory function stable and patient connected to nasal cannula oxygen Cardiovascular status: blood pressure returned to baseline and stable Postop Assessment: no apparent nausea or vomiting Anesthetic complications: no Comments:  As noted previously, patient's incisor was chipped off by surgical team. No anesthetic complications   No notable events documented.   Last Vitals:  Vitals:   10/23/21 1013 10/23/21 1023  BP: (!) 152/84 (!) 163/77  Pulse: 95 90  Resp: 19 16  Temp: (!) 36.1 C (!) 36.4 C  SpO2: 98% 92%    Last Pain:  Vitals:   10/23/21 1023  TempSrc: Temporal  PainSc: 7                  Arita Miss

## 2021-11-13 DIAGNOSIS — J381 Polyp of vocal cord and larynx: Secondary | ICD-10-CM | POA: Diagnosis not present

## 2021-11-13 DIAGNOSIS — J301 Allergic rhinitis due to pollen: Secondary | ICD-10-CM | POA: Diagnosis not present

## 2021-11-13 DIAGNOSIS — E040 Nontoxic diffuse goiter: Secondary | ICD-10-CM | POA: Diagnosis not present

## 2021-12-13 ENCOUNTER — Emergency Department
Admission: EM | Admit: 2021-12-13 | Discharge: 2021-12-13 | Disposition: A | Discharge status: Home / Self Care | Payer: Medicare HMO | Attending: Emergency Medicine | Admitting: Emergency Medicine

## 2021-12-13 ENCOUNTER — Emergency Department: Payer: Medicare HMO

## 2021-12-13 ENCOUNTER — Other Ambulatory Visit: Payer: Self-pay

## 2021-12-13 ENCOUNTER — Encounter: Payer: Self-pay | Admitting: Intensive Care

## 2021-12-13 DIAGNOSIS — E119 Type 2 diabetes mellitus without complications: Secondary | ICD-10-CM | POA: Diagnosis not present

## 2021-12-13 DIAGNOSIS — Z20822 Contact with and (suspected) exposure to covid-19: Secondary | ICD-10-CM | POA: Insufficient documentation

## 2021-12-13 DIAGNOSIS — M545 Low back pain, unspecified: Secondary | ICD-10-CM | POA: Insufficient documentation

## 2021-12-13 DIAGNOSIS — M25552 Pain in left hip: Secondary | ICD-10-CM | POA: Diagnosis not present

## 2021-12-13 DIAGNOSIS — I10 Essential (primary) hypertension: Secondary | ICD-10-CM | POA: Diagnosis not present

## 2021-12-13 DIAGNOSIS — R509 Fever, unspecified: Secondary | ICD-10-CM | POA: Diagnosis present

## 2021-12-13 DIAGNOSIS — J020 Streptococcal pharyngitis: Secondary | ICD-10-CM | POA: Diagnosis not present

## 2021-12-13 DIAGNOSIS — R000 Tachycardia, unspecified: Secondary | ICD-10-CM | POA: Diagnosis not present

## 2021-12-13 DIAGNOSIS — M5136 Other intervertebral disc degeneration, lumbar region: Secondary | ICD-10-CM | POA: Diagnosis not present

## 2021-12-13 DIAGNOSIS — I959 Hypotension, unspecified: Secondary | ICD-10-CM | POA: Diagnosis not present

## 2021-12-13 DIAGNOSIS — W19XXXA Unspecified fall, initial encounter: Secondary | ICD-10-CM | POA: Diagnosis not present

## 2021-12-13 DIAGNOSIS — R300 Dysuria: Secondary | ICD-10-CM | POA: Diagnosis not present

## 2021-12-13 DIAGNOSIS — M25551 Pain in right hip: Secondary | ICD-10-CM | POA: Insufficient documentation

## 2021-12-13 DIAGNOSIS — R350 Frequency of micturition: Secondary | ICD-10-CM | POA: Diagnosis not present

## 2021-12-13 DIAGNOSIS — M2578 Osteophyte, vertebrae: Secondary | ICD-10-CM | POA: Diagnosis not present

## 2021-12-13 DIAGNOSIS — R0902 Hypoxemia: Secondary | ICD-10-CM | POA: Diagnosis not present

## 2021-12-13 DIAGNOSIS — R739 Hyperglycemia, unspecified: Secondary | ICD-10-CM | POA: Diagnosis not present

## 2021-12-13 DIAGNOSIS — R42 Dizziness and giddiness: Secondary | ICD-10-CM | POA: Diagnosis not present

## 2021-12-13 DIAGNOSIS — R531 Weakness: Secondary | ICD-10-CM | POA: Insufficient documentation

## 2021-12-13 LAB — CBC WITH DIFFERENTIAL/PLATELET
Abs Immature Granulocytes: 0.06 10*3/uL (ref 0.00–0.07)
Basophils Absolute: 0 10*3/uL (ref 0.0–0.1)
Basophils Relative: 0 %
Eosinophils Absolute: 0 10*3/uL (ref 0.0–0.5)
Eosinophils Relative: 0 %
HCT: 40.5 % (ref 36.0–46.0)
Hemoglobin: 13.7 g/dL (ref 12.0–15.0)
Immature Granulocytes: 0 %
Lymphocytes Relative: 18 %
Lymphs Abs: 2.4 10*3/uL (ref 0.7–4.0)
MCH: 31.2 pg (ref 26.0–34.0)
MCHC: 33.8 g/dL (ref 30.0–36.0)
MCV: 92.3 fL (ref 80.0–100.0)
Monocytes Absolute: 1.3 10*3/uL — ABNORMAL HIGH (ref 0.1–1.0)
Monocytes Relative: 10 %
Neutro Abs: 9.7 10*3/uL — ABNORMAL HIGH (ref 1.7–7.7)
Neutrophils Relative %: 72 %
Platelets: 220 10*3/uL (ref 150–400)
RBC: 4.39 MIL/uL (ref 3.87–5.11)
RDW: 13 % (ref 11.5–15.5)
WBC: 13.5 10*3/uL — ABNORMAL HIGH (ref 4.0–10.5)
nRBC: 0 % (ref 0.0–0.2)

## 2021-12-13 LAB — COMPREHENSIVE METABOLIC PANEL
ALT: 14 U/L (ref 0–44)
AST: 22 U/L (ref 15–41)
Albumin: 4 g/dL (ref 3.5–5.0)
Alkaline Phosphatase: 80 U/L (ref 38–126)
Anion gap: 11 (ref 5–15)
BUN: 17 mg/dL (ref 8–23)
CO2: 24 mmol/L (ref 22–32)
Calcium: 8.5 mg/dL — ABNORMAL LOW (ref 8.9–10.3)
Chloride: 97 mmol/L — ABNORMAL LOW (ref 98–111)
Creatinine, Ser: 1.21 mg/dL — ABNORMAL HIGH (ref 0.44–1.00)
GFR, Estimated: 51 mL/min — ABNORMAL LOW (ref >60)
Glucose, Bld: 197 mg/dL — ABNORMAL HIGH (ref 70–99)
Potassium: 3.3 mmol/L — ABNORMAL LOW (ref 3.5–5.1)
Sodium: 132 mmol/L — ABNORMAL LOW (ref 135–145)
Total Bilirubin: 0.7 mg/dL (ref 0.3–1.2)
Total Protein: 8.3 g/dL — ABNORMAL HIGH (ref 6.5–8.1)

## 2021-12-13 LAB — LACTIC ACID, PLASMA
Lactic Acid, Venous: 1.8 mmol/L (ref 0.5–1.9)
Lactic Acid, Venous: 1.1 mmol/L (ref 0.5–1.9)

## 2021-12-13 LAB — CBG MONITORING, ED: Glucose-Capillary: 192 mg/dL — ABNORMAL HIGH (ref 70–99)

## 2021-12-13 LAB — RESP PANEL BY RT-PCR (FLU A&B, COVID) ARPGX2
Influenza A by PCR: NEGATIVE
Influenza B by PCR: NEGATIVE
SARS Coronavirus 2 by RT PCR: NEGATIVE

## 2021-12-13 LAB — URINALYSIS, ROUTINE W REFLEX MICROSCOPIC
Bacteria, UA: NONE SEEN
Bilirubin Urine: NEGATIVE
Glucose, UA: NEGATIVE mg/dL
Ketones, ur: NEGATIVE mg/dL
Leukocytes,Ua: NEGATIVE
Nitrite: NEGATIVE
Protein, ur: NEGATIVE mg/dL
Specific Gravity, Urine: 1.006 (ref 1.005–1.030)
pH: 6 (ref 5.0–8.0)

## 2021-12-13 LAB — GROUP A STREP BY PCR: Group A Strep by PCR: DETECTED — AB

## 2021-12-13 LAB — PROCALCITONIN: Procalcitonin: 0.16 ng/mL

## 2021-12-13 MED ORDER — CEFDINIR 300 MG PO CAPS: 300.0000 mg | ORAL_CAPSULE | Freq: Two times a day (BID) | ORAL | 0 refills | Status: AC

## 2021-12-13 MED ORDER — LACTATED RINGERS IV BOLUS
1000.0000 mL | Freq: Once | INTRAVENOUS | Status: AC
  Administered 2021-12-13: 1000 mL via INTRAVENOUS

## 2021-12-13 MED ORDER — CEFDINIR 300 MG PO CAPS
300.0000 mg | ORAL_CAPSULE | Freq: Once | ORAL | Status: AC
  Administered 2021-12-13: 300 mg via ORAL
  Filled 2021-12-13: qty 1

## 2021-12-13 MED ORDER — ONDANSETRON 4 MG PO TBDP: 4.0000 mg | ORAL_TABLET | Freq: Three times a day (TID) | ORAL | 0 refills | Status: DC | PRN

## 2021-12-13 MED ORDER — METHOCARBAMOL 500 MG PO TABS
500.0000 mg | ORAL_TABLET | Freq: Once | ORAL | Status: AC
  Administered 2021-12-13: 500 mg via ORAL
  Filled 2021-12-13: qty 1

## 2021-12-13 MED ORDER — ACETAMINOPHEN 325 MG PO TABS
650.0000 mg | ORAL_TABLET | Freq: Once | ORAL | Status: AC | PRN
  Administered 2021-12-13: 650 mg via ORAL
  Filled 2021-12-13: qty 2

## 2021-12-13 NOTE — Discharge Instructions (Addendum)
Use Tylenol for pain and fevers.  Up to 1000 mg per dose, up to 4 times per day.  Do not take more than 4000 mg of Tylenol/acetaminophen within 24 hours..  Use the cefdinir/Omnicef antibiotic twice daily for the next 1 week.  Use Zofran as needed for nausea and vomiting.  If she worsens, please bring her back to the ED.

## 2021-12-13 NOTE — ED Triage Notes (Signed)
Pt comes into the ED via [facility] from home c/o fall from dizziness and having her legs being weak.  Pt did not hit her head.  Pt had a stroke last July and recent throat surgery in January.   110/59 105 HR 103.4 oral 246 CBG 20g R. Wrist 93% RA

## 2021-12-13 NOTE — ED Provider Notes (Signed)
[facility] Provider Note    Event Date/Time   First MD Initiated Contact with Patient 12/13/21 1810     (approximate)   History   Fall   HPI  Anne Brennan is a 61 y.o. female who presents to the ED for evaluation of Fall   I review ENT procedure note from 1/4.  Patient had microlaryngoscopy with biopsy of bilateral vocal cord polyps.  Otherwise history of HTN, HLD, DM, DAPT with Plavix.  Patient presents to the ED, accompanied by family, for evaluation of a fall after feeling poorly for the past 2 or 3 days.  Patient reports that she has been feeling increasingly weak over the past couple days and had a fever earlier today at home.  She reports her legs giving out her falling backwards.  She does not think that she lost consciousness.  She reports pain to her bilateral hips and lower back.  She reports a sore throat.  Reports shortness of breath and nonproductive cough.  Reports urinary frequency without dysuria.  Physical Exam   Triage Vital Signs: ED Triage Vitals  Enc Vitals Group     BP 12/13/21 1755 134/79     Pulse Rate 12/13/21 1755 (!) 110     Resp 12/13/21 1755 18     Temp 12/13/21 1755 (!) 101.6 F (38.7 C)     Temp Source 12/13/21 1755 Oral     SpO2 12/13/21 1755 95 %     Weight 12/13/21 1756 180 lb (81.6 kg)     Height 12/13/21 1756 5\' 4"  (1.626 m)     Head Circumference --      Peak Flow --      Pain Score 12/13/21 1755 10     Pain Loc --      Pain Edu? --      Excl. in [place]? --     Most recent vital signs: Vitals:   12/13/21 2030 12/13/21 2051  BP: (!) 149/73   Pulse: (!) 104   Resp: 19   Temp:  100.2 F (37.9 C)  SpO2: 94%     General: Sleepy, but arousable by voice and able to hold a conversation.  Follows commands in all 4.  Warm to the touch. CV:  Good peripheral perfusion.  Tachycardic and regular. Resp:    Tachypneic. Abd:  No distention.  Suprapubic tenderness without peritoneal features.  Abdomen is  otherwise benign. MSK:  No deformity noted.  No pain with logrolling bilateral lower extremities.  Mild and poorly localizing tenderness to lumbar spine without bony step-offs or point tenderness. Neuro:  No focal deficits appreciated. Other:     ED Results / Procedures / Treatments   Labs (all labs ordered are listed, but only abnormal results are displayed) Labs Reviewed  GROUP A STREP BY PCR - Abnormal; Notable for the following components:      Result Value   Group A Strep by PCR DETECTED (*)    All other components within normal limits  COMPREHENSIVE METABOLIC PANEL - Abnormal; Notable for the following components:   Sodium 132 (*)    Potassium 3.3 (*)    Chloride 97 (*)    Glucose, Bld 197 (*)    Creatinine, Ser 1.21 (*)    Calcium 8.5 (*)    Total Protein 8.3 (*)    GFR, Estimated 51 (*)    All other components within normal limits  CBC WITH DIFFERENTIAL/PLATELET - Abnormal; Notable for the following components:  WBC 13.5 (*)    Neutro Abs 9.7 (*)    Monocytes Absolute 1.3 (*)    All other components within normal limits  URINALYSIS, ROUTINE W REFLEX MICROSCOPIC - Abnormal; Notable for the following components:   Color, Urine STRAW (*)    APPearance CLEAR (*)    Hgb urine dipstick MODERATE (*)    All other components within normal limits  CBG MONITORING, ED - Abnormal; Notable for the following components:   Glucose-Capillary 192 (*)    All other components within normal limits  RESP PANEL BY RT-PCR (FLU A&B, COVID) ARPGX2  CULTURE, BLOOD (ROUTINE X 2)  CULTURE, BLOOD (ROUTINE X 2)  LACTIC ACID, PLASMA  LACTIC ACID, PLASMA  PROCALCITONIN  PROCALCITONIN  CBG MONITORING, ED    EKG Sinus tachycardia with a rate of 111 bpm.  Normal axis and intervals.  No STEMI.  Nonspecific lateral and inferior ST changes.  RADIOLOGY CXR reviewed by me without evidence of acute cardiopulmonary pathology. Plain film of the lumbar spine reviewed by me without evidence of acute  fracture or dislocation.  Official radiology report(s): DG Chest 2 View  Result Date: 12/13/2021 CLINICAL DATA:  Dizziness and weakness with subsequent fall. EXAM: CHEST - 2 VIEW COMPARISON:  October 14, 2017 FINDINGS: The heart size and mediastinal contours are within normal limits. Both lungs are clear. Multilevel degenerative changes are seen throughout the thoracic spine. IMPRESSION: No active cardiopulmonary disease. Electronically Signed   By: Virgina Norfolk M.D.   On: 12/13/2021 19:35   DG Lumbar Spine Complete  Result Date: 12/13/2021 CLINICAL DATA:  Dizziness and weakness with subsequent fall. EXAM: LUMBAR SPINE - COMPLETE 4+ VIEW COMPARISON:  None. FINDINGS: There is no evidence of an acute lumbar spine fracture. Alignment is normal. Moderate severity multilevel endplate sclerosis with moderate to marked severity anterior and lateral osteophyte formation are seen throughout all levels of the lumbar spine. Mild multilevel intervertebral disc space narrowing is seen. This is slightly more prominent at the level of L5-S1. IMPRESSION: Marked  severity multilevel degenerative disc disease. Electronically Signed   By: Virgina Norfolk M.D.   On: 12/13/2021 19:37    PROCEDURES and INTERVENTIONS:  .1-3 Lead EKG Interpretation Performed by: Vladimir Crofts, MD Authorized by: Vladimir Crofts, MD     Interpretation: abnormal     ECG rate:  102   ECG rate assessment: tachycardic     Rhythm: sinus tachycardia     Ectopy: none     Conduction: normal   .Critical Care Performed by: Vladimir Crofts, MD Authorized by: Vladimir Crofts, MD   Critical care provider statement:    Critical care time (minutes):  30   Critical care time was exclusive of:  Separately billable procedures and treating other patients   Critical care was necessary to treat or prevent imminent or life-threatening deterioration of the following conditions:  Sepsis   Critical care was time spent personally by me on the following  activities:  Development of treatment plan with patient or surrogate, discussions with consultants, evaluation of patient's response to treatment, examination of patient, ordering and review of laboratory studies, ordering and review of radiographic studies, ordering and performing treatments and interventions, pulse oximetry, re-evaluation of patient's condition and review of old charts  Medications  acetaminophen (TYLENOL) tablet 650 mg (650 mg Oral Given 12/13/21 1802)  lactated ringers bolus 1,000 mL (0 mLs Intravenous Stopped 12/13/21 1927)  methocarbamol (ROBAXIN) tablet 500 mg (500 mg Oral Given 12/13/21 2016)  cefdinir (OMNICEF) capsule 300  mg (300 mg Oral Given 12/13/21 2019)     IMPRESSION / MDM / ASSESSMENT AND PLAN / ED COURSE  I reviewed the triage vital signs and the nursing notes.  61 year old female presents to the ED after her legs gave out and mechanical fall, likely due to generalized weakness in the setting of strep throat.  She is febrile and tachycardic, improving with antipyretics and fluids.  Remained stable without signs of shock.  Blood work with mild leukocytosis.  She is meeting sepsis criteria and I urged her to stay in the hospital for observation overnight, but she declines.  She is positive for strep throat on swabbing and metabolic panel with slight decrement to her renal dysfunction without criteria for AKI.  No lactic acidosis and her procalcitonin is only slightly elevated.  She is not altered and has capacity make this decision, acknowledging the risks of going home the setting of sepsis.  No signs of upper airway obstruction or voice changes, no medications to CT her soft tissue neck.  She is going home with her sister and daughter who agree to look after her.  I again urged him to stay, but she wants to go.  We discussed return precautions.  Clinical Course as of 12/13/21 2323  Ludwig Clarks Dec 13, 2021  2042 Reassessed and discussed strep throat.  We discussed actually  meeting sepsis criteria and we discussed disposition.  My recommendation was medical admission, but she declined indicating that she is most to go home.  Family at the bedside, her sister and daughter, report that they can look after her and will bring her back if she worsens.  We discussed return precautions and following up with her PCP. [DS]    Clinical Course User Index [DS] Vladimir Crofts, MD     FINAL CLINICAL IMPRESSION(S) / ED DIAGNOSES   Final diagnoses:  Strep pharyngitis     Rx / DC Orders   ED Discharge Orders          Ordered    cefdinir (OMNICEF) 300 MG capsule  2 times daily        12/13/21 2043    ondansetron (ZOFRAN-ODT) 4 MG disintegrating tablet  Every 8 hours PRN        12/13/21 2043             Note:  This document was prepared using Dragon voice recognition software and may include unintentional dictation errors.   Vladimir Crofts, MD 12/13/21 2325

## 2021-12-14 LAB — BLOOD CULTURE ID PANEL (REFLEXED) - BCID2

## 2021-12-16 ENCOUNTER — Telehealth: Payer: Self-pay | Admitting: Emergency Medicine

## 2021-12-16 LAB — CULTURE, BLOOD (ROUTINE X 2): Special Requests: ADEQUATE

## 2021-12-18 LAB — CULTURE, BLOOD (ROUTINE X 2)
Culture: NO GROWTH
Special Requests: ADEQUATE

## 2021-12-23 DIAGNOSIS — R413 Other amnesia: Secondary | ICD-10-CM | POA: Diagnosis not present

## 2021-12-23 DIAGNOSIS — M79602 Pain in left arm: Secondary | ICD-10-CM | POA: Diagnosis not present

## 2021-12-23 DIAGNOSIS — M79605 Pain in left leg: Secondary | ICD-10-CM | POA: Diagnosis not present

## 2021-12-23 DIAGNOSIS — M79601 Pain in right arm: Secondary | ICD-10-CM | POA: Diagnosis not present

## 2021-12-23 DIAGNOSIS — M79604 Pain in right leg: Secondary | ICD-10-CM | POA: Diagnosis not present

## 2021-12-23 DIAGNOSIS — Z8673 Personal history of transient ischemic attack (TIA), and cerebral infarction without residual deficits: Secondary | ICD-10-CM | POA: Diagnosis not present

## 2021-12-23 DIAGNOSIS — E118 Type 2 diabetes mellitus with unspecified complications: Secondary | ICD-10-CM | POA: Diagnosis not present

## 2021-12-31 DIAGNOSIS — R69 Illness, unspecified: Secondary | ICD-10-CM | POA: Diagnosis not present

## 2022-01-03 DIAGNOSIS — Z794 Long term (current) use of insulin: Secondary | ICD-10-CM | POA: Diagnosis not present

## 2022-01-03 DIAGNOSIS — E782 Mixed hyperlipidemia: Secondary | ICD-10-CM | POA: Diagnosis not present

## 2022-01-03 DIAGNOSIS — E118 Type 2 diabetes mellitus with unspecified complications: Secondary | ICD-10-CM | POA: Diagnosis not present

## 2022-01-03 DIAGNOSIS — I25118 Atherosclerotic heart disease of native coronary artery with other forms of angina pectoris: Secondary | ICD-10-CM | POA: Diagnosis not present

## 2022-01-03 DIAGNOSIS — Z1231 Encounter for screening mammogram for malignant neoplasm of breast: Secondary | ICD-10-CM | POA: Diagnosis not present

## 2022-01-03 DIAGNOSIS — I10 Essential (primary) hypertension: Secondary | ICD-10-CM | POA: Diagnosis not present

## 2022-01-03 DIAGNOSIS — Z79899 Other long term (current) drug therapy: Secondary | ICD-10-CM | POA: Diagnosis not present

## 2022-01-03 DIAGNOSIS — J431 Panlobular emphysema: Secondary | ICD-10-CM | POA: Diagnosis not present

## 2022-01-03 DIAGNOSIS — Z1211 Encounter for screening for malignant neoplasm of colon: Secondary | ICD-10-CM | POA: Diagnosis not present

## 2022-05-02 ENCOUNTER — Observation Stay
Admission: EM | Admit: 2022-05-02 | Discharge: 2022-05-02 | Disposition: A | Discharge status: Home / Self Care | Payer: Medicare Other | Attending: Internal Medicine | Admitting: Internal Medicine

## 2022-05-02 ENCOUNTER — Other Ambulatory Visit: Payer: Self-pay

## 2022-05-02 ENCOUNTER — Emergency Department: Payer: Medicare Other

## 2022-05-02 ENCOUNTER — Encounter: Payer: Self-pay | Admitting: Medical Oncology

## 2022-05-02 DIAGNOSIS — J42 Unspecified chronic bronchitis: Secondary | ICD-10-CM | POA: Diagnosis not present

## 2022-05-02 DIAGNOSIS — E1122 Type 2 diabetes mellitus with diabetic chronic kidney disease: Secondary | ICD-10-CM | POA: Insufficient documentation

## 2022-05-02 DIAGNOSIS — I2510 Atherosclerotic heart disease of native coronary artery without angina pectoris: Secondary | ICD-10-CM | POA: Diagnosis not present

## 2022-05-02 DIAGNOSIS — N179 Acute kidney failure, unspecified: Secondary | ICD-10-CM

## 2022-05-02 DIAGNOSIS — F32A Depression, unspecified: Secondary | ICD-10-CM | POA: Diagnosis present

## 2022-05-02 DIAGNOSIS — E119 Type 2 diabetes mellitus without complications: Secondary | ICD-10-CM

## 2022-05-02 DIAGNOSIS — Z8673 Personal history of transient ischemic attack (TIA), and cerebral infarction without residual deficits: Secondary | ICD-10-CM | POA: Diagnosis present

## 2022-05-02 DIAGNOSIS — Z7982 Long term (current) use of aspirin: Secondary | ICD-10-CM | POA: Diagnosis not present

## 2022-05-02 DIAGNOSIS — Z794 Long term (current) use of insulin: Secondary | ICD-10-CM | POA: Diagnosis not present

## 2022-05-02 DIAGNOSIS — Z79899 Other long term (current) drug therapy: Secondary | ICD-10-CM | POA: Insufficient documentation

## 2022-05-02 DIAGNOSIS — J449 Chronic obstructive pulmonary disease, unspecified: Secondary | ICD-10-CM | POA: Diagnosis not present

## 2022-05-02 DIAGNOSIS — I129 Hypertensive chronic kidney disease with stage 1 through stage 4 chronic kidney disease, or unspecified chronic kidney disease: Secondary | ICD-10-CM | POA: Insufficient documentation

## 2022-05-02 DIAGNOSIS — E876 Hypokalemia: Secondary | ICD-10-CM | POA: Diagnosis present

## 2022-05-02 DIAGNOSIS — J45909 Unspecified asthma, uncomplicated: Secondary | ICD-10-CM | POA: Insufficient documentation

## 2022-05-02 DIAGNOSIS — N1831 Chronic kidney disease, stage 3a: Secondary | ICD-10-CM | POA: Diagnosis not present

## 2022-05-02 DIAGNOSIS — F17210 Nicotine dependence, cigarettes, uncomplicated: Secondary | ICD-10-CM | POA: Diagnosis not present

## 2022-05-02 DIAGNOSIS — R079 Chest pain, unspecified: Secondary | ICD-10-CM | POA: Diagnosis not present

## 2022-05-02 DIAGNOSIS — F17200 Nicotine dependence, unspecified, uncomplicated: Secondary | ICD-10-CM | POA: Diagnosis present

## 2022-05-02 DIAGNOSIS — R072 Precordial pain: Secondary | ICD-10-CM | POA: Diagnosis present

## 2022-05-02 DIAGNOSIS — G459 Transient cerebral ischemic attack, unspecified: Secondary | ICD-10-CM | POA: Diagnosis present

## 2022-05-02 DIAGNOSIS — Z7984 Long term (current) use of oral hypoglycemic drugs: Secondary | ICD-10-CM | POA: Insufficient documentation

## 2022-05-02 DIAGNOSIS — I10 Essential (primary) hypertension: Secondary | ICD-10-CM | POA: Diagnosis present

## 2022-05-02 LAB — BASIC METABOLIC PANEL
Anion gap: 7 (ref 5–15)
BUN: 20 mg/dL (ref 8–23)
CO2: 27 mmol/L (ref 22–32)
Calcium: 9.3 mg/dL (ref 8.9–10.3)
Chloride: 105 mmol/L (ref 98–111)
Creatinine, Ser: 1.16 mg/dL — ABNORMAL HIGH (ref 0.44–1.00)
GFR, Estimated: 54 mL/min — ABNORMAL LOW (ref >60)
Glucose, Bld: 255 mg/dL — ABNORMAL HIGH (ref 70–99)
Potassium: 3.3 mmol/L — ABNORMAL LOW (ref 3.5–5.1)
Sodium: 139 mmol/L (ref 135–145)

## 2022-05-02 LAB — HEMOGLOBIN A1C
Hgb A1c MFr Bld: 8.7 % — ABNORMAL HIGH (ref 4.8–5.6)
Mean Plasma Glucose: 202.99 mg/dL

## 2022-05-02 LAB — TROPONIN I (HIGH SENSITIVITY)
Troponin I (High Sensitivity): 13 ng/L (ref <18)
Troponin I (High Sensitivity): 12 ng/L (ref <18)

## 2022-05-02 LAB — LIPID PANEL
Cholesterol: 114 mg/dL (ref 0–200)
HDL: 38 mg/dL — ABNORMAL LOW (ref >40)
LDL Cholesterol: 45 mg/dL (ref 0–99)
Total CHOL/HDL Ratio: 3 RATIO
Triglycerides: 156 mg/dL — ABNORMAL HIGH (ref <150)
VLDL: 31 mg/dL (ref 0–40)

## 2022-05-02 LAB — CBC
HCT: 42.2 % (ref 36.0–46.0)
Hemoglobin: 14.1 g/dL (ref 12.0–15.0)
MCH: 29.8 pg (ref 26.0–34.0)
MCHC: 33.4 g/dL (ref 30.0–36.0)
MCV: 89.2 fL (ref 80.0–100.0)
Platelets: 280 10*3/uL (ref 150–400)
RBC: 4.73 MIL/uL (ref 3.87–5.11)
RDW: 12.9 % (ref 11.5–15.5)
WBC: 6 10*3/uL (ref 4.0–10.5)
nRBC: 0 % (ref 0.0–0.2)

## 2022-05-02 LAB — PROTIME-INR
INR: 0.9 (ref 0.8–1.2)
Prothrombin Time: 12.5 seconds (ref 11.4–15.2)

## 2022-05-02 LAB — MAGNESIUM: Magnesium: 2.3 mg/dL (ref 1.7–2.4)

## 2022-05-02 LAB — APTT: aPTT: 31 seconds (ref 24–36)

## 2022-05-02 MED ORDER — ASPIRIN 325 MG PO TBEC
325.0000 mg | DELAYED_RELEASE_TABLET | Freq: Every day | ORAL | 1 refills | Status: AC
Start: 2022-05-03 — End: ?

## 2022-05-02 MED ORDER — ASPIRIN EC 325 MG PO TBEC
325.0000 mg | DELAYED_RELEASE_TABLET | Freq: Every day | ORAL | Status: DC
  Filled 2022-05-02: qty 1

## 2022-05-02 MED ORDER — INSULIN ASPART 100 UNIT/ML IJ SOLN: 0.0000 [IU] | Freq: Three times a day (TID) | INTRAMUSCULAR | Status: DC

## 2022-05-02 MED ORDER — INSULIN ASPART PROT & ASPART (70-30 MIX) 100 UNIT/ML ~~LOC~~ SUSP
33.0000 [IU] | Freq: Two times a day (BID) | SUBCUTANEOUS | Status: DC
  Filled 2022-05-02: qty 0.33

## 2022-05-02 MED ORDER — GABAPENTIN 100 MG PO CAPS: 400.0000 mg | ORAL_CAPSULE | Freq: Every day | ORAL | Status: DC

## 2022-05-02 MED ORDER — AMLODIPINE BESYLATE 5 MG PO TABS: 5.0000 mg | ORAL_TABLET | Freq: Every day | ORAL | Status: DC

## 2022-05-02 MED ORDER — ASPIRIN 81 MG PO CHEW
324.0000 mg | CHEWABLE_TABLET | Freq: Once | ORAL | Status: AC
  Administered 2022-05-02: 324 mg via ORAL
  Filled 2022-05-02: qty 4

## 2022-05-02 MED ORDER — GABAPENTIN 100 MG PO CAPS: 800.0000 mg | ORAL_CAPSULE | Freq: Two times a day (BID) | ORAL | Status: DC

## 2022-05-02 MED ORDER — MONTELUKAST SODIUM 10 MG PO TABS: 10.0000 mg | ORAL_TABLET | Freq: Every day | ORAL | Status: DC

## 2022-05-02 MED ORDER — ENOXAPARIN SODIUM 40 MG/0.4ML IJ SOSY
0.5000 mg/kg | PREFILLED_SYRINGE | INTRAMUSCULAR | Status: DC
Start: 2022-05-02 — End: 2022-05-03

## 2022-05-02 MED ORDER — METOPROLOL SUCCINATE ER 50 MG PO TB24: 50.0000 mg | ORAL_TABLET | ORAL | Status: DC

## 2022-05-02 MED ORDER — NICOTINE 21 MG/24HR TD PT24: 21.0000 mg | MEDICATED_PATCH | Freq: Every day | TRANSDERMAL | 0 refills | Status: DC

## 2022-05-02 MED ORDER — ONDANSETRON HCL 4 MG/2ML IJ SOLN: 4.0000 mg | Freq: Three times a day (TID) | INTRAMUSCULAR | Status: DC | PRN

## 2022-05-02 MED ORDER — BUPROPION HCL ER (SR) 100 MG PO TB12
100.0000 mg | ORAL_TABLET | Freq: Two times a day (BID) | ORAL | Status: DC
  Filled 2022-05-02: qty 1

## 2022-05-02 MED ORDER — MORPHINE SULFATE (PF) 2 MG/ML IV SOLN: 2.0000 mg | INTRAVENOUS | Status: DC | PRN

## 2022-05-02 MED ORDER — GABAPENTIN 100 MG PO CAPS: 400.0000 mg | ORAL_CAPSULE | Freq: Three times a day (TID) | ORAL | Status: DC

## 2022-05-02 MED ORDER — DM-GUAIFENESIN ER 30-600 MG PO TB12: 1.0000 | ORAL_TABLET | Freq: Two times a day (BID) | ORAL | Status: DC | PRN

## 2022-05-02 MED ORDER — ATORVASTATIN CALCIUM 20 MG PO TABS: 40.0000 mg | ORAL_TABLET | Freq: Every day | ORAL | Status: DC

## 2022-05-02 MED ORDER — TRIAMTERENE-HCTZ 37.5-25 MG PO TABS: 1.0000 | ORAL_TABLET | ORAL | Status: DC

## 2022-05-02 MED ORDER — LORATADINE 10 MG PO TABS: 10.0000 mg | ORAL_TABLET | Freq: Every evening | ORAL | Status: DC

## 2022-05-02 MED ORDER — NITROGLYCERIN 0.4 MG SL SUBL: 0.4000 mg | SUBLINGUAL_TABLET | SUBLINGUAL | Status: DC | PRN

## 2022-05-02 MED ORDER — TRAZODONE HCL 100 MG PO TABS: 100.0000 mg | ORAL_TABLET | Freq: Every day | ORAL | Status: DC

## 2022-05-02 MED ORDER — NICOTINE 21 MG/24HR TD PT24
21.0000 mg | MEDICATED_PATCH | Freq: Every day | TRANSDERMAL | Status: DC
  Administered 2022-05-02: 21 mg via TRANSDERMAL
  Filled 2022-05-02: qty 1

## 2022-05-02 MED ORDER — HYDROCODONE-ACETAMINOPHEN 5-325 MG PO TABS
2.0000 | ORAL_TABLET | Freq: Once | ORAL | Status: AC
  Administered 2022-05-02: 2 via ORAL
  Filled 2022-05-02: qty 2

## 2022-05-02 MED ORDER — PANTOPRAZOLE SODIUM 40 MG PO TBEC: 40.0000 mg | DELAYED_RELEASE_TABLET | Freq: Every day | ORAL | Status: DC

## 2022-05-02 MED ORDER — HYDRALAZINE HCL 20 MG/ML IJ SOLN
5.0000 mg | INTRAMUSCULAR | Status: DC | PRN
Start: 2022-05-02 — End: 2022-05-03

## 2022-05-02 MED ORDER — ISOSORBIDE MONONITRATE ER 30 MG PO TB24: 30.0000 mg | ORAL_TABLET | Freq: Every day | ORAL | 1 refills | Status: DC

## 2022-05-02 MED ORDER — ISOSORBIDE MONONITRATE ER 60 MG PO TB24: 30.0000 mg | ORAL_TABLET | Freq: Every day | ORAL | Status: DC

## 2022-05-02 MED ORDER — INSULIN ASPART 100 UNIT/ML IJ SOLN: 0.0000 [IU] | Freq: Every day | INTRAMUSCULAR | Status: DC

## 2022-05-02 MED ORDER — POTASSIUM CHLORIDE CRYS ER 20 MEQ PO TBCR
40.0000 meq | EXTENDED_RELEASE_TABLET | Freq: Once | ORAL | Status: AC
  Administered 2022-05-02: 40 meq via ORAL
  Filled 2022-05-02: qty 2

## 2022-05-02 MED ORDER — ALBUTEROL SULFATE (2.5 MG/3ML) 0.083% IN NEBU: 3.0000 mL | INHALATION_SOLUTION | RESPIRATORY_TRACT | Status: DC | PRN

## 2022-05-02 MED ORDER — LISINOPRIL 10 MG PO TABS: 10.0000 mg | ORAL_TABLET | Freq: Every day | ORAL | Status: DC

## 2022-05-02 MED ORDER — AMLODIPINE BESYLATE 5 MG PO TABS: 10.0000 mg | ORAL_TABLET | Freq: Every day | ORAL | Status: DC

## 2022-05-02 MED ORDER — ACETAMINOPHEN 325 MG PO TABS: 650.0000 mg | ORAL_TABLET | Freq: Four times a day (QID) | ORAL | Status: DC | PRN

## 2022-05-02 MED ORDER — NITROGLYCERIN 0.4 MG SL SUBL: 0.4000 mg | SUBLINGUAL_TABLET | SUBLINGUAL | 12 refills | Status: DC | PRN

## 2022-05-02 NOTE — Assessment & Plan Note (Signed)
Patient has exertional intermittent chest pain for more than 2 weeks.  Troponin negative x2.  Consulted cardiology, Dr. Nehemiah Massed evaluated patient. He added nitrate isosorbide 30 mg each day. Per Dr. Nehemiah Massed, if patient does not have further chest pain on ambulation, patient can be discharged home and follow-up with his cardiologist in 1 week for further other diagnostic testing if necessary. Pt did not have further chest pain on ambulation in ED.   - pt was placed  to tele bed for observation --> will d/c home and follow up with your cardiologist in 1 week - pt was treated with Nitroglycerin, Morphine, and aspirin, lipitor in hospital --> will continue ASA and lipitor - checked FLP and A1C, UDS - started nitrate isosorbide 30 mg

## 2022-05-02 NOTE — Consult Note (Signed)
[facility] Consultation Note  Patient ID: Anne Brennan, MRN: [id number], DOB/AGE: 1961-05-18 61 y.o. Admit date: 05/02/2022   Date of Consult: 05/02/2022 Primary Physician: Idelle Crouch, MD Primary Cardiologist: None  Chief Complaint:  Chief Complaint  Patient presents with   Chest Pain   Reason for Consult:  Chest pain  HPI: 61 y.o. female with known apparent coronary artery atherosclerosis in the past hypertension hyperlipidemia peripheral vascular disease status post previous stroke with some COPD who has had appropriate medication management for these risk factors.  The patient has come in with some progression of shortness of breath apparent chest pressure with physical activity relieved by rest.  There is currently no evidence of chest pressure or pain at rest.  The patient does have an EKG showing normal sinus rhythm with nonspecific ST and T wave changes and a CT angiogram of her head showing peripheral vascular disease to a mild degree.  Chest x-ray was normal with troponin of 12 and a GFR of 54.  Currently the patient is relatively stable and appears not to need further intervention at this time.  Past Medical History:  Diagnosis Date   Asthma    Bilateral low back pain with bilateral sciatica    Bulging lumbar disc    COPD (chronic obstructive pulmonary disease) (HCC)    Coronary artery disease    Diabetes mellitus without complication (HCC)    Fibromyalgia    GERD (gastroesophageal reflux disease)    Hyperlipidemia    Hypertension    Stroke ([place]) 04/2021      Surgical History:  Past Surgical History:  Procedure Laterality Date   ABDOMINAL HYSTERECTOMY     KNEE ARTHROSCOPY Right    MICROLARYNGOSCOPY Bilateral 10/23/2021   Procedure: MICROLARYNGOSCOPY WITH EXCISION VOCAL CORD POLYPS;  Surgeon: Clyde Canterbury, MD;  Location: [facility] ORS;  Service: ENT;  Laterality: Bilateral;  Diabetic     Home Meds: Prior to Admission medications   Medication  Sig Start Date End Date Taking? Authorizing Provider  amLODipine (NORVASC) 5 MG tablet Take 5 mg by mouth every morning. 05/15/21  Yes [provider]  buPROPion ER (WELLBUTRIN SR) 100 MG 12 hr tablet Take 100 mg by mouth 2 (two) times daily. 04/06/22  Yes [provider]  gabapentin (NEURONTIN) 400 MG capsule Take 800 mg by mouth 3 (three) times daily as needed. 04/30/21  Yes [provider]  levocetirizine (XYZAL) 5 MG tablet Take 5 mg by mouth every evening.   Yes [provider]  liraglutide (VICTOZA) 18 MG/3ML SOPN Inject 1.5 mg into the skin every morning.   Yes [provider]  lisinopril (ZESTRIL) 10 MG tablet Take 10 mg by mouth daily.   Yes [provider]  losartan (COZAAR) 50 MG tablet Take 50 mg by mouth every morning.   Yes [provider]  metFORMIN (GLUCOPHAGE-XR) 500 MG 24 hr tablet Take 1,000 mg by mouth daily. 01/11/21  Yes [provider]  montelukast (SINGULAIR) 10 MG tablet Take 10 mg by mouth at bedtime. 04/26/21  Yes [provider]  NOVOLIN 70/30 (70-30) 100 UNIT/ML injection Inject 44 Units into the skin 2 (two) times daily. 04/29/21  Yes [provider]  omeprazole (PRILOSEC) 40 MG capsule Take 40 mg by mouth at bedtime. 04/14/22  Yes [provider]  traZODone (DESYREL) 100 MG tablet Take 100 mg by mouth at bedtime. 04/24/21  Yes [provider]  triamterene-hydrochlorothiazide (MAXZIDE-25) 37.5-25 MG tablet Take 1 tablet by  mouth every morning. 05/15/21  Yes [provider]  albuterol (VENTOLIN HFA) 108 (90 Base) MCG/ACT inhaler Inhale 2 puffs into the lungs every 4 (four) hours as needed for shortness of breath or wheezing. 04/30/21   [provider]  atorvastatin (LIPITOR) 40 MG tablet Take 1 tablet (40 mg total) by mouth daily. Patient not taking: Reported on 05/02/2022 05/20/21   Sharen Hones, MD  Budesonide-Formoterol Fumarate (SYMBICORT IN) Inhale into the  lungs as needed. Patient not taking: Reported on 05/02/2022    [provider]  canagliflozin (INVOKANA) 100 MG TABS tablet Take by mouth daily before breakfast. Patient not taking: Reported on 05/02/2022    [provider]  citalopram (CELEXA) 20 MG tablet Take 20 mg by mouth every morning. Patient not taking: Reported on 05/02/2022 02/03/21   [provider]  cyclobenzaprine (FLEXERIL) 10 MG tablet Take 10 mg by mouth 3 (three) times daily. Patient not taking: Reported on 05/02/2022 01/11/21   [provider]  fluticasone (FLONASE) 50 MCG/ACT nasal spray Place into both nostrils daily.    [provider]  HYDROcodone-acetaminophen (NORCO/VICODIN) 5-325 MG tablet Take 1-2 tablets by mouth every 6 (six) hours as needed for moderate pain. Patient not taking: Reported on 05/02/2022 10/23/21   Clyde Canterbury, MD  insulin NPH Human (NOVOLIN N) 100 UNIT/ML injection Inject 44 Units into the skin 2 (two) times daily before a meal. Patient not taking: Reported on 10/09/2021    [provider]  metoprolol succinate (TOPROL-XL) 50 MG 24 hr tablet Take 50 mg by mouth every morning. Patient not taking: Reported on 10/23/2021 01/05/21   [provider]  omeprazole (PRILOSEC) 20 MG capsule Take 20 mg by mouth 2 (two) times daily. Patient not taking: Reported on 05/02/2022    [provider]  ondansetron (ZOFRAN-ODT) 4 MG disintegrating tablet Take 1 tablet (4 mg total) by mouth every 8 (eight) hours as needed. 12/13/21   Vladimir Crofts, MD    Inpatient Medications:   [START ON 05/03/2022] aspirin EC  325 mg Oral Daily   enoxaparin (LOVENOX) injection  0.5 mg/kg Subcutaneous Q24H   insulin aspart  0-5 Units Subcutaneous QHS   insulin aspart  0-9 Units Subcutaneous TID WC   nicotine  21 mg Transdermal Daily     Allergies:  Allergies  Allergen Reactions   Ibuprofen Other (See Comments)    N/V/ salivate   Peach [Prunus Persica] Itching and  Swelling    Mouth itches and swells   Penicillins Rash    Social History   Socioeconomic History   Marital status: Married    Spouse name: Not on file   Number of children: Not on file   Years of education: Not on file   Highest education level: Not on file  Occupational History   Not on file  Tobacco Use   Smoking status: Every Day    Packs/day: 0.50    Years: 44.00    Total pack years: 22.00    Types: Cigarettes   Smokeless tobacco: Never   Tobacco comments:    Started age 71  Vaping Use   Vaping Use: Never used  Substance and Sexual Activity   Alcohol use: No   Drug use: Never   Sexual activity: Not on file  Other Topics Concern   Not on file  Social History Narrative   Not on file   Social Determinants of Health   Financial Resource Strain: Not on file  Food Insecurity: Not on file  Transportation Needs: Not on file  Physical Activity: Not on file  Stress: Not on file  Social Connections: Not on file  Intimate Partner Violence: Not on file     No family history on file.   Review of Systems Positive for shortness of breath chest pain Negative for: General:  chills, fever, night sweats or weight changes.  Cardiovascular: PND orthopnea syncope dizziness  Dermatological skin lesions rashes Respiratory: Cough congestion Urologic: Frequent urination urination at night and hematuria Abdominal: negative for nausea, vomiting, diarrhea, bright red blood per rectum, melena, or hematemesis Neurologic: negative for visual changes, and/or hearing changes  All other systems reviewed and are otherwise negative except as noted above.  Labs: No results for input(s): "CKTOTAL", "CKMB", "TROPONINI" in the last 72 hours. Lab Results  Component Value Date   WBC 6.0 05/02/2022   HGB 14.1 05/02/2022   HCT 42.2 05/02/2022   MCV 89.2 05/02/2022   PLT 280 05/02/2022    Recent Labs  Lab 05/02/22 0915  NA 139  K 3.3*  CL 105  CO2 27  BUN 20  CREATININE 1.16*   CALCIUM 9.3  GLUCOSE 255*   Lab Results  Component Value Date   CHOL 114 05/02/2022   HDL 38 (L) 05/02/2022   LDLCALC 45 05/02/2022   TRIG 156 (H) 05/02/2022   No results found for: "DDIMER"  Radiology/Studies:  DG Chest 2 View  Result Date: 05/02/2022 CLINICAL DATA:  Chest pain EXAM: CHEST - 2 VIEW COMPARISON:  12/13/2022 FINDINGS: Cardiomediastinal silhouette and pulmonary vasculature are within normal limits. Lungs are clear. Unchanged minimal anterior wedging of the T11 and T12 vertebral bodies. IMPRESSION: No acute cardiopulmonary process. Electronically Signed   By: Miachel Roux M.D.   On: 05/02/2022 09:27    EKG: Normal sinus rhythm with nonspecific ST and T wave changes  Weights: Filed Weights   05/02/22 0911  Weight: 82.6 kg     Physical Exam: Blood pressure (!) 161/83, pulse 95, temperature 98.1 F (36.7 C), temperature source Oral, resp. rate 20, height '5\' 4"'$  (1.626 m), weight 82.6 kg, SpO2 95 %. Body mass index is 31.24 kg/m. General: Well developed, well nourished, in no acute distress. Head eyes ears nose throat: Normocephalic, atraumatic, sclera non-icteric, no xanthomas, nares are without discharge. No apparent thyromegaly and/or mass  Lungs: Normal respiratory effort.  no wheezes, no rales, no rhonchi.  Heart: RRR with normal S1 S2. no murmur gallop, no rub, PMI is normal size and placement, carotid upstroke normal without bruit, jugular venous pressure is normal Abdomen: Soft, non-tender, non-distended with normoactive bowel sounds. No hepatomegaly. No rebound/guarding. No obvious abdominal masses. Abdominal aorta is normal size without bruit Extremities: No edema. no cyanosis, no clubbing, no ulcers  Peripheral : 2+ bilateral upper extremity pulses, 2+ bilateral femoral pulses, 2+ bilateral dorsal pedal pulse Neuro: Alert and oriented. No facial asymmetry. No focal deficit. Moves all extremities spontaneously. Musculoskeletal: Normal muscle tone without  kyphosis Psych:  Responds to questions appropriately with a normal affect.    Assessment: 61 year old female with hypertension hyperlipidemia diabetes chronic kidney disease stage IIIa with some anginal symptoms although no evidence of acute coronary syndrome congestive heart failure or myocardial infarction at this time.  The patient appears at low risk for cardiovascular event at this time and may be able to be medically managed until further intervention and/or diagnostics can be performed.  Plan: 1.  Continue supportive care for her significant cardiovascular risk factors including diabetes hypertension hyperlipidemia and peripheral vascular  disease 2.  High intensity cholesterol therapy 3.  Add nitrate isosorbide 30 mg each day for further risk reduction of anginal symptoms 4.  Begin ambulation and follow-up for improvements of symptoms okay for discharged home if no significant progression of symptoms at this time 5.  Okay for follow-up in 1 week for further other diagnostic testing if necessary  Signed, Corey Skains M.D. [facility] 05/02/2022, 3:58 PM

## 2022-05-02 NOTE — ED Provider Notes (Addendum)
[facility] Provider Note    Event Date/Time   First MD Initiated Contact with Patient 05/02/22 1117     (approximate)   History   Chest Pain   HPI  Anne Brennan is a 61 y.o. female who on review of discharge summary from August 1 of last year has a history of TIA diabetes COPD previous right-sided ischemic stroke thyroid nodules  For about 2 to 3 weeks now has been noticing when she walks or exerts she will get chest pressure.  He gets very heavy and feels like a heaviness across her chest and that go away after she will sit for a minute or 2.  She is to the point now if she walked 100 feet she feels like she cannot make it because the pressure will become overwhelming and she has to stop.  She went to urgent care today for treatment of right leg sciatica which she has had for some time but reports it seems like is flaring slightly, no significant change other than the increased pain from the right buttock down towards the outer right leg.  However she came to the ER because of the concerns of chest pain and pressure she has been experiencing  Currently pain and symptom-free since she is resting.  She has not taken her aspirin today  She does not think she is ever had any sort of stress test but has seen Dr. Alanda Amass would of cardiology     Physical Exam   Triage Vital Signs: ED Triage Vitals  Enc Vitals Group     BP 05/02/22 0915 (!) 145/75     Pulse Rate 05/02/22 0915 90     Resp 05/02/22 0915 16     Temp 05/02/22 0915 98.1 F (36.7 C)     Temp Source 05/02/22 0915 Oral     SpO2 05/02/22 0915 94 %     Weight 05/02/22 0911 182 lb (82.6 kg)     Height 05/02/22 0911 '5\' 4"'$  (1.626 m)     Head Circumference --      Peak Flow --      Pain Score 05/02/22 0911 0     Pain Loc --      Pain Edu? --      Excl. in [place]? --     Most recent vital signs: Vitals:   05/02/22 1130 05/02/22 1200  BP: 125/62 137/66  Pulse: 85 90  Resp: 14   Temp:     SpO2: 95% 97%     General: Awake, no distress.  CV:  Good peripheral perfusion.  Normal heart tones Resp:  Normal effort.  Clear bilateral Abd:  No distention.  Other:  Good use of the right lower extremity.  Reports pain with bending and standing that radiates from her lower back out, but reports its been pretty stable now for years   ED Results / Procedures / Treatments   Labs (all labs ordered are listed, but only abnormal results are displayed) Labs Reviewed  BASIC METABOLIC PANEL - Abnormal; Notable for the following components:      Result Value   Potassium 3.3 (*)    Glucose, Bld 255 (*)    Creatinine, Ser 1.16 (*)    GFR, Estimated 54 (*)    All other components within normal limits  CBC  URINE DRUG SCREEN, QUALITATIVE ([facility] ONLY)  HEMOGLOBIN A1C  LIPID PANEL  MAGNESIUM  PROTIME-INR  APTT  TROPONIN I (HIGH SENSITIVITY)  TROPONIN  I (HIGH SENSITIVITY)     EKG  Interpreted by me at 915 heart rate 80 QRS 90 QTc 480 normal sinus rhythm, ST segment abnormality including T wave inversions noted in inferior and lateral precordial leads.  Compared with previous EKG from July 31 of last year these changes are new.   RADIOLOGY  Chest x-ray interpreted by me as negative for acute finding     PROCEDURES:  Critical Care performed: No  Procedures   MEDICATIONS ORDERED IN ED: Medications  aspirin EC tablet 325 mg (has no administration in time range)  nitroGLYCERIN (NITROSTAT) SL tablet 0.4 mg (has no administration in time range)  morphine (PF) 2 MG/ML injection 2 mg (has no administration in time range)  albuterol (PROVENTIL) (2.5 MG/3ML) 0.083% nebulizer solution 3 mL (has no administration in time range)  dextromethorphan-guaiFENesin (MUCINEX DM) 30-600 MG per 12 hr tablet 1 tablet (has no administration in time range)  nicotine (NICODERM CQ - dosed in mg/24 hours) patch 21 mg (has no administration in time range)  ondansetron (ZOFRAN) injection 4 mg (has  no administration in time range)  acetaminophen (TYLENOL) tablet 650 mg (has no administration in time range)  hydrALAZINE (APRESOLINE) injection 5 mg (has no administration in time range)  potassium chloride SA (KLOR-CON M) CR tablet 40 mEq (has no administration in time range)  enoxaparin (LOVENOX) injection 42.5 mg (has no administration in time range)  insulin aspart (novoLOG) injection 0-5 Units (has no administration in time range)  insulin aspart (novoLOG) injection 0-9 Units (has no administration in time range)  aspirin chewable tablet 324 mg (324 mg Oral Given 05/02/22 1200)  HYDROcodone-acetaminophen (NORCO/VICODIN) 5-325 MG per tablet 2 tablet (2 tablets Oral Given 05/02/22 1200)     IMPRESSION / MDM / ASSESSMENT AND PLAN / ED COURSE  I reviewed the triage vital signs and the nursing notes.                              Differential diagnosis includes, but is not limited to, angina, unstable angina, NSTEMI, demand ischemia, deconditioning, PE, pneumothorax pneumonia etc.  Patient's presentation is most consistent with acute presentation with potential threat to life or bodily function.  The patient is on the cardiac monitor to evaluate for evidence of arrhythmia and/or significant heart rate changes.    No pleuritic pain no hypoxia no tachycardia.  No leg swelling no clear provocative factors to be a concern for PE.  Her symptoms seem very concerning for development of angina/unstable angina and her EKG today does show T wave abnormality which is new from previous.  Based on her concerns of relatively rapid worsening in ability to exert herself and chest pressure associated and discussed with the patient we will admit for further cardiac observation and consideration for cardiology consult or stress testing.  ----------------------------------------- 12:22 PM on 05/02/2022 ----------------------------------------- Patient agreeable with plan for consultation with the  hospitalist in anticipation of observation and further work-up in the hospital regarding her chest pressure  Labs interpreted as mild reduction in GFR, mild hypokalemia.  Both troponins are normal which is reassuring but given the patient's EKG changes from her baseline with associated episodic chest pressure with exertion I do believe she is at moderate risk for ACS and she will be admitted for observation and further work-up  FINAL CLINICAL IMPRESSION(S) / ED DIAGNOSES   Final diagnoses:  Chest pain with moderate risk for cardiac etiology  Rx / DC Orders   ED Discharge Orders     None        Note:  This document was prepared using Dragon voice recognition software and may include unintentional dictation errors.   Delman Kitten, MD 05/02/22 1223    Delman Kitten, MD 05/02/22 1223  I consulted with Dr. Blaine Hamper, who after discussing case is agreeable with admission to hospitalist service    Delman Kitten, MD 05/02/22 1234

## 2022-05-02 NOTE — ED Triage Notes (Signed)
Pt reports that for the past 2 weeks she has been having intermitted central chest pain with some sob.

## 2022-05-02 NOTE — Assessment & Plan Note (Signed)
-   Continue home medications 

## 2022-05-02 NOTE — ED Notes (Signed)
See triage note.Presents from [facility]  States she went to see MD about her lower back pain and chest   States pain has been intermittent for the past 2 weeks  Denies any fever ,cough or trauma

## 2022-05-02 NOTE — Discharge Instructions (Addendum)
You were cared for by a hospitalist during your hospital stay. If you have any questions about your discharge medications or the care you received while you were in the hospital after you are discharged, you can call the unit and ask to speak with the hospitalist on call if the hospitalist that took care of you is not available. Once you are discharged, your primary care physician will handle any further medical issues. Please note that NO REFILLS for any discharge medications will be authorized once you are discharged, as it is imperative that you return to your primary care physician (or establish a relationship with a primary care physician if you do not have one) for your aftercare needs so that they can reassess your need for medications and monitor your lab values.  Follow up with PCP in 2 weeks and your cardiologist, Dr. Clayborn Bigness in 1 week.  Please stop taking Cozaar since you are already on lisinopril.  These two medication have similar effects for your blood pressure Please start taking Imdur 30 mg daily, aspirin 325 mg daily. I will send the prescription to your pharmacy

## 2022-05-02 NOTE — Assessment & Plan Note (Signed)
Recent A1c 9.2, poorly controlled.  Patient is taking metformin, Victoza, 70/30 insulin 44 units twice daily -Sliding scale insulin -70/30 insulin 33 units in the hospital, will resume home regimen at discharge

## 2022-05-02 NOTE — Assessment & Plan Note (Signed)
Potassium 3.3 -Repleted potassium -Check a magnesium level --> 2.3

## 2022-05-02 NOTE — Assessment & Plan Note (Signed)
Stable.  Recent baseline creatinine 1.21 on 12/13/2021.  Her creatinine is 1.16, BUN 20. -Follow-up with BMP

## 2022-05-02 NOTE — Assessment & Plan Note (Signed)
-   Aspirin and Lipitor

## 2022-05-02 NOTE — Assessment & Plan Note (Signed)
Stable -Bronchodilators 

## 2022-05-02 NOTE — H&P (Signed)
History and Physical    Anne Brennan OIZ:[id number] DOB: [birth year] DOA: 05/02/2022  Referring MD/NP/PA:   PCP: Idelle Crouch, MD   Patient coming from:  The patient is coming from home.  At baseline, pt is independent for most of ADL.        Chief Complaint: chest pain  HPI: Anne Brennan is a 61 y.o. female with medical history significant of HTN, HLD, DM, COPD, TIA, depression, tobacco abuse, fibromyalgia, who presents with chest pain.  Patient states that she has intermittent chest pain for more than 2 weeks. Her chest pain is located in the substernal area, pressure-like, moderate, exertional, aggravated by exertion, associated with mild shortness of breath.  Patient also has cough with clear mucus production.  Denies tenderness in the calf areas.  No nausea, vomiting, diarrhea or abdominal pain.  No symptoms of UTI.  Patient states that she has chronic sciatic pain in the right buttock.   Data reviewed independently and ED Course: pt was found to have ammonia level 13, 12, WBC 6.0, AKI with creatinine 1.16, BUN 20, GFR 54 (recent baseline creatinine 0.95 on 10/16/2021, 1.21 on 12/13/2021), temperature normal, blood pressure 145/75, heart rate 90, RR 16, oxygen saturation 94% on room air.  Chest x-ray negative.  Patient is placed on telemetry bed for observation, Dr. Nehemiah Massed of currently is consulted.   EKG: I have personally reviewed.  Sinus rhythm, QTc 488, LAE, T wave inversion in V4-V6 and a lead III/aVF, questionable ST elevation in V1-V2.   Review of Systems:   Brennan: no fevers, chills, no body weight gain, has fatigue HEENT: no blurry vision, hearing changes or sore throat Respiratory: has dyspnea, coughing, no wheezing CV: has chest pain, no palpitations GI: no nausea, vomiting, abdominal pain, diarrhea, constipation GU: no dysuria, burning on urination, increased urinary frequency, hematuria  Ext: no leg edema Neuro: no unilateral weakness, numbness,  or tingling, no vision change or hearing loss Skin: no rash, no skin tear. MSK: No muscle spasm, no deformity, no limitation of range of movement in spin Heme: No easy bruising.  Travel history: No recent long distant travel.   Allergy:  Allergies  Allergen Reactions   Ibuprofen Other (See Comments)    N/V/ salivate   Peach [Prunus Persica] Itching and Swelling    Mouth itches and swells   Penicillins Rash    Past Medical History:  Diagnosis Date   Asthma    Bilateral low back pain with bilateral sciatica    Bulging lumbar disc    COPD (chronic obstructive pulmonary disease) (HCC)    Coronary artery disease    Diabetes mellitus without complication (HCC)    Fibromyalgia    GERD (gastroesophageal reflux disease)    Hyperlipidemia    Hypertension    Stroke ([place]) 04/2021    Past Surgical History:  Procedure Laterality Date   ABDOMINAL HYSTERECTOMY     KNEE ARTHROSCOPY Right    MICROLARYNGOSCOPY Bilateral 10/23/2021   Procedure: MICROLARYNGOSCOPY WITH EXCISION VOCAL CORD POLYPS;  Surgeon: Clyde Canterbury, MD;  Location: [facility] ORS;  Service: ENT;  Laterality: Bilateral;  Diabetic    Social History:  reports that she has been smoking cigarettes. She has a 22.00 pack-year smoking history. She has never used smokeless tobacco. She reports that she does not drink alcohol and does not use drugs.  Family History:  Family History  Problem Relation Age of Onset   Heart attack Mother    Heart attack Father  Heart failure Sister    Heart attack Brother      Prior to Admission medications   Medication Sig Start Date End Date Taking? Authorizing Provider  albuterol (VENTOLIN HFA) 108 (90 Base) MCG/ACT inhaler Inhale 2 puffs into the lungs every 4 (four) hours as needed for shortness of breath or wheezing. 04/30/21   [provider]  amLODipine (NORVASC) 5 MG tablet Take 5 mg by mouth every morning. 05/15/21   [provider]  atorvastatin (LIPITOR) 40 MG tablet Take  1 tablet (40 mg total) by mouth daily. Patient taking differently: Take 40 mg by mouth every morning. 05/20/21   Sharen Hones, MD  Budesonide-Formoterol Fumarate (SYMBICORT IN) Inhale into the lungs as needed.    [provider]  canagliflozin (INVOKANA) 100 MG TABS tablet Take by mouth daily before breakfast.    [provider]  citalopram (CELEXA) 20 MG tablet Take 20 mg by mouth every morning. 02/03/21   [provider]  cyclobenzaprine (FLEXERIL) 10 MG tablet Take 10 mg by mouth 3 (three) times daily. 01/11/21   [provider]  fluticasone (FLONASE) 50 MCG/ACT nasal spray Place into both nostrils daily.    [provider]  gabapentin (NEURONTIN) 400 MG capsule Take 800 mg by mouth 3 (three) times daily as needed. 04/30/21   [provider]  HYDROcodone-acetaminophen (NORCO/VICODIN) 5-325 MG tablet Take 1-2 tablets by mouth every 6 (six) hours as needed for moderate pain. 10/23/21   Clyde Canterbury, MD  insulin NPH Human (NOVOLIN N) 100 UNIT/ML injection Inject 44 Units into the skin 2 (two) times daily before a meal. Patient not taking: Reported on 10/09/2021    [provider]  levocetirizine (XYZAL) 5 MG tablet Take 5 mg by mouth every evening.    [provider]  liraglutide (VICTOZA) 18 MG/3ML SOPN Inject 1.5 mg into the skin every morning.    [provider]  lisinopril (ZESTRIL) 10 MG tablet Take 10 mg by mouth daily.    [provider]  losartan (COZAAR) 50 MG tablet Take 50 mg by mouth every morning.    [provider]  metFORMIN (GLUCOPHAGE-XR) 500 MG 24 hr tablet Take 1,000 mg by mouth daily. 01/11/21   [provider]  metoprolol succinate (TOPROL-XL) 50 MG 24 hr tablet Take 50 mg by mouth every morning. Patient not taking: Reported on 10/23/2021 01/05/21   [provider]  montelukast (SINGULAIR) 10 MG tablet Take 10 mg by mouth at bedtime. 04/26/21   [provider]   NOVOLIN 70/30 (70-30) 100 UNIT/ML injection Inject 44 Units into the skin 2 (two) times daily. 04/29/21   [provider]  omeprazole (PRILOSEC) 20 MG capsule Take 20 mg by mouth 2 (two) times daily.    [provider]  ondansetron (ZOFRAN-ODT) 4 MG disintegrating tablet Take 1 tablet (4 mg total) by mouth every 8 (eight) hours as needed. 12/13/21   Vladimir Crofts, MD  traZODone (DESYREL) 100 MG tablet Take 100 mg by mouth at bedtime. 04/24/21   [provider]  triamterene-hydrochlorothiazide (MAXZIDE-25) 37.5-25 MG tablet Take 1 tablet by mouth every morning. 05/15/21   [provider]    Physical Exam: Vitals:   05/02/22 0915 05/02/22 1130 05/02/22 1200 05/02/22 1400  BP: (!) 145/75 125/62 137/66 (!) 161/83  Pulse: 90 85 90 95  Resp: '16 14  20  '$ Temp: 98.1 F (36.7 C)     TempSrc: Oral     SpO2: 94% 95% 97% 95%  Weight:      Height:       Brennan: Not in acute distress HEENT:       Eyes: PERRL, EOMI, no scleral icterus.       ENT: No discharge from the ears and nose, no pharynx injection, no tonsillar enlargement.        Neck: No JVD, no bruit, no mass felt. Heme: No neck lymph node enlargement. Cardiac: S1/S2, RRR, No murmurs, No gallops or rubs. Respiratory: No rales, wheezing, rhonchi or rubs. GI: Soft, nondistended, nontender, no rebound pain, no organomegaly, BS present. GU: No hematuria Ext: No pitting leg edema bilaterally. 1+DP/PT pulse bilaterally. Musculoskeletal: No joint deformities, No joint redness or warmth, no limitation of ROM in spin. Skin: No rashes.  Neuro: Alert, oriented X3, cranial nerves II-XII grossly intact, moves all extremities normally.  Psych: Patient is not psychotic, no suicidal or hemocidal ideation.  Labs on Admission: I have personally reviewed following labs and imaging studies  CBC: Recent Labs  Lab 05/02/22 0915  WBC 6.0  HGB 14.1  HCT 42.2  MCV 89.2  PLT 295   Basic Metabolic Panel: Recent Labs   Lab 05/02/22 0915  NA 139  K 3.3*  CL 105  CO2 27  GLUCOSE 255*  BUN 20  CREATININE 1.16*  CALCIUM 9.3  MG 2.3   GFR: Estimated Creatinine Clearance: 53 mL/min (A) (by C-G formula based on SCr of 1.16 mg/dL (H)). Liver Function Tests: No results for input(s): "AST", "ALT", "ALKPHOS", "BILITOT", "PROT", "ALBUMIN" in the last 168 hours. No results for input(s): "LIPASE", "AMYLASE" in the last 168 hours. No results for input(s): "AMMONIA" in the last 168 hours. Coagulation Profile: Recent Labs  Lab 05/02/22 0915  INR 0.9   Cardiac Enzymes: No results for input(s): "CKTOTAL", "CKMB", "CKMBINDEX", "TROPONINI" in the last 168 hours. BNP (last 3 results) No results for input(s): "PROBNP" in the last 8760 hours. HbA1C: No results for input(s): "HGBA1C" in the last 72 hours. CBG: No results for input(s): "GLUCAP" in the last 168 hours. Lipid Profile: Recent Labs    05/02/22 0915  CHOL 114  HDL 38*  LDLCALC 45  TRIG 156*  CHOLHDL 3.0   Thyroid Function Tests: No results for input(s): "TSH", "T4TOTAL", "FREET4", "T3FREE", "THYROIDAB" in the last 72 hours. Anemia Panel: No results for input(s): "VITAMINB12", "FOLATE", "FERRITIN", "TIBC", "IRON", "RETICCTPCT" in the last 72 hours. Urine analysis:    Component Value Date/Time   COLORURINE STRAW (A) 12/13/2021 1823   APPEARANCEUR CLEAR (A) 12/13/2021 1823   LABSPEC 1.006 12/13/2021 1823   PHURINE 6.0 12/13/2021 1823   GLUCOSEU NEGATIVE 12/13/2021 1823   HGBUR MODERATE (A) 12/13/2021 1823   BILIRUBINUR NEGATIVE 12/13/2021 1823   KETONESUR NEGATIVE 12/13/2021 1823   PROTEINUR NEGATIVE 12/13/2021 1823   NITRITE NEGATIVE 12/13/2021 1823   LEUKOCYTESUR NEGATIVE 12/13/2021 1823   Sepsis Labs: '@LABRCNTIP'$ (procalcitonin:4,lacticidven:4) )No results found for this or any previous visit (from the past 240 hour(s)).   Radiological Exams on Admission: DG Chest 2 View  Result Date: 05/02/2022 CLINICAL DATA:  Chest pain EXAM:  CHEST - 2 VIEW COMPARISON:  12/13/2022 FINDINGS: Cardiomediastinal silhouette and pulmonary vasculature are within normal limits. Lungs are clear. Unchanged minimal anterior wedging of the T11 and T12 vertebral bodies. IMPRESSION: No acute cardiopulmonary process. Electronically Signed   By: Miachel Roux M.D.   On: 05/02/2022 09:27      Assessment/Plan Principal Problem:   Chest pain Active Problems:   Essential hypertension   TIA (transient  ischemic attack)   COPD (chronic obstructive pulmonary disease) (HCC)   Diabetes mellitus without complication (HCC)   Tobacco use disorder   Chronic kidney disease, stage 3a (HCC)   Depression   Hypokalemia   Assessment and Plan: * Chest pain Patient has exertional intermittent chest pain for more than 2 weeks.  Troponin negative x2.  Consulted cardiology, Dr. Nehemiah Massed evaluated patient. He added nitrate isosorbide 30 mg each day. Per Dr. Nehemiah Massed, if patient does not have further chest pain on ambulation, patient can be discharged home and follow-up with his cardiologist in 1 week for further other diagnostic testing if necessary. Pt did not have further chest pain on ambulation in ED.   - pt was placed  to tele bed for observation --> will d/c home and follow up with your cardiologist in 1 week - pt was treated with Nitroglycerin, Morphine, and aspirin, lipitor in hospital --> will continue ASA and lipitor - checked FLP and A1C, UDS - started nitrate isosorbide 30 mg       Essential hypertension - IV hydralazine as needed -Continue home amlodipine, lisinopril, metoprolol, Maxizide -will hold off her Cozaar since patient is on lisinopril  TIA (transient ischemic attack) - Aspirin and Lipitor  COPD (chronic obstructive pulmonary disease) ([facility]) Stable - Bronchodilators  Diabetes mellitus without complication (HCC) Recent A1c 9.2, poorly controlled.  Patient is taking metformin, Victoza, 70/30 insulin 44 units twice daily -Sliding  scale insulin -70/30 insulin 33 units in the hospital, will resume home regimen at discharge  Tobacco use disorder - Nicotine patch  Chronic kidney disease, stage 3a (HCC) Stable.  Recent baseline creatinine 1.21 on 12/13/2021.  Her creatinine is 1.16, BUN 20. -Follow-up with BMP  Depression - Continue home medications  Hypokalemia Potassium 3.3 -Repleted potassium -Check a magnesium level --> 2.3          DVT ppx:  SQ Lovenox  Code Status: Full code  Family Communication:  Yes, patient's sister    at bed side.      Disposition Plan:  Anticipate discharge back to previous environment  Consults called:  Dr. Nehemiah Massed of card  Admission status and Level of care: Telemetry Cardiac:    for obs     Severity of Illness:  The appropriate patient status for this patient is OBSERVATION. Observation status is judged to be reasonable and necessary in order to provide the required intensity of service to ensure the patient's safety. The patient's presenting symptoms, physical exam findings, and initial radiographic and laboratory data in the context of their medical condition is felt to place them at decreased risk for further clinical deterioration. Furthermore, it is anticipated that the patient will be medically stable for discharge from the hospital within 2 midnights of admission.        Date of Service 05/02/2022    Ivor Costa [facility]   If 7PM-7AM, please contact night-coverage [email or web address] 05/02/2022, 5:22 PM

## 2022-05-02 NOTE — Assessment & Plan Note (Signed)
-   IV hydralazine as needed -Continue home amlodipine, lisinopril, metoprolol, Maxizide -will hold off her Cozaar since patient is on lisinopril

## 2022-05-02 NOTE — Discharge Summary (Signed)
Physician Discharge Summary  Anne Brennan IFO:[id number] DOB: 02-19-1961 DOA: 05/02/2022  PCP: Idelle Crouch, MD  Admit date: 05/02/2022 Discharge date: 05/02/2022  Recommendations for Outpatient Follow-up:  Follow up with PCP in 2 weeks Please obtain BMP in one week  Home Health: none Equipment/Devices: none   Discharge Condition: stable CODE STATUS: full Diet recommendation: heart and carb modified  Brief/Interim Summary (HPI)  Anne Brennan is a 61 y.o. female with medical history significant of HTN, HLD, DM, COPD, TIA, depression, tobacco abuse, fibromyalgia, who presents with chest pain.   Patient states that she has intermittent chest pain for more than 2 weeks. Her chest pain is located in the substernal area, pressure-like, moderate, exertional, aggravated by exertion, associated with mild shortness of breath.  Patient also has cough with clear mucus production.  Denies tenderness in the calf areas.  No nausea, vomiting, diarrhea or abdominal pain.  No symptoms of UTI.  Patient states that she has chronic sciatic pain in the right buttock.     Data reviewed independently and ED Course: pt was found to have ammonia level 13, 12, WBC 6.0, AKI with creatinine 1.16, BUN 20, GFR 54 (recent baseline creatinine 0.95 on 10/16/2021, 1.21 on 12/13/2021), temperature normal, blood pressure 145/75, heart rate 90, RR 16, oxygen saturation 94% on room air.  Chest x-ray negative.  Patient is placed on telemetry bed for observation, Dr. Nehemiah Massed of currently is consulted.     EKG: I have personally reviewed.  Sinus rhythm, QTc 488, LAE, T wave inversion in V4-V6 and a lead III/aVF, questionable ST elevation in V1-V2.    Subjective  -has chest pain which has resolved  Discharge Diagnoses and Hospital Course:   Principal Problem:   Chest pain Active Problems:   Essential hypertension   TIA (transient ischemic attack)   COPD (chronic obstructive pulmonary disease) (HCC)    Diabetes mellitus without complication (HCC)   Tobacco use disorder   Chronic kidney disease, stage 3a (HCC)   Depression   Hypokalemia    Assessment and Plan: * Chest pain Patient has exertional intermittent chest pain for more than 2 weeks.  Troponin negative x2.  Consulted cardiology, Dr. Nehemiah Massed evaluated patient. He added nitrate isosorbide 30 mg each day. Per Dr. Nehemiah Massed, if patient does not have further chest pain on ambulation, patient can be discharged home and follow-up with his cardiologist in 1 week for further other diagnostic testing if necessary. Pt did not have further chest pain on ambulation in ED.    - pt was placed  to tele bed for observation --> will d/c home and follow up with your cardiologist in 1 week - pt was treated with Nitroglycerin, Morphine, and aspirin, lipitor in hospital --> will continue ASA and lipitor - checked FLP and A1C, UDS - started nitrate isosorbide 30 mg          Essential hypertension - IV hydralazine as needed in hospital -Continue home amlodipine, lisinopril, metoprolol, Maxizide -will hold off her Cozaar since patient is on lisinopril   TIA (transient ischemic attack) - Aspirin and Lipitor   COPD (chronic obstructive pulmonary disease) ([place]) Stable - Bronchodilators   Diabetes mellitus without complication (HCC) Recent A1c 9.2, poorly controlled.  Patient is taking metformin, Victoza, 70/30 insulin 44 units twice daily -Sliding scale insulin -70/30 insulin 33 units in the hospital, will resume home regimen at discharge   Tobacco use disorder - Nicotine patch   Chronic kidney disease, stage 3a (HCC) Stable.  Recent baseline creatinine 1.21 on 12/13/2021.  Her creatinine is 1.16, BUN 20. -Follow-up with BMP   Depression - Continue home medications   Hypokalemia Potassium 3.3 -Repleted potassium -Check a magnesium level --> 2.3    Discharge Instructions   Allergies as of 05/02/2022       Reactions   Ibuprofen  Other (See Comments)   N/V/ salivate   Peach [prunus Persica] Itching, Swelling   Mouth itches and swells   Penicillins Rash        Medication List     STOP taking these medications    HYDROcodone-acetaminophen 5-325 MG tablet Commonly known as: NORCO/VICODIN   insulin NPH Human 100 UNIT/ML injection Commonly known as: NOVOLIN N   losartan 50 MG tablet Commonly known as: COZAAR       TAKE these medications    albuterol 108 (90 Base) MCG/ACT inhaler Commonly known as: VENTOLIN HFA Inhale 2 puffs into the lungs every 4 (four) hours as needed for shortness of breath or wheezing.   amLODipine 5 MG tablet Commonly known as: NORVASC Take 5 mg by mouth every morning.   aspirin EC 325 MG tablet Take 1 tablet (325 mg total) by mouth daily. Start taking on: May 03, 2022   atorvastatin 40 MG tablet Commonly known as: LIPITOR Take 1 tablet (40 mg total) by mouth daily.   buPROPion ER 100 MG 12 hr tablet Commonly known as: WELLBUTRIN SR Take 100 mg by mouth 2 (two) times daily.   canagliflozin 100 MG Tabs tablet Commonly known as: INVOKANA Take by mouth daily before breakfast.   citalopram 20 MG tablet Commonly known as: CELEXA Take 20 mg by mouth every morning.   cyclobenzaprine 10 MG tablet Commonly known as: FLEXERIL Take 10 mg by mouth 3 (three) times daily.   fluticasone 50 MCG/ACT nasal spray Commonly known as: FLONASE Place into both nostrils daily.   gabapentin 400 MG capsule Commonly known as: NEURONTIN Take 800 mg by mouth 3 (three) times daily as needed.   isosorbide mononitrate 30 MG 24 hr tablet Commonly known as: IMDUR Take 1 tablet (30 mg total) by mouth daily.   levocetirizine 5 MG tablet Commonly known as: XYZAL Take 5 mg by mouth every evening.   liraglutide 18 MG/3ML Sopn Commonly known as: VICTOZA Inject 1.5 mg into the skin every morning.   lisinopril 10 MG tablet Commonly known as: ZESTRIL Take 10 mg by mouth daily.    metFORMIN 500 MG 24 hr tablet Commonly known as: GLUCOPHAGE-XR Take 1,000 mg by mouth daily.   metoprolol succinate 50 MG 24 hr tablet Commonly known as: TOPROL-XL Take 50 mg by mouth every morning.   montelukast 10 MG tablet Commonly known as: SINGULAIR Take 10 mg by mouth at bedtime.   nicotine 21 mg/24hr patch Commonly known as: NICODERM CQ - dosed in mg/24 hours Place 1 patch (21 mg total) onto the skin daily. Start taking on: May 03, 2022   nitroGLYCERIN 0.4 MG SL tablet Commonly known as: NITROSTAT Place 1 tablet (0.4 mg total) under the tongue every 5 (five) minutes as needed for chest pain.   NovoLIN 70/30 (70-30) 100 UNIT/ML injection Generic drug: insulin NPH-regular Human Inject 44 Units into the skin 2 (two) times daily.   omeprazole 40 MG capsule Commonly known as: PRILOSEC Take 40 mg by mouth at bedtime. What changed: Another medication with the same name was removed. Continue taking this medication, and follow the directions you see here.   ondansetron 4  MG disintegrating tablet Commonly known as: ZOFRAN-ODT Take 1 tablet (4 mg total) by mouth every 8 (eight) hours as needed.   SYMBICORT IN Inhale into the lungs as needed.   traZODone 100 MG tablet Commonly known as: DESYREL Take 100 mg by mouth at bedtime.   triamterene-hydrochlorothiazide 37.5-25 MG tablet Commonly known as: MAXZIDE-25 Take 1 tablet by mouth every morning.        Follow-up Information     Callwood, Dwayne D, MD Follow up in 1 week(s).   Specialties: Cardiology, Internal Medicine Contact information: [place] [address] [phone]         Idelle Crouch, MD Follow up in 2 week(s).   Specialty: Internal Medicine Contact information: [place] [address] [phone]                Allergies  Allergen Reactions   Ibuprofen Other (See Comments)    N/V/ salivate   Peach [Prunus Persica]  Itching and Swelling    Mouth itches and swells   Penicillins Rash    Consultations: cardiology   Procedures/Studies: DG Chest 2 View  Result Date: 05/02/2022 CLINICAL DATA:  Chest pain EXAM: CHEST - 2 VIEW COMPARISON:  12/13/2022 FINDINGS: Cardiomediastinal silhouette and pulmonary vasculature are within normal limits. Lungs are clear. Unchanged minimal anterior wedging of the T11 and T12 vertebral bodies. IMPRESSION: No acute cardiopulmonary process. Electronically Signed   By: Miachel Roux M.D.   On: 05/02/2022 09:27      Discharge Exam: Vitals:   05/02/22 1200 05/02/22 1400  BP: 137/66 (!) 161/83  Pulse: 90 95  Resp:  20  Temp:    SpO2: 97% 95%   Vitals:   05/02/22 0915 05/02/22 1130 05/02/22 1200 05/02/22 1400  BP: (!) 145/75 125/62 137/66 (!) 161/83  Pulse: 90 85 90 95  Resp: '16 14  20  '$ Temp: 98.1 F (36.7 C)     TempSrc: Oral     SpO2: 94% 95% 97% 95%  Weight:      Height:        General: Not in acute distress HEENT:       Eyes: PERRL, EOMI, no scleral icterus.       ENT: No discharge from the ears and nose, no pharynx injection, no tonsillar enlargement.        Neck: No JVD, no bruit, no mass felt. Heme: No neck lymph node enlargement. Cardiac: S1/S2, RRR, No murmurs, No gallops or rubs. Respiratory: No rales, wheezing, rhonchi or rubs. GI: Soft, nondistended, nontender, no rebound pain, no organomegaly, BS present. GU: No hematuria Ext: No pitting leg edema bilaterally. 1+DP/PT pulse bilaterally. Musculoskeletal: No joint deformities, No joint redness or warmth, no limitation of ROM in spin. Skin: No rashes.  Neuro: Alert, oriented X3, cranial nerves II-XII grossly intact, moves all extremities normally. Psych: Patient is not psychotic, no suicidal or hemocidal ideation.     The results of significant diagnostics from this hospitalization (including imaging, microbiology, ancillary and laboratory) are listed below for reference.      Microbiology: No results found for this or any previous visit (from the past 240 hour(s)).   Labs: BNP (last 3 results) No results for input(s): "BNP" in the last 8760 hours. Basic Metabolic Panel: Recent Labs  Lab 05/02/22 0915  NA 139  K 3.3*  CL 105  CO2 27  GLUCOSE 255*  BUN 20  CREATININE 1.16*  CALCIUM 9.3  MG 2.3  Liver Function Tests: No results for input(s): "AST", "ALT", "ALKPHOS", "BILITOT", "PROT", "ALBUMIN" in the last 168 hours. No results for input(s): "LIPASE", "AMYLASE" in the last 168 hours. No results for input(s): "AMMONIA" in the last 168 hours. CBC: Recent Labs  Lab 05/02/22 0915  WBC 6.0  HGB 14.1  HCT 42.2  MCV 89.2  PLT 280   Cardiac Enzymes: No results for input(s): "CKTOTAL", "CKMB", "CKMBINDEX", "TROPONINI" in the last 168 hours. BNP: Invalid input(s): "POCBNP" CBG: No results for input(s): "GLUCAP" in the last 168 hours. D-Dimer No results for input(s): "DDIMER" in the last 72 hours. Hgb A1c No results for input(s): "HGBA1C" in the last 72 hours. Lipid Profile Recent Labs    05/02/22 0915  CHOL 114  HDL 38*  LDLCALC 45  TRIG 156*  CHOLHDL 3.0   Thyroid function studies No results for input(s): "TSH", "T4TOTAL", "T3FREE", "THYROIDAB" in the last 72 hours.  Invalid input(s): "FREET3" Anemia work up No results for input(s): "VITAMINB12", "FOLATE", "FERRITIN", "TIBC", "IRON", "RETICCTPCT" in the last 72 hours. Urinalysis    Component Value Date/Time   COLORURINE STRAW (A) 12/13/2021 1823   APPEARANCEUR CLEAR (A) 12/13/2021 1823   LABSPEC 1.006 12/13/2021 1823   PHURINE 6.0 12/13/2021 1823   GLUCOSEU NEGATIVE 12/13/2021 1823   HGBUR MODERATE (A) 12/13/2021 1823   BILIRUBINUR NEGATIVE 12/13/2021 1823   KETONESUR NEGATIVE 12/13/2021 1823   PROTEINUR NEGATIVE 12/13/2021 1823   NITRITE NEGATIVE 12/13/2021 1823   LEUKOCYTESUR NEGATIVE 12/13/2021 1823   Sepsis Labs Recent Labs  Lab 05/02/22 0915  WBC 6.0    Microbiology No results found for this or any previous visit (from the past 240 hour(s)).  Time coordinating discharge:  25 minutes.  SIGNED:  Ivor Costa, MD [facility] 05/02/2022, 5:38 PM   If 7PM-7AM, please contact night-coverage [email or web address]

## 2022-05-02 NOTE — Assessment & Plan Note (Signed)
-  Nicotine patch 

## 2022-05-30 ENCOUNTER — Inpatient Hospital Stay
Admission: EM | Admit: 2022-05-30 | Discharge: 2022-06-02 | DRG: 282 | Disposition: A | Discharge status: Other Acute Inpatient Hospital | Payer: Medicare Other | Attending: Internal Medicine | Admitting: Internal Medicine

## 2022-05-30 ENCOUNTER — Encounter: Payer: Self-pay | Admitting: Emergency Medicine

## 2022-05-30 ENCOUNTER — Emergency Department: Payer: Medicare Other

## 2022-05-30 DIAGNOSIS — F17210 Nicotine dependence, cigarettes, uncomplicated: Secondary | ICD-10-CM | POA: Diagnosis present

## 2022-05-30 DIAGNOSIS — E785 Hyperlipidemia, unspecified: Secondary | ICD-10-CM

## 2022-05-30 DIAGNOSIS — E876 Hypokalemia: Secondary | ICD-10-CM | POA: Diagnosis present

## 2022-05-30 DIAGNOSIS — I214 Non-ST elevation (NSTEMI) myocardial infarction: Secondary | ICD-10-CM | POA: Diagnosis not present

## 2022-05-30 DIAGNOSIS — K219 Gastro-esophageal reflux disease without esophagitis: Secondary | ICD-10-CM | POA: Diagnosis present

## 2022-05-30 DIAGNOSIS — E1165 Type 2 diabetes mellitus with hyperglycemia: Secondary | ICD-10-CM

## 2022-05-30 DIAGNOSIS — Z79899 Other long term (current) drug therapy: Secondary | ICD-10-CM

## 2022-05-30 DIAGNOSIS — Z880 Allergy status to penicillin: Secondary | ICD-10-CM

## 2022-05-30 DIAGNOSIS — Z7982 Long term (current) use of aspirin: Secondary | ICD-10-CM

## 2022-05-30 DIAGNOSIS — I10 Essential (primary) hypertension: Secondary | ICD-10-CM | POA: Diagnosis present

## 2022-05-30 DIAGNOSIS — Z8249 Family history of ischemic heart disease and other diseases of the circulatory system: Secondary | ICD-10-CM

## 2022-05-30 DIAGNOSIS — J449 Chronic obstructive pulmonary disease, unspecified: Secondary | ICD-10-CM

## 2022-05-30 DIAGNOSIS — I200 Unstable angina: Secondary | ICD-10-CM | POA: Diagnosis not present

## 2022-05-30 DIAGNOSIS — Z7951 Long term (current) use of inhaled steroids: Secondary | ICD-10-CM

## 2022-05-30 DIAGNOSIS — F32A Depression, unspecified: Secondary | ICD-10-CM | POA: Diagnosis present

## 2022-05-30 DIAGNOSIS — Z886 Allergy status to analgesic agent status: Secondary | ICD-10-CM

## 2022-05-30 DIAGNOSIS — I25110 Atherosclerotic heart disease of native coronary artery with unstable angina pectoris: Secondary | ICD-10-CM | POA: Diagnosis present

## 2022-05-30 DIAGNOSIS — Z91018 Allergy to other foods: Secondary | ICD-10-CM

## 2022-05-30 DIAGNOSIS — Z794 Long term (current) use of insulin: Secondary | ICD-10-CM

## 2022-05-30 DIAGNOSIS — Z8673 Personal history of transient ischemic attack (TIA), and cerebral infarction without residual deficits: Secondary | ICD-10-CM

## 2022-05-30 DIAGNOSIS — Z7984 Long term (current) use of oral hypoglycemic drugs: Secondary | ICD-10-CM

## 2022-05-30 DIAGNOSIS — M797 Fibromyalgia: Secondary | ICD-10-CM | POA: Diagnosis present

## 2022-05-30 LAB — TROPONIN I (HIGH SENSITIVITY): Troponin I (High Sensitivity): 11 ng/L (ref <18)

## 2022-05-30 LAB — D-DIMER, QUANTITATIVE: D-Dimer, Quant: 0.49 ug/mL-FEU (ref 0.00–0.50)

## 2022-05-30 LAB — BASIC METABOLIC PANEL
Anion gap: 12 (ref 5–15)
BUN: 14 mg/dL (ref 8–23)
CO2: 23 mmol/L (ref 22–32)
Calcium: 8.5 mg/dL — ABNORMAL LOW (ref 8.9–10.3)
Chloride: 104 mmol/L (ref 98–111)
Creatinine, Ser: 1.13 mg/dL — ABNORMAL HIGH (ref 0.44–1.00)
GFR, Estimated: 55 mL/min — ABNORMAL LOW (ref >60)
Glucose, Bld: 431 mg/dL — ABNORMAL HIGH (ref 70–99)
Potassium: 3 mmol/L — ABNORMAL LOW (ref 3.5–5.1)
Sodium: 139 mmol/L (ref 135–145)

## 2022-05-30 LAB — PROTIME-INR
INR: 1 (ref 0.8–1.2)
Prothrombin Time: 12.7 seconds (ref 11.4–15.2)

## 2022-05-30 LAB — CBC
HCT: 40.1 % (ref 36.0–46.0)
Hemoglobin: 13.2 g/dL (ref 12.0–15.0)
MCH: 30.1 pg (ref 26.0–34.0)
MCHC: 32.9 g/dL (ref 30.0–36.0)
MCV: 91.6 fL (ref 80.0–100.0)
Platelets: 279 10*3/uL (ref 150–400)
RBC: 4.38 MIL/uL (ref 3.87–5.11)
RDW: 13.2 % (ref 11.5–15.5)
WBC: 6.6 10*3/uL (ref 4.0–10.5)
nRBC: 0 % (ref 0.0–0.2)

## 2022-05-30 LAB — APTT: aPTT: 32 seconds (ref 24–36)

## 2022-05-30 MED ORDER — ENOXAPARIN SODIUM 40 MG/0.4ML IJ SOSY: 40.0000 mg | PREFILLED_SYRINGE | INTRAMUSCULAR | Status: DC

## 2022-05-30 MED ORDER — ALBUTEROL SULFATE (2.5 MG/3ML) 0.083% IN NEBU: 2.5000 mg | INHALATION_SOLUTION | RESPIRATORY_TRACT | Status: DC | PRN

## 2022-05-30 MED ORDER — TRIAMTERENE-HCTZ 37.5-25 MG PO TABS
1.0000 | ORAL_TABLET | Freq: Every day | ORAL | Status: DC
  Administered 2022-05-31 – 2022-06-01 (×2): 1 via ORAL
  Filled 2022-05-30 (×3): qty 1

## 2022-05-30 MED ORDER — LISINOPRIL 10 MG PO TABS
10.0000 mg | ORAL_TABLET | Freq: Every day | ORAL | Status: DC
Start: 2022-05-31 — End: 2022-05-31

## 2022-05-30 MED ORDER — MORPHINE SULFATE (PF) 2 MG/ML IV SOLN
2.0000 mg | INTRAVENOUS | Status: DC | PRN
  Filled 2022-05-30: qty 1

## 2022-05-30 MED ORDER — ASPIRIN 325 MG PO TBEC
325.0000 mg | DELAYED_RELEASE_TABLET | Freq: Every day | ORAL | Status: DC
  Administered 2022-05-31 – 2022-06-01 (×2): 325 mg via ORAL
  Filled 2022-05-30 (×3): qty 1

## 2022-05-30 MED ORDER — NITROGLYCERIN 0.4 MG SL SUBL: 0.4000 mg | SUBLINGUAL_TABLET | SUBLINGUAL | Status: DC | PRN

## 2022-05-30 MED ORDER — ONDANSETRON HCL 4 MG/2ML IJ SOLN
4.0000 mg | Freq: Once | INTRAMUSCULAR | Status: AC
  Administered 2022-05-30: 4 mg via INTRAVENOUS
  Filled 2022-05-30: qty 2

## 2022-05-30 MED ORDER — TRAZODONE HCL 50 MG PO TABS: 25.0000 mg | ORAL_TABLET | Freq: Every evening | ORAL | Status: DC | PRN

## 2022-05-30 MED ORDER — INSULIN ASPART PROT & ASPART (70-30 MIX) 100 UNIT/ML ~~LOC~~ SUSP: 44.0000 [IU] | Freq: Two times a day (BID) | SUBCUTANEOUS | Status: DC

## 2022-05-30 MED ORDER — MONTELUKAST SODIUM 10 MG PO TABS
10.0000 mg | ORAL_TABLET | Freq: Every day | ORAL | Status: DC
  Administered 2022-05-31 – 2022-06-01 (×3): 10 mg via ORAL
  Filled 2022-05-30 (×2): qty 1

## 2022-05-30 MED ORDER — HEPARIN BOLUS VIA INFUSION
4000.0000 [IU] | Freq: Once | INTRAVENOUS | Status: AC
  Administered 2022-05-30: 4000 [IU] via INTRAVENOUS
  Filled 2022-05-30: qty 4000

## 2022-05-30 MED ORDER — MORPHINE SULFATE (PF) 4 MG/ML IV SOLN
4.0000 mg | INTRAVENOUS | Status: DC | PRN
  Administered 2022-05-30: 4 mg via INTRAVENOUS
  Filled 2022-05-30: qty 1

## 2022-05-30 MED ORDER — ACETAMINOPHEN 650 MG RE SUPP: 650.0000 mg | Freq: Four times a day (QID) | RECTAL | Status: DC | PRN

## 2022-05-30 MED ORDER — POTASSIUM CHLORIDE 20 MEQ PO PACK
40.0000 meq | PACK | Freq: Once | ORAL | Status: AC
  Administered 2022-05-30: 40 meq via ORAL

## 2022-05-30 MED ORDER — ATORVASTATIN CALCIUM 20 MG PO TABS: 40.0000 mg | ORAL_TABLET | Freq: Every day | ORAL | Status: DC

## 2022-05-30 MED ORDER — GABAPENTIN 400 MG PO CAPS: 800.0000 mg | ORAL_CAPSULE | Freq: Three times a day (TID) | ORAL | Status: DC | PRN

## 2022-05-30 MED ORDER — INSULIN ASPART PROT & ASPART (70-30 MIX) 100 UNIT/ML ~~LOC~~ SUSP: 22.0000 [IU] | Freq: Two times a day (BID) | SUBCUTANEOUS | Status: DC

## 2022-05-30 MED ORDER — TRAZODONE HCL 100 MG PO TABS
100.0000 mg | ORAL_TABLET | Freq: Every day | ORAL | Status: DC
  Administered 2022-05-30 – 2022-06-01 (×3): 100 mg via ORAL
  Filled 2022-05-30 (×3): qty 1

## 2022-05-30 MED ORDER — HEPARIN (PORCINE) 25000 UT/250ML-% IV SOLN
1100.0000 [IU]/h | INTRAVENOUS | Status: DC
  Administered 2022-05-30 – 2022-05-31 (×2): 950 [IU]/h via INTRAVENOUS
  Administered 2022-06-01: 1100 [IU]/h via INTRAVENOUS
  Filled 2022-05-30 (×3): qty 250

## 2022-05-30 MED ORDER — NITROGLYCERIN 0.4 MG SL SUBL
0.4000 mg | SUBLINGUAL_TABLET | SUBLINGUAL | Status: DC | PRN
  Filled 2022-05-30: qty 1

## 2022-05-30 MED ORDER — ONDANSETRON HCL 4 MG PO TABS: 4.0000 mg | ORAL_TABLET | Freq: Four times a day (QID) | ORAL | Status: DC | PRN

## 2022-05-30 MED ORDER — PANTOPRAZOLE SODIUM 40 MG PO TBEC
40.0000 mg | DELAYED_RELEASE_TABLET | Freq: Every day | ORAL | Status: DC
  Administered 2022-05-31 – 2022-06-02 (×3): 40 mg via ORAL
  Filled 2022-05-30 (×3): qty 1

## 2022-05-30 MED ORDER — POTASSIUM CHLORIDE IN NACL 20-0.9 MEQ/L-% IV SOLN
INTRAVENOUS | Status: DC
  Filled 2022-05-30: qty 1000

## 2022-05-30 MED ORDER — ACETAMINOPHEN 325 MG PO TABS: 650.0000 mg | ORAL_TABLET | Freq: Four times a day (QID) | ORAL | Status: DC | PRN

## 2022-05-30 MED ORDER — FLUTICASONE PROPIONATE 50 MCG/ACT NA SUSP: 2.0000 | Freq: Every day | NASAL | Status: DC | PRN

## 2022-05-30 MED ORDER — AMLODIPINE BESYLATE 5 MG PO TABS
5.0000 mg | ORAL_TABLET | Freq: Every day | ORAL | Status: DC
  Administered 2022-05-31 – 2022-06-01 (×2): 5 mg via ORAL
  Filled 2022-05-30 (×2): qty 1

## 2022-05-30 MED ORDER — ONDANSETRON HCL 4 MG/2ML IJ SOLN: 4.0000 mg | Freq: Four times a day (QID) | INTRAMUSCULAR | Status: DC | PRN

## 2022-05-30 MED ORDER — ALBUTEROL SULFATE HFA 108 (90 BASE) MCG/ACT IN AERS: 2.0000 | INHALATION_SPRAY | RESPIRATORY_TRACT | Status: DC | PRN

## 2022-05-30 MED ORDER — BUPROPION HCL ER (SR) 100 MG PO TB12
100.0000 mg | ORAL_TABLET | Freq: Two times a day (BID) | ORAL | Status: DC
  Administered 2022-05-31 – 2022-06-01 (×4): 100 mg via ORAL
  Filled 2022-05-30 (×7): qty 1

## 2022-05-30 MED ORDER — NITROGLYCERIN 2 % TD OINT
1.0000 [in_us] | TOPICAL_OINTMENT | Freq: Once | TRANSDERMAL | Status: AC
  Administered 2022-05-30: 1 [in_us] via TOPICAL
  Filled 2022-05-30: qty 1

## 2022-05-30 MED ORDER — ISOSORBIDE MONONITRATE ER 30 MG PO TB24
30.0000 mg | ORAL_TABLET | Freq: Every day | ORAL | Status: DC
  Administered 2022-05-31 – 2022-06-02 (×3): 30 mg via ORAL
  Filled 2022-05-30 (×3): qty 1

## 2022-05-30 MED ORDER — MAGNESIUM HYDROXIDE 400 MG/5ML PO SUSP: 30.0000 mL | Freq: Every day | ORAL | Status: DC | PRN

## 2022-05-30 MED ORDER — LORATADINE 10 MG PO TABS
10.0000 mg | ORAL_TABLET | Freq: Every evening | ORAL | Status: DC
  Administered 2022-05-31 – 2022-06-01 (×2): 10 mg via ORAL
  Filled 2022-05-30 (×2): qty 1

## 2022-05-30 NOTE — ED Triage Notes (Signed)
Pt presents via EMS with complaints of right sided CP that started last night, resolved, and returned around 1715 this evening with radiation down to her right arm. Pt took 1 nitro & 324 ASA PTA with no improvement. EMS gave the patient 2 nitro sprays and [place]    VS with EMS BP-133/86 HR-104 100% on RA CBG-324

## 2022-05-30 NOTE — Assessment & Plan Note (Signed)
-   We will continue statin with high-dose.

## 2022-05-30 NOTE — H&P (Incomplete)
[place]   PATIENT NAME: Anne Brennan    MR#:  [id number]  DATE OF BIRTH:  02/09/1961  DATE OF ADMISSION:  05/30/2022  PRIMARY CARE PHYSICIAN: Anne Crouch, MD   Patient is coming from: Home  REQUESTING/REFERRING PHYSICIAN: Merlyn Lot, MD  CHIEF COMPLAINT:   Chief Complaint  Patient presents with   Chest Pain    HISTORY OF PRESENT ILLNESS:  Anne Brennan is a 61 y.o. female with medical history significant for asthma, COPD, coronary artery disease, type 2 diabetes mellitus, fibromyalgia, GERD, hypertension, dyslipidemia and CVA, presented to the emergency room with acute onset of intermittent substernal chest pain felt like pressure and worsening with exertion with associated dyspnea for the last couple weeks.  She denied any associated nausea or vomiting or diaphoresis.  No leg pain or edema or recent travels or surgeries.  She has been having cough with clear sputum production without wheezing.  No dysuria, oliguria or hematuria or flank pain.  No bleeding diathesis.  ED Course: Upon presentation to the emergency room, BP was 157/83 with heart rate of 106 with otherwise normal vital signs.  Labs revealed hypokalemia of 3 and hyperglycemia of 431 with a BUN of 14 and creatinine of 1.13.  CBC was within normal.  Repeat troponin came back elevated at over 300 with initial report. EKG as reviewed by me : EKG showed sinus tachycardia with a rate of 107 and Q waves in V1 with prolonged QT interval (QTc 502 MS). Imaging: Two-view chest x-ray showed no acute cardiopulmonary disease.  The patient was given 1 inch Nitropaste as well as 1 sublingual nitroglycerin, 4 mg of IV morphine sulfate and 4 mg IV Zofran.  She will be admitted to an observation cardiac telemetry bed for further evaluation and management.  PAST MEDICAL HISTORY:   Past Medical History:  Diagnosis Date   Asthma    Bilateral low back pain with bilateral sciatica    Bulging lumbar disc     COPD (chronic obstructive pulmonary disease) (HCC)    Coronary artery disease    Diabetes mellitus without complication (HCC)    Fibromyalgia    GERD (gastroesophageal reflux disease)    Hyperlipidemia    Hypertension    Stroke ([place]) 04/2021    PAST SURGICAL HISTORY:   Past Surgical History:  Procedure Laterality Date   ABDOMINAL HYSTERECTOMY     KNEE ARTHROSCOPY Right    MICROLARYNGOSCOPY Bilateral 10/23/2021   Procedure: MICROLARYNGOSCOPY WITH EXCISION VOCAL CORD POLYPS;  Surgeon: Anne Canterbury, MD;  Location: [facility] ORS;  Service: ENT;  Laterality: Bilateral;  Diabetic    SOCIAL HISTORY:   Social History   Tobacco Use   Smoking status: Every Day    Packs/day: 0.50    Years: 44.00    Total pack years: 22.00    Types: Cigarettes   Smokeless tobacco: Never   Tobacco comments:    Started age 7  Substance Use Topics   Alcohol use: No    FAMILY HISTORY:   Family History  Problem Relation Age of Onset   Heart attack Anne Brennan    Heart attack Anne Brennan    Heart failure Anne Brennan    Heart attack Anne Brennan     DRUG ALLERGIES:   Allergies  Allergen Reactions   Ibuprofen Other (See Comments)    N/V/ salivate   Peach [Prunus Persica] Itching and Swelling    Mouth itches and swells   Penicillins Rash    REVIEW OF SYSTEMS:  ROS As per history of present illness. All pertinent systems were reviewed above. Constitutional, HEENT, cardiovascular, respiratory, GI, GU, musculoskeletal, neuro, psychiatric, endocrine, integumentary and hematologic systems were reviewed and are otherwise negative/unremarkable except for positive findings mentioned above in the HPI.   MEDICATIONS AT HOME:   Prior to Admission medications   Medication Sig Start Date End Date Taking? Authorizing Provider  liraglutide (VICTOZA) 18 MG/3ML SOPN Inject into the skin. 05/16/22  Yes [provider]  albuterol (VENTOLIN HFA) 108 (90 Base) MCG/ACT inhaler Inhale 2 puffs into the lungs every 4  (four) hours as needed for shortness of breath or wheezing. 04/30/21   [provider]  amLODipine (NORVASC) 5 MG tablet Take 5 mg by mouth every morning. 05/15/21   [provider]  aspirin EC 325 MG tablet Take 1 tablet (325 mg total) by mouth daily. 05/03/22   Anne Costa, MD  atorvastatin (LIPITOR) 40 MG tablet Take 1 tablet (40 mg total) by mouth daily. Patient not taking: Reported on 05/02/2022 05/20/21   Anne Hones, MD  Azelastine HCl 137 MCG/SPRAY SOLN SMARTSIG:1-2 Spray(s) Both Nares Twice Daily 04/19/22   [provider]  benzonatate (TESSALON) 200 MG capsule Take 200 mg by mouth 3 (three) times daily as needed. 05/21/22   [provider]  Budesonide-Formoterol Fumarate (SYMBICORT IN) Inhale into the lungs as needed. Patient not taking: Reported on 05/02/2022    [provider]  buPROPion ER [facility] SR) 100 MG 12 hr tablet Take 100 mg by mouth 2 (two) times daily. 04/06/22   [provider]  canagliflozin (INVOKANA) 100 MG TABS tablet Take by mouth daily before breakfast. Patient not taking: Reported on 05/02/2022    [provider]  citalopram (CELEXA) 20 MG tablet Take 20 mg by mouth every morning. Patient not taking: Reported on 05/02/2022 02/03/21   [provider]  cyclobenzaprine (FLEXERIL) 10 MG tablet Take 10 mg by mouth 3 (three) times daily. Patient not taking: Reported on 05/02/2022 01/11/21   [provider]  fluticasone (FLONASE) 50 MCG/ACT nasal spray Place into both nostrils daily.    [provider]  gabapentin (NEURONTIN) 400 MG capsule Take 800 mg by mouth 3 (three) times daily as needed. 04/30/21   [provider]  HUMULIN N KWIKPEN 100 UNIT/ML KwikPen Inject 40 Units into the skin in the morning and at bedtime. 05/29/22   [provider]  HYDROcodone-acetaminophen (NORCO) 10-325 MG tablet Take 1 tablet by mouth 4 (four) times daily as needed. 05/30/22   [provider]  isosorbide mononitrate (IMDUR) 30 MG 24 hr tablet Take 1 tablet (30 mg total) by mouth daily. 05/02/22   Anne Costa, MD  levocetirizine (XYZAL) 5 MG tablet Take 5 mg by mouth every evening.    [provider]  liraglutide (VICTOZA) 18 MG/3ML SOPN Inject 1.5 mg into the skin every morning.    [provider]  lisinopril (ZESTRIL) 10 MG tablet Take 10 mg by mouth daily.    [provider]  metFORMIN (GLUCOPHAGE-XR) 500 MG 24 hr tablet Take 1,000 mg by mouth daily. 01/11/21   [provider]  metoprolol succinate (TOPROL-XL) 50 MG 24 hr tablet Take 50 mg by mouth every morning. Patient not taking: Reported on 10/23/2021 01/05/21   [provider]  montelukast (SINGULAIR) 10 MG tablet Take 10 mg by mouth at bedtime. 04/26/21   [provider]  nicotine (NICODERM CQ - DOSED IN MG/24 HOURS) 21 mg/24hr patch Place 1 patch (  21 mg total) onto the skin daily. 05/03/22   Anne Costa, MD  nitroGLYCERIN (NITROSTAT) 0.4 MG SL tablet Place 1 tablet (0.4 mg total) under the tongue every 5 (five) minutes as needed for chest pain. 05/02/22   Anne Costa, MD  NOVOLIN 70/30 (70-30) 100 UNIT/ML injection Inject 44 Units into the skin 2 (two) times daily. 04/29/21   [provider]  omeprazole (PRILOSEC) 20 MG capsule Take 20 mg by mouth 2 (two) times daily. 05/08/22   [provider]  omeprazole (PRILOSEC) 40 MG capsule Take 40 mg by mouth at bedtime. 04/14/22   [provider]  ondansetron (ZOFRAN-ODT) 4 MG disintegrating tablet Take 1 tablet (4 mg total) by mouth every 8 (eight) hours as needed. 12/13/21   Vladimir Crofts, MD  predniSONE (DELTASONE) 10 MG tablet Take by mouth. 05/30/22   [provider]  rosuvastatin (CRESTOR) 40 MG tablet Take 40 mg by mouth daily. 04/19/22   [provider]  traZODone (DESYREL) 100 MG tablet Take 100 mg by mouth at bedtime. 04/24/21   [provider]  triamterene-hydrochlorothiazide  (MAXZIDE-25) 37.5-25 MG tablet Take 1 tablet by mouth every morning. 05/15/21   [provider]      VITAL SIGNS:  Blood pressure 137/79, pulse (!) 101, temperature 98.4 F (36.9 C), temperature source Oral, resp. rate 17, height '5\' 4"'$  (1.626 m), weight 81.6 kg, SpO2 96 %.  PHYSICAL EXAMINATION:  Physical Exam  GENERAL:  61 y.o.-year-old patient lying in the bed with no acute distress.  EYES: Pupils equal, round, reactive to light and accommodation. No scleral icterus. Extraocular muscles intact.  HEENT: Head atraumatic, normocephalic. Oropharynx and nasopharynx clear.  NECK:  Supple, no jugular venous distention. No thyroid enlargement, no tenderness.  LUNGS: Normal breath sounds bilaterally, no wheezing, rales,rhonchi or crepitation. No use of accessory muscles of respiration.  CARDIOVASCULAR: Regular rate and rhythm, S1, S2 normal. No murmurs, rubs, or gallops.  ABDOMEN: Soft, nondistended, nontender. Bowel sounds present. No organomegaly or mass.  EXTREMITIES: No pedal edema, cyanosis, or clubbing.  NEUROLOGIC: Cranial nerves II through XII are intact. Muscle strength 5/5 in all extremities. Sensation intact. Gait not checked.  PSYCHIATRIC: The patient is alert and oriented x 3.  Normal affect and good eye contact. SKIN: No obvious rash, lesion, or ulcer.   LABORATORY PANEL:   CBC Recent Labs  Lab 05/30/22 1944  WBC 6.6  HGB 13.2  HCT 40.1  PLT 279   ------------------------------------------------------------------------------------------------------------------  Chemistries  Recent Labs  Lab 05/30/22 1944  NA 139  K 3.0*  CL 104  CO2 23  GLUCOSE 431*  BUN 14  CREATININE 1.13*  CALCIUM 8.5*   ------------------------------------------------------------------------------------------------------------------  Cardiac Enzymes No results for input(s): "TROPONINI" in the last 168  hours. ------------------------------------------------------------------------------------------------------------------  RADIOLOGY:  DG Chest 2 View  Result Date: 05/30/2022 CLINICAL DATA:  Right-sided chest pain beginning last night. EXAM: CHEST - 2 VIEW COMPARISON:  05/02/2022 FINDINGS: The heart size and mediastinal contours are within normal limits. Both lungs are clear. The visualized skeletal structures are unremarkable. IMPRESSION: No active cardiopulmonary disease. Electronically Signed   By: Marlaine Hind M.D.   On: 05/30/2022 20:13      IMPRESSION AND PLAN:  Assessment and Plan: * NSTEMI (non-ST elevated myocardial infarction) [facility]) - The patient be admitted to a cardiac telemetry observation bed. - We will start her on IV heparin drip. - She will be placed on high-dose statin therapy. - We will place on as needed sublingual nitroglycerin  and IV morphine sulfate for pain. - We will continue her aspirin, beta-blocker therapy as well as ACE inhibitor/ARB therapy.. - We will hold off long-acting beta agonist. - 2D echo will be obtained. - Cardiology consult will be obtained. - I notified Dr. Nehemiah Massed about the patient.  Uncontrolled type 2 diabetes mellitus with hyperglycemia, with long-term current use of insulin ([place]) - The patient will be placed on high resistant supplemental coverage with NovoLog. - We will continue basal coverage. - We will hold off her metformin and continue Invokana.  Essential hypertension - We will continue her antihypertensives.  Chronic obstructive pulmonary disease (COPD) (HCC) - We will continue her inhalers while holding off long-acting beta agonist. - Given history of asthma we will continue her Singulair.  Depression - We will continue her Celexa and Wellbutrin XL.  Dyslipidemia - We will continue statin with high-dose.   DVT prophylaxis: IV heparin. Advanced Care Planning:  Code Status: full code.  Schedule Family Communication:   The plan of care was discussed in details with the patient (and family). I answered all questions. The patient agreed to proceed with the above mentioned plan. Further management will depend upon hospital course. Disposition Plan: Back to previous home environment Consults called: Cardiology.   All the records are reviewed and case discussed with ED provider.  Status is: Observation   I certify that at the time of admission, it is my clinical judgment that the patient will require hospital care extending less than 2 midnights.                            Dispo: The patient is from: Home              Anticipated d/c is to: Home              Patient currently is not medically stable to d/c.              Difficult to place patient: No  Christel Mormon M.D on 05/30/2022 at 10:53 PM  [facility]   From 7 PM-7 AM, contact night-coverage [email or web address]  CC: Primary care physician; Anne Crouch, MD

## 2022-05-30 NOTE — Progress Notes (Signed)
[place] for heparin infusion Indication: ACS/STEMI  Allergies  Allergen Reactions   Ibuprofen Other (See Comments)    N/V/ salivate   Peach [Prunus Persica] Itching and Swelling    Mouth itches and swells   Penicillins Rash    Patient Measurements: Height: '5\' 4"'$  (162.6 cm) Weight: 81.6 kg (180 lb) IBW/kg (Calculated) : 54.7 Heparin Dosing Weight: 72.4 kg  Vital Signs: Temp: 98.4 F (36.9 C) (08/11 1947) Temp Source: Oral (08/11 1947) BP: 137/79 (08/11 2230) Pulse Rate: 101 (08/11 2230)  Labs: Recent Labs    05/30/22 1944  HGB 13.2  HCT 40.1  PLT 279  CREATININE 1.13*  TROPONINIHS 11    Estimated Creatinine Clearance: 54.1 mL/min (A) (by C-G formula based on SCr of 1.13 mg/dL (H)).   Medical History: Past Medical History:  Diagnosis Date   Asthma    Bilateral low back pain with bilateral sciatica    Bulging lumbar disc    COPD (chronic obstructive pulmonary disease) (HCC)    Coronary artery disease    Diabetes mellitus without complication (HCC)    Fibromyalgia    GERD (gastroesophageal reflux disease)    Hyperlipidemia    Hypertension    Stroke [facility]) 04/2021    Assessment: Pt is a 61 yo female presenting to ED c/o chest pain, found with nSTEMI.  Goal of Therapy:  Heparin level 0.3-0.7 units/ml Monitor platelets by anticoagulation protocol: Yes   Plan:  Bolus 4000 units x 1 Start heparin infusion at 950 units/hr Will check HL in 6 hr after start of infusion CBC daily while on heparin  Renda Rolls, PharmD, [facility] 05/30/2022 11:05 PM

## 2022-05-30 NOTE — ED Notes (Signed)
MD Mansy notified of troponin over 300 per lab

## 2022-05-30 NOTE — Assessment & Plan Note (Signed)
-   We will continue her inhalers while holding off long-acting beta agonist. - Given history of asthma we will continue her Singulair.

## 2022-05-30 NOTE — ED Provider Notes (Signed)
[facility] Provider Note    Event Date/Time   First MD Initiated Contact with Patient 05/30/22 1938     (approximate)   History   Chest Pain   HPI  Natelie RHYLYNN PERDOMO is a 61 y.o. female presents to the ER for evaluation of right-sided chest pain and pressure that started around 515.  Describes the pain radiating up in the right side of her neck.  No diaphoresis.  No pain with inspiration.  No abdominal pain.  No nausea or vomiting.  Is never had pain like this before.  Reportedly is scheduled for outpatient stress test.  She denies any fevers or chills.  Denies any pain ripping or tearing through to her back.     Physical Exam   Triage Vital Signs: ED Triage Vitals  Enc Vitals Group     BP 05/30/22 1947 (!) 157/83     Pulse Rate 05/30/22 1947 (!) 106     Resp 05/30/22 1947 18     Temp 05/30/22 1947 98.4 F (36.9 C)     Temp Source 05/30/22 1947 Oral     SpO2 05/30/22 1947 99 %     Weight 05/30/22 1940 180 lb (81.6 kg)     Height 05/30/22 1940 '5\' 4"'$  (1.626 m)     Head Circumference --      Peak Flow --      Pain Score 05/30/22 1940 10     Pain Loc --      Pain Edu? --      Excl. in [place]? --     Most recent vital signs: Vitals:   05/30/22 2100 05/30/22 2130  BP: (!) 157/77 (!) 155/72  Pulse: 95 97  Resp: 18 17  Temp:    SpO2: 99% 97%     Constitutional: Alert  Eyes: Conjunctivae are normal.  Head: Atraumatic. Nose: No congestion/rhinnorhea. Mouth/Throat: Mucous membranes are moist.   Neck: Painless ROM.  Cardiovascular:   Good peripheral circulation. Respiratory: Normal respiratory effort.  No retractions.  Gastrointestinal: Soft and nontender.  Musculoskeletal:  no deformity Neurologic:  MAE spontaneously. No gross focal neurologic deficits are appreciated.  Skin:  Skin is warm, dry and intact. No rash noted. Psychiatric: Mood and affect are normal. Speech and behavior are normal.    ED Results / Procedures / Treatments    Labs (all labs ordered are listed, but only abnormal results are displayed) Labs Reviewed  BASIC METABOLIC PANEL - Abnormal; Notable for the following components:      Result Value   Potassium 3.0 (*)    Glucose, Bld 431 (*)    Creatinine, Ser 1.13 (*)    Calcium 8.5 (*)    GFR, Estimated 55 (*)    All other components within normal limits  CBC  D-DIMER, QUANTITATIVE  TROPONIN I (HIGH SENSITIVITY)  TROPONIN I (HIGH SENSITIVITY)     EKG  ED ECG REPORT I, Merlyn Lot, the attending physician, personally viewed and interpreted this ECG.   Date: 05/30/2022  EKG Time: 19:47  Rate: 110  Rhythm: sinus  Axis: normal  Intervals: prologed qt  ST&T Change: inferolateral st depressions, no stemi    RADIOLOGY Please see ED Course for my review and interpretation.  I personally reviewed all radiographic images ordered to evaluate for the above acute complaints and reviewed radiology reports and findings.  These findings were personally discussed with the patient.  Please see medical record for radiology report.    PROCEDURES:  Critical  Care performed:   Procedures   MEDICATIONS ORDERED IN ED: Medications  morphine (PF) 4 MG/ML injection 4 mg (4 mg Intravenous Given 05/30/22 2039)  nitroGLYCERIN (NITROSTAT) SL tablet 0.4 mg (has no administration in time range)  ondansetron (ZOFRAN) injection 4 mg (4 mg Intravenous Given 05/30/22 2039)  nitroGLYCERIN (NITROGLYN) 2 % ointment 1 inch (1 inch Topical Given 05/30/22 2140)     IMPRESSION / MDM / ASSESSMENT AND PLAN / ED COURSE  I reviewed the triage vital signs and the nursing notes.                              Differential diagnosis includes, but is not limited to, ACS, pericarditis, esophagitis, boerhaaves, pe, dissection, pna, bronchitis, costochondritis  Patient presented to the ER for evaluation of chest pain as described above.  This presenting complaint could reflect a potentially life-threatening illness  therefore the patient will be placed on continuous pulse oximetry and telemetry for monitoring.  Laboratory evaluation will be sent to evaluate for the above complaints.   Her EKG is concerning does not meet STEMI criteria.  Blood will be sent for the but differential.  Will be given morphine and nitro for pain.  Have lower suspicion for dissection or PE will order D-dimer.   Clinical Course as of 05/30/22 2154  Fri May 30, 2022  2018 X-ray on my review and interpretation does not show any evidence of infiltrate or pneumothorax.  Unfortunately will be a delay in her work-up due to chemistry labs being down.  Therefore would not have timely result of troponin and metabolic. [PR]  2132 Patient with persistent pain did improve with nitro and morphine.  Initial troponin is negative but she is hypertensive.  Given her age and risk factors with abnormal EKG on arrival do feel that hospitalization for formal cardiac work-up clinically indicated.  Patient agrees with plan. [PR]  2150 Hospitalist consulted for admission. [PR]    Clinical Course User Index [PR] Merlyn Lot, MD    FINAL CLINICAL IMPRESSION(S) / ED DIAGNOSES   Final diagnoses:  Unstable angina ([place])     Rx / DC Orders   ED Discharge Orders     None        Note:  This document was prepared using Dragon voice recognition software and may include unintentional dictation errors.    Merlyn Lot, MD 05/30/22 2154

## 2022-05-30 NOTE — Assessment & Plan Note (Addendum)
-   The patient will be placed on high resistant supplemental coverage with NovoLog. - We will continue basal coverage. - We will hold off her metformin and continue Invokana.

## 2022-05-30 NOTE — ED Notes (Signed)
Pt assisted to bedside commode

## 2022-05-30 NOTE — Assessment & Plan Note (Signed)
-   We will continue her antihypertensives. 

## 2022-05-30 NOTE — ED Notes (Signed)
Dr. Mansy @ the bedside. 

## 2022-05-30 NOTE — Assessment & Plan Note (Addendum)
-   The patient be admitted to a cardiac telemetry observation bed. - We will start her on IV heparin drip. - She will be placed on high-dose statin therapy. - We will place on as needed sublingual nitroglycerin and IV morphine sulfate for pain. - We will continue her aspirin, beta-blocker therapy with Toprol-XL as well as Imdur. - We will hold off long-acting beta agonist. - 2D echo will be obtained. - Cardiology consult will be obtained. - I notified Dr. Nehemiah Massed about the patient.

## 2022-05-30 NOTE — Assessment & Plan Note (Signed)
-   We will continue her Celexa and Wellbutrin XL.

## 2022-05-31 ENCOUNTER — Other Ambulatory Visit: Payer: Self-pay

## 2022-05-31 ENCOUNTER — Observation Stay
Admit: 2022-05-31 | Discharge: 2022-05-31 | Disposition: A | Discharge status: Home / Self Care | Payer: Medicare Other | Attending: Family Medicine | Admitting: Family Medicine

## 2022-05-31 DIAGNOSIS — F17210 Nicotine dependence, cigarettes, uncomplicated: Secondary | ICD-10-CM | POA: Diagnosis present

## 2022-05-31 DIAGNOSIS — M797 Fibromyalgia: Secondary | ICD-10-CM | POA: Diagnosis present

## 2022-05-31 DIAGNOSIS — K219 Gastro-esophageal reflux disease without esophagitis: Secondary | ICD-10-CM | POA: Diagnosis present

## 2022-05-31 DIAGNOSIS — Z8249 Family history of ischemic heart disease and other diseases of the circulatory system: Secondary | ICD-10-CM | POA: Diagnosis not present

## 2022-05-31 DIAGNOSIS — Z7982 Long term (current) use of aspirin: Secondary | ICD-10-CM | POA: Diagnosis not present

## 2022-05-31 DIAGNOSIS — F32A Depression, unspecified: Secondary | ICD-10-CM | POA: Diagnosis present

## 2022-05-31 DIAGNOSIS — E785 Hyperlipidemia, unspecified: Secondary | ICD-10-CM | POA: Diagnosis present

## 2022-05-31 DIAGNOSIS — Z91018 Allergy to other foods: Secondary | ICD-10-CM | POA: Diagnosis not present

## 2022-05-31 DIAGNOSIS — I10 Essential (primary) hypertension: Secondary | ICD-10-CM | POA: Diagnosis present

## 2022-05-31 DIAGNOSIS — E876 Hypokalemia: Secondary | ICD-10-CM | POA: Diagnosis present

## 2022-05-31 DIAGNOSIS — I25110 Atherosclerotic heart disease of native coronary artery with unstable angina pectoris: Secondary | ICD-10-CM | POA: Diagnosis present

## 2022-05-31 DIAGNOSIS — Z7951 Long term (current) use of inhaled steroids: Secondary | ICD-10-CM | POA: Diagnosis not present

## 2022-05-31 DIAGNOSIS — Z880 Allergy status to penicillin: Secondary | ICD-10-CM | POA: Diagnosis not present

## 2022-05-31 DIAGNOSIS — I200 Unstable angina: Secondary | ICD-10-CM | POA: Diagnosis present

## 2022-05-31 DIAGNOSIS — Z794 Long term (current) use of insulin: Secondary | ICD-10-CM | POA: Diagnosis not present

## 2022-05-31 DIAGNOSIS — J449 Chronic obstructive pulmonary disease, unspecified: Secondary | ICD-10-CM | POA: Diagnosis present

## 2022-05-31 DIAGNOSIS — Z7984 Long term (current) use of oral hypoglycemic drugs: Secondary | ICD-10-CM | POA: Diagnosis not present

## 2022-05-31 DIAGNOSIS — I214 Non-ST elevation (NSTEMI) myocardial infarction: Secondary | ICD-10-CM | POA: Diagnosis present

## 2022-05-31 DIAGNOSIS — Z79899 Other long term (current) drug therapy: Secondary | ICD-10-CM | POA: Diagnosis not present

## 2022-05-31 DIAGNOSIS — E1165 Type 2 diabetes mellitus with hyperglycemia: Secondary | ICD-10-CM | POA: Diagnosis present

## 2022-05-31 DIAGNOSIS — Z8673 Personal history of transient ischemic attack (TIA), and cerebral infarction without residual deficits: Secondary | ICD-10-CM | POA: Diagnosis not present

## 2022-05-31 DIAGNOSIS — Z886 Allergy status to analgesic agent status: Secondary | ICD-10-CM | POA: Diagnosis not present

## 2022-05-31 LAB — CBC
HCT: 37.6 % (ref 36.0–46.0)
Hemoglobin: 12.5 g/dL (ref 12.0–15.0)
MCH: 30 pg (ref 26.0–34.0)
MCHC: 33.2 g/dL (ref 30.0–36.0)
MCV: 90.2 fL (ref 80.0–100.0)
Platelets: 282 10*3/uL (ref 150–400)
RBC: 4.17 MIL/uL (ref 3.87–5.11)
RDW: 13.2 % (ref 11.5–15.5)
WBC: 9.7 10*3/uL (ref 4.0–10.5)
nRBC: 0 % (ref 0.0–0.2)

## 2022-05-31 LAB — TROPONIN I (HIGH SENSITIVITY)
Troponin I (High Sensitivity): 3031 ng/L (ref <18)
Troponin I (High Sensitivity): 356 ng/L (ref <18)
Troponin I (High Sensitivity): 538 ng/L (ref <18)
Troponin I (High Sensitivity): 947 ng/L (ref <18)

## 2022-05-31 LAB — BASIC METABOLIC PANEL
Anion gap: 8 (ref 5–15)
BUN: 16 mg/dL (ref 8–23)
CO2: 25 mmol/L (ref 22–32)
Calcium: 9 mg/dL (ref 8.9–10.3)
Chloride: 105 mmol/L (ref 98–111)
Creatinine, Ser: 1.01 mg/dL — ABNORMAL HIGH (ref 0.44–1.00)
GFR, Estimated: >60 mL/min (ref >60)
Glucose, Bld: 303 mg/dL — ABNORMAL HIGH (ref 70–99)
Potassium: 3.7 mmol/L (ref 3.5–5.1)
Sodium: 138 mmol/L (ref 135–145)

## 2022-05-31 LAB — GLUCOSE, CAPILLARY
Glucose-Capillary: 147 mg/dL — ABNORMAL HIGH (ref 70–99)
Glucose-Capillary: 168 mg/dL — ABNORMAL HIGH (ref 70–99)

## 2022-05-31 LAB — HEPARIN LEVEL (UNFRACTIONATED)
Heparin Unfractionated: 0.3 IU/mL (ref 0.30–0.70)
Heparin Unfractionated: 0.51 IU/mL (ref 0.30–0.70)

## 2022-05-31 LAB — CBG MONITORING, ED
Glucose-Capillary: 285 mg/dL — ABNORMAL HIGH (ref 70–99)
Glucose-Capillary: 145 mg/dL — ABNORMAL HIGH (ref 70–99)

## 2022-05-31 LAB — HIV ANTIBODY (ROUTINE TESTING W REFLEX): HIV Screen 4th Generation wRfx: NONREACTIVE

## 2022-05-31 MED ORDER — GABAPENTIN 400 MG PO CAPS
800.0000 mg | ORAL_CAPSULE | Freq: Two times a day (BID) | ORAL | Status: DC
  Administered 2022-05-31 – 2022-06-02 (×4): 800 mg via ORAL
  Filled 2022-05-31 (×4): qty 2

## 2022-05-31 MED ORDER — INSULIN ASPART PROT & ASPART (70-30 MIX) 100 UNIT/ML ~~LOC~~ SUSP
30.0000 [IU] | Freq: Two times a day (BID) | SUBCUTANEOUS | Status: DC
  Administered 2022-05-31 – 2022-06-02 (×4): 30 [IU] via SUBCUTANEOUS
  Filled 2022-05-31 (×2): qty 10

## 2022-05-31 MED ORDER — INSULIN ASPART 100 UNIT/ML IJ SOLN
0.0000 [IU] | Freq: Three times a day (TID) | INTRAMUSCULAR | Status: DC
  Administered 2022-05-31: 4 [IU] via SUBCUTANEOUS
  Administered 2022-05-31: 11 [IU] via SUBCUTANEOUS
  Administered 2022-05-31 (×2): 3 [IU] via SUBCUTANEOUS
  Administered 2022-06-01: 7 [IU] via SUBCUTANEOUS
  Administered 2022-06-01 (×2): 11 [IU] via SUBCUTANEOUS
  Administered 2022-06-01: 4 [IU] via SUBCUTANEOUS
  Administered 2022-06-02: 7 [IU] via SUBCUTANEOUS
  Filled 2022-05-31 (×9): qty 1

## 2022-05-31 MED ORDER — ROSUVASTATIN CALCIUM 10 MG PO TABS
40.0000 mg | ORAL_TABLET | Freq: Every evening | ORAL | Status: DC
  Administered 2022-05-31 – 2022-06-01 (×2): 40 mg via ORAL
  Filled 2022-05-31: qty 4
  Filled 2022-05-31: qty 2

## 2022-05-31 MED ORDER — BENZONATATE 100 MG PO CAPS: 200.0000 mg | ORAL_CAPSULE | Freq: Three times a day (TID) | ORAL | Status: DC | PRN

## 2022-05-31 MED ORDER — CANAGLIFLOZIN 100 MG PO TABS
100.0000 mg | ORAL_TABLET | Freq: Every day | ORAL | Status: DC
  Administered 2022-05-31 – 2022-06-02 (×3): 100 mg via ORAL
  Filled 2022-05-31 (×3): qty 1

## 2022-05-31 MED ORDER — PRAVASTATIN SODIUM 20 MG PO TABS: 20.0000 mg | ORAL_TABLET | Freq: Every day | ORAL | Status: DC

## 2022-05-31 MED ORDER — LOSARTAN POTASSIUM 50 MG PO TABS
50.0000 mg | ORAL_TABLET | Freq: Every day | ORAL | Status: DC
  Administered 2022-05-31 – 2022-06-02 (×3): 50 mg via ORAL
  Filled 2022-05-31 (×3): qty 1

## 2022-05-31 MED ORDER — ONDANSETRON 4 MG PO TBDP
4.0000 mg | ORAL_TABLET | Freq: Three times a day (TID) | ORAL | Status: DC | PRN
Start: 2022-05-31 — End: 2022-05-31

## 2022-05-31 MED ORDER — GABAPENTIN 400 MG PO CAPS
400.0000 mg | ORAL_CAPSULE | Freq: Every day | ORAL | Status: DC
  Administered 2022-05-31 – 2022-06-01 (×2): 400 mg via ORAL
  Filled 2022-05-31 (×3): qty 1

## 2022-05-31 MED ORDER — PANTOPRAZOLE SODIUM 40 MG PO TBEC: 40.0000 mg | DELAYED_RELEASE_TABLET | Freq: Every day | ORAL | Status: DC

## 2022-05-31 MED ORDER — HYDROCODONE-ACETAMINOPHEN 10-325 MG PO TABS
1.0000 | ORAL_TABLET | Freq: Four times a day (QID) | ORAL | Status: DC | PRN
  Administered 2022-05-31 – 2022-06-02 (×4): 1 via ORAL
  Filled 2022-05-31 (×4): qty 1

## 2022-05-31 MED ORDER — INSULIN ISOPHANE HUMAN 100 UNIT/ML KWIKPEN: 40.0000 [IU] | PEN_INJECTOR | Freq: Two times a day (BID) | SUBCUTANEOUS | Status: DC

## 2022-05-31 MED ORDER — METOPROLOL SUCCINATE ER 50 MG PO TB24
50.0000 mg | ORAL_TABLET | Freq: Every day | ORAL | Status: DC
  Administered 2022-05-31 – 2022-06-01 (×2): 50 mg via ORAL
  Filled 2022-05-31 (×2): qty 1

## 2022-05-31 MED ORDER — GABAPENTIN 800 MG PO TABS
400.0000 mg | ORAL_TABLET | Freq: Every day | ORAL | Status: DC
  Filled 2022-05-31: qty 0.5

## 2022-05-31 MED ORDER — ROSUVASTATIN CALCIUM 20 MG PO TABS: 40.0000 mg | ORAL_TABLET | Freq: Every evening | ORAL | Status: DC

## 2022-05-31 MED ORDER — CYCLOBENZAPRINE HCL 10 MG PO TABS
10.0000 mg | ORAL_TABLET | Freq: Three times a day (TID) | ORAL | Status: DC | PRN
  Administered 2022-05-31 – 2022-06-01 (×2): 10 mg via ORAL
  Filled 2022-05-31 (×2): qty 1

## 2022-05-31 MED ORDER — CITALOPRAM HYDROBROMIDE 20 MG PO TABS
20.0000 mg | ORAL_TABLET | ORAL | Status: DC
  Administered 2022-05-31 – 2022-06-02 (×3): 20 mg via ORAL
  Filled 2022-05-31 (×4): qty 1

## 2022-05-31 MED ORDER — ATORVASTATIN CALCIUM 20 MG PO TABS
80.0000 mg | ORAL_TABLET | Freq: Every day | ORAL | Status: DC
Start: 2022-05-31 — End: 2022-05-31

## 2022-05-31 MED ORDER — LIRAGLUTIDE 18 MG/3ML ~~LOC~~ SOPN
1.8000 mg | PEN_INJECTOR | SUBCUTANEOUS | Status: DC
Start: 2022-05-31 — End: 2022-05-31

## 2022-05-31 NOTE — ED Notes (Signed)
Pt transitioned to a hospital bed to promote comfort.  °

## 2022-05-31 NOTE — Progress Notes (Signed)
[place] for heparin infusion Indication: ACS/STEMI  Allergies  Allergen Reactions   Ibuprofen Other (See Comments)    N/V/ salivate   Peach [Prunus Persica] Itching and Swelling    Mouth itches and swells   Penicillins Rash    Patient Measurements: Height: '5\' 4"'$  (162.6 cm) Weight: 81.6 kg (180 lb) IBW/kg (Calculated) : 54.7 Heparin Dosing Weight: 72.4 kg  Vital Signs: Temp: 98.3 F (36.8 C) (08/12 0335) Temp Source: Oral (08/12 0335) BP: 109/65 (08/12 0530) Pulse Rate: 86 (08/12 0530)  Labs: Recent Labs    05/30/22 1944 05/30/22 2141 05/30/22 2237 05/31/22 0005 05/31/22 0510  HGB 13.2  --   --   --  12.5  HCT 40.1  --   --   --  37.6  PLT 279  --   --   --  282  APTT 32  --   --   --   --   LABPROT 12.7  --   --   --   --   INR 1.0  --   --   --   --   HEPARINUNFRC  --   --   --   --  0.30  CREATININE 1.13*  --   --   --  1.01*  TROPONINIHS 11 356* 538* 947*  --      Estimated Creatinine Clearance: 60.5 mL/min (A) (by C-G formula based on SCr of 1.01 mg/dL (H)).   Medical History: Past Medical History:  Diagnosis Date   Asthma    Bilateral low back pain with bilateral sciatica    Bulging lumbar disc    COPD (chronic obstructive pulmonary disease) (HCC)    Coronary artery disease    Diabetes mellitus without complication (HCC)    Fibromyalgia    GERD (gastroesophageal reflux disease)    Hyperlipidemia    Hypertension    Stroke [facility]) 04/2021    Assessment: Pt is a 61 yo female presenting to ED c/o chest pain, found with nSTEMI.  Goal of Therapy:  Heparin level 0.3-0.7 units/ml Monitor platelets by anticoagulation protocol: Yes   8/12 0510 HL 0.3, therapeutic x 1  Plan:  Continue heparin infusion at 950 units/hr Will recheck HL in 6 hr to confirm CBC daily while on heparin  Renda Rolls, PharmD, [facility] 05/31/2022 5:42 AM

## 2022-05-31 NOTE — ED Notes (Signed)
Secretary attempted to call Dr. Sidney Ace again - awaiting response.

## 2022-05-31 NOTE — Progress Notes (Signed)
PROGRESS NOTE    Anne Brennan  KDT:[id number] DOB: 26-Apr-1961 DOA: 05/30/2022 PCP: Idelle Crouch, MD    Brief Narrative:  61 y.o. African-American female with medical history significant for asthma, COPD, coronary artery disease, type 2 diabetes mellitus, fibromyalgia, GERD, hypertension, dyslipidemia and CVA, presented to the emergency room with acute onset of intermittent substernal chest pain felt like pressure graded 10/10 in severity and worsening with exertion with associated dyspnea for the last couple weeks.  She denied any associated nausea or vomiting or diaphoresis.  She stated she felt hot though.  She denies any dyspnea or palpitations.  No leg pain or edema or recent travels or surgeries.  She has been having cough with clear sputum production without wheezing.  No dysuria, oliguria or hematuria or flank pain.  No bleeding diathesis.  No fever or chills.  No bleeding diathesis.  Uptrending troponins consistent with NSTEMI.  Cardiology engaged.  On heparin gtt.  Plan for cardiac catheterization Monday 8/14.   Assessment & Plan:   Principal Problem:   NSTEMI (non-ST elevated myocardial infarction) [facility]) Active Problems:   Essential hypertension   Uncontrolled type 2 diabetes mellitus with hyperglycemia, with long-term current use of insulin (HCC)   Chronic obstructive pulmonary disease (COPD) (HCC)   Depression   Dyslipidemia  NSTEMI Chest pain Uptrending troponins, last troponin 3000 Cardiology on board, [place] [facility] Plan: Okay for diet Continue heparin GTT Aspirin, statin, beta-blocker Long-acting nitrates 2D echocardiogram Neurology plan cardiac catheterization Monday 8/14  Type 2 diabetes mellitus uncontrolled with hyperglycemia Basal bolus coverage Hold metformin Continue home Invokana Carb modified diet  Essential hypertension PTA beta-blocker PTA ARB  COPD Not acutely exacerbated PTA Singulair As needed  bronchodilators  Depression PTA Celexa and Wellbutrin  Hyperlipidemia High intensity statin   DVT prophylaxis: Heparin GTT for Code Status:  Family Communication: None today Disposition Plan: Status is: Observation The patient will require care spanning > 2 midnights and should be moved to inpatient because: NSTEMI on heparin gtt.  Plan for cardiac catheterization 8/14   Level of care: Telemetry Cardiac  Consultants:  Cardiology-[facility]  Procedures:  None  Antimicrobials: None   Subjective: And examined.  Chest pain improved  Objective: Vitals:   05/31/22 0900 05/31/22 1000 05/31/22 1100 05/31/22 1130  BP: 108/64 (!) 144/77 125/79 (!) 108/59  Pulse: 85 84 77 74  Resp: 15 (!) '23 11 17  '$ Temp: 98.6 F (37 C)     TempSrc: Oral     SpO2: 93% 95% 95% 94%  Weight:      Height:        Intake/Output Summary (Last 24 hours) at 05/31/2022 1245 Last data filed at 05/31/2022 0401 Gross per 24 hour  Intake --  Output 600 ml  Net -600 ml   Filed Weights   05/30/22 1940  Weight: 81.6 kg    Examination:  General exam: NAD Respiratory system: Clear to auscultation. Respiratory effort normal.  Room air Cardiovascular system: S1-S2, RRR, no murmurs, no pedal edema Gastrointestinal system: Soft, NT/ND, normal bowel sounds Central nervous system: Alert and oriented. No focal neurological deficits. Extremities: Symmetric 5 x 5 power. Skin: No rashes, lesions or ulcers Psychiatry: Judgement and insight appear normal. Mood & affect appropriate.     Data Reviewed: I have personally reviewed following labs and imaging studies  CBC: Recent Labs  Lab 05/30/22 1944 05/31/22 0510  WBC 6.6 9.7  HGB 13.2 12.5  HCT 40.1 37.6  MCV 91.6 90.2  PLT 279  466   Basic Metabolic Panel: Recent Labs  Lab 05/30/22 1944 05/31/22 0510  NA 139 138  K 3.0* 3.7  CL 104 105  CO2 23 25  GLUCOSE 431* 303*  BUN 14 16  CREATININE 1.13* 1.01*  CALCIUM 8.5* 9.0    GFR: Estimated Creatinine Clearance: 60.5 mL/min (A) (by C-G formula based on SCr of 1.01 mg/dL (H)). Liver Function Tests: No results for input(s): "AST", "ALT", "ALKPHOS", "BILITOT", "PROT", "ALBUMIN" in the last 168 hours. No results for input(s): "LIPASE", "AMYLASE" in the last 168 hours. No results for input(s): "AMMONIA" in the last 168 hours. Coagulation Profile: Recent Labs  Lab 05/30/22 1944  INR 1.0   Cardiac Enzymes: No results for input(s): "CKTOTAL", "CKMB", "CKMBINDEX", "TROPONINI" in the last 168 hours. BNP (last 3 results) No results for input(s): "PROBNP" in the last 8760 hours. HbA1C: No results for input(s): "HGBA1C" in the last 72 hours. CBG: Recent Labs  Lab 05/31/22 0800  GLUCAP 285*   Lipid Profile: No results for input(s): "CHOL", "HDL", "LDLCALC", "TRIG", "CHOLHDL", "LDLDIRECT" in the last 72 hours. Thyroid Function Tests: No results for input(s): "TSH", "T4TOTAL", "FREET4", "T3FREE", "THYROIDAB" in the last 72 hours. Anemia Panel: No results for input(s): "VITAMINB12", "FOLATE", "FERRITIN", "TIBC", "IRON", "RETICCTPCT" in the last 72 hours. Sepsis Labs: No results for input(s): "PROCALCITON", "LATICACIDVEN" in the last 168 hours.  No results found for this or any previous visit (from the past 240 hour(s)).       Radiology Studies: DG Chest 2 View  Result Date: 05/30/2022 CLINICAL DATA:  Right-sided chest pain beginning last night. EXAM: CHEST - 2 VIEW COMPARISON:  05/02/2022 FINDINGS: The heart size and mediastinal contours are within normal limits. Both lungs are clear. The visualized skeletal structures are unremarkable. IMPRESSION: No active cardiopulmonary disease. Electronically Signed   By: Marlaine Hind M.D.   On: 05/30/2022 20:13        Scheduled Meds:  amLODipine  5 mg Oral Q breakfast   aspirin EC  325 mg Oral Daily   buPROPion ER  100 mg Oral BID   canagliflozin  100 mg Oral QAC breakfast   citalopram  20 mg Oral [id number]    insulin aspart  0-20 Units Subcutaneous TID AC & HS   insulin aspart protamine- aspart  44 Units Subcutaneous BID WC   isosorbide mononitrate  30 mg Oral Daily   loratadine  10 mg Oral QPM   losartan  50 mg Oral Daily   metoprolol succinate  50 mg Oral Q breakfast   montelukast  10 mg Oral QHS   pantoprazole  40 mg Oral Daily   rosuvastatin  40 mg Oral QPM   traZODone  100 mg Oral QHS   triamterene-hydrochlorothiazide  1 tablet Oral Q breakfast   Continuous Infusions:  heparin 950 Units/hr (05/30/22 2337)     LOS: 0 days     Sidney Ace, MD [facility]   If 7PM-7AM, please contact night-coverage  05/31/2022, 12:45 PM

## 2022-05-31 NOTE — Progress Notes (Signed)
ANTICOAGULATION CONSULT NOTE  Pharmacy Consult for heparin infusion Indication: ACS/STEMI  Allergies  Allergen Reactions   Ibuprofen Other (See Comments)    N/V/ salivate   Peach [Prunus Persica] Itching and Swelling    Mouth itches and swells   Penicillins Rash    Patient Measurements: Height: '5\' 4"'$  (162.6 cm) Weight: 81.6 kg (180 lb) IBW/kg (Calculated) : 54.7 Heparin Dosing Weight: 72.4 kg  Vital Signs: Temp: 98.4 F (36.9 C) (08/12 1300) Temp Source: Oral (08/12 1300) BP: 121/70 (08/12 1300) Pulse Rate: 78 (08/12 1300)  Labs: Recent Labs    05/30/22 1944 05/30/22 2141 05/30/22 2237 05/31/22 0005 05/31/22 0510 05/31/22 0957 05/31/22 1135  HGB 13.2  --   --   --  12.5  --   --   HCT 40.1  --   --   --  37.6  --   --   PLT 279  --   --   --  282  --   --   APTT 32  --   --   --   --   --   --   LABPROT 12.7  --   --   --   --   --   --   INR 1.0  --   --   --   --   --   --   HEPARINUNFRC  --   --   --   --  0.30  --  0.51  CREATININE 1.13*  --   --   --  1.01*  --   --   TROPONINIHS 11   < > 538* 947*  --  3,031*  --    < > = values in this interval not displayed.     Estimated Creatinine Clearance: 60.5 mL/min (A) (by C-G formula based on SCr of 1.01 mg/dL (H)).   Medical History: Past Medical History:  Diagnosis Date   Asthma    Bilateral low back pain with bilateral sciatica    Bulging lumbar disc    COPD (chronic obstructive pulmonary disease) (HCC)    Coronary artery disease    Diabetes mellitus without complication (HCC)    Fibromyalgia    GERD (gastroesophageal reflux disease)    Hyperlipidemia    Hypertension    Stroke [facility]) 04/2021    Assessment: Pt is a 61 yo female presenting to ED c/o chest pain, found with nSTEMI.  Goal of Therapy:  Heparin level 0.3-0.7 units/ml Monitor platelets by anticoagulation protocol: Yes   8/12 0510 HL 0.3, therapeutic x 1 8/12 1135 HL 0.51  therapeutic x2  Plan:  Continue heparin infusion at 950  units/hr Will f/u  HL with am labs CBC daily while on heparin Plan cardiac cath on 8/14  Chinita Greenland PharmD Clinical Pharmacist 05/31/2022

## 2022-05-31 NOTE — ED Notes (Signed)
Admitting MD made aware of repeated trop value, pt still denying CP at this time.

## 2022-05-31 NOTE — Consult Note (Signed)
[facility] Cardiology Consultation Note  Patient ID: EQUILLA QUE, MRN: [id number], DOB/AGE: July 18, 1961 61 y.o. Admit date: 05/30/2022   Date of Consult: 05/31/2022 Primary Physician: Idelle Crouch, MD Primary Cardiologist: [facility]  Chief Complaint:  Chief Complaint  Patient presents with   Chest Pain   Reason for Consult:  Chest pain  HPI: 61 y.o. female with known coronary artery disease chronic obstructive pulmonary disease diabetes hypertension hyperlipidemia who has on appropriate medication management for risk factor treatment.  The patient claims that she has been mildly short of breath with physical activity recently and has had significant new onset of substernal chest pain and pressure radiating into her back.  This lasted for several hours before seen in the emergency room.  At that time the patient had a chest x-ray showing normal chest x-ray for which we will have reviewed.  Additionally EKG for which we reviewed shows normal sinus rhythm with possible septal infarct age undetermined.  Additionally the patient has had a troponin of 356/538/947 all consistent with an acute coronary syndrome.  Currently she is hemodynamically stable and has been placed on appropriate medication management including heparin with no further chest discomfort at this time.  Past Medical History:  Diagnosis Date   Asthma    Bilateral low back pain with bilateral sciatica    Bulging lumbar disc    COPD (chronic obstructive pulmonary disease) (HCC)    Coronary artery disease    Diabetes mellitus without complication (HCC)    Fibromyalgia    GERD (gastroesophageal reflux disease)    Hyperlipidemia    Hypertension    Stroke [place] 04/2021      Surgical History:  Past Surgical History:  Procedure Laterality Date   ABDOMINAL HYSTERECTOMY     KNEE ARTHROSCOPY Right    MICROLARYNGOSCOPY Bilateral 10/23/2021   Procedure: MICROLARYNGOSCOPY WITH EXCISION VOCAL CORD POLYPS;  Surgeon:  Clyde Canterbury, MD;  Location: [facility] ORS;  Service: ENT;  Laterality: Bilateral;  Diabetic     Home Meds: Prior to Admission medications   Medication Sig Start Date End Date Taking? Authorizing Provider  amLODipine (NORVASC) 5 MG tablet Take 5 mg by mouth every morning. 05/15/21  Yes [provider]  aspirin EC 325 MG tablet Take 1 tablet (325 mg total) by mouth daily. 05/03/22  Yes Ivor Costa, MD  Azelastine HCl 137 MCG/SPRAY SOLN SMARTSIG:1-2 Spray(s) Both Nares Twice Daily 04/19/22  Yes [provider]  buPROPion ER (WELLBUTRIN SR) 100 MG 12 hr tablet Take 100 mg by mouth 2 (two) times daily. 04/06/22  Yes [provider]  canagliflozin (INVOKANA) 100 MG TABS tablet Take by mouth daily before breakfast.   Yes [provider]  citalopram (CELEXA) 20 MG tablet Take 20 mg by mouth every morning. 02/03/21  Yes [provider]  fluticasone (FLONASE) 50 MCG/ACT nasal spray Place 1 spray into both nostrils at bedtime.   Yes [provider]  gabapentin (NEURONTIN) 400 MG capsule Take 800 mg by mouth 3 (three) times daily as needed. 04/30/21  Yes [provider]  HUMULIN N KWIKPEN 100 UNIT/ML KwikPen Inject 40 Units into the skin in the morning and at bedtime. 05/29/22  Yes [provider]  isosorbide mononitrate (IMDUR) 30 MG 24 hr tablet Take 1 tablet (30 mg total) by mouth daily. 05/02/22  Yes Ivor Costa, MD  levocetirizine (XYZAL) 5 MG tablet Take 5 mg by mouth every evening.   Yes [provider]  liraglutide (VICTOZA) 18 MG/3ML SOPN  Inject 1.8 mg into the skin every morning.   Yes [provider]  liraglutide (VICTOZA) 18 MG/3ML SOPN Inject into the skin. 05/16/22  Yes [provider]  metFORMIN (GLUCOPHAGE-XR) 500 MG 24 hr tablet Take 1,000 mg by mouth daily. 01/11/21  Yes [provider]  metoprolol succinate (TOPROL-XL) 50 MG 24 hr tablet Take 50 mg by mouth every morning. 01/05/21  Yes [provider]  montelukast (SINGULAIR) 10 MG tablet Take 10 mg by mouth at bedtime. 04/26/21  Yes [provider]  NOVOLIN 70/30 (70-30) 100 UNIT/ML injection Inject 44 Units into the skin 2 (two) times daily. 04/29/21  Yes [provider]  omeprazole (PRILOSEC) 20 MG capsule Take 20 mg by mouth 2 (two) times daily. 05/08/22  Yes [provider]  pravastatin (PRAVACHOL) 20 MG tablet Take 20 mg by mouth every evening.   Yes [provider]  predniSONE (DELTASONE) 10 MG tablet Take 10 mg by mouth as directed. Taper 05/30/22  Yes [provider]  traZODone (DESYREL) 100 MG tablet Take 100 mg by mouth at bedtime. 04/24/21  Yes [provider]  triamterene-hydrochlorothiazide (MAXZIDE-25) 37.5-25 MG tablet Take 1 tablet by mouth every morning. 05/15/21  Yes [provider]  albuterol (VENTOLIN HFA) 108 (90 Base) MCG/ACT inhaler Inhale 2 puffs into the lungs every 4 (four) hours as needed for shortness of breath or wheezing. 04/30/21   [provider]  atorvastatin (LIPITOR) 40 MG tablet Take 1 tablet (40 mg total) by mouth daily. Patient not taking: Reported on 05/02/2022 05/20/21   Sharen Hones, MD  benzonatate (TESSALON) 200 MG capsule Take 200 mg by mouth 3 (three) times daily as needed. 05/21/22   [provider]  Budesonide-Formoterol Fumarate (SYMBICORT IN) Inhale into the lungs as needed. Patient not taking: Reported on 05/02/2022    [provider]  cyclobenzaprine (FLEXERIL) 10 MG tablet Take 10 mg by mouth 3 (three) times daily. 01/11/21   [provider]  HYDROcodone-acetaminophen (NORCO) 10-325 MG tablet Take 1 tablet by mouth 4 (four) times daily as needed. 05/30/22   [provider]  lisinopril (ZESTRIL) 10 MG tablet Take 10 mg by mouth daily.    [provider]  nicotine (NICODERM CQ - DOSED IN MG/24 HOURS) 21 mg/24hr patch Place 1 patch (21 mg total) onto the skin daily. Patient not  taking: Reported on 05/30/2022 05/03/22   Ivor Costa, MD  nitroGLYCERIN (NITROSTAT) 0.4 MG SL tablet Place 1 tablet (0.4 mg total) under the tongue every 5 (five) minutes as needed for chest pain. Patient not taking: Reported on 05/30/2022 05/02/22   Ivor Costa, MD  ondansetron (ZOFRAN-ODT) 4 MG disintegrating tablet Take 1 tablet (4 mg total) by mouth every 8 (eight) hours as needed. Patient not taking: Reported on 05/30/2022 12/13/21   Vladimir Crofts, MD  rosuvastatin (CRESTOR) 40 MG tablet Take 40 mg by mouth every evening. Patient not taking: Reported on 05/30/2022 04/19/22   [provider]    Inpatient Medications:   amLODipine  5 mg Oral Q breakfast   aspirin EC  325 mg Oral Daily   buPROPion ER  100 mg Oral BID   canagliflozin  100 mg Oral QAC breakfast   fluticasone  2 spray Each Nare Daily   insulin aspart  0-20 Units Subcutaneous TID AC & HS   insulin aspart protamine- aspart  44 Units Subcutaneous BID WC   isosorbide mononitrate  30 mg Oral Daily   loratadine  10 mg  Oral QPM   losartan  50 mg Oral Daily   metoprolol succinate  50 mg Oral Q breakfast   montelukast  10 mg Oral QHS   pantoprazole  40 mg Oral Daily   rosuvastatin  40 mg Oral QPM   traZODone  100 mg Oral QHS   triamterene-hydrochlorothiazide  1 tablet Oral Q breakfast    heparin 950 Units/hr (05/30/22 2337)    Allergies:  Allergies  Allergen Reactions   Ibuprofen Other (See Comments)    N/V/ salivate   Peach [Prunus Persica] Itching and Swelling    Mouth itches and swells   Penicillins Rash    Social History   Socioeconomic History   Marital status: Married    Spouse name: Not on file   Number of children: Not on file   Years of education: Not on file   Highest education level: Not on file  Occupational History   Not on file  Tobacco Use   Smoking status: Every Day    Packs/day: 0.50    Years: 44.00    Total pack years: 22.00    Types: Cigarettes   Smokeless tobacco: Never   Tobacco  comments:    Started age 74  Vaping Use   Vaping Use: Never used  Substance and Sexual Activity   Alcohol use: No   Drug use: Never   Sexual activity: Not on file  Other Topics Concern   Not on file  Social History Narrative   Not on file   Social Determinants of Health   Financial Resource Strain: Not on file  Food Insecurity: Not on file  Transportation Needs: Not on file  Physical Activity: Not on file  Stress: Not on file  Social Connections: Not on file  Intimate Partner Violence: Not on file     Family History  Problem Relation Age of Onset   Heart attack Mother    Heart attack Father    Heart failure Sister    Heart attack Brother      Review of Systems Positive for chest pain pressure Negative for: General:  chills, fever, night sweats or weight changes.  Cardiovascular: PND orthopnea syncope dizziness  Dermatological skin lesions rashes Respiratory: Cough congestion Urologic: Frequent urination urination at night and hematuria Abdominal: negative for nausea, vomiting, diarrhea, bright red blood per rectum, melena, or hematemesis Neurologic: negative for visual changes, and/or hearing changes  All other systems reviewed and are otherwise negative except as noted above.  Labs: No results for input(s): "CKTOTAL", "CKMB", "TROPONINI" in the last 72 hours. Lab Results  Component Value Date   WBC 9.7 05/31/2022   HGB 12.5 05/31/2022   HCT 37.6 05/31/2022   MCV 90.2 05/31/2022   PLT 282 05/31/2022    Recent Labs  Lab 05/31/22 0510  NA 138  K 3.7  CL 105  CO2 25  BUN 16  CREATININE 1.01*  CALCIUM 9.0  GLUCOSE 303*   Lab Results  Component Value Date   CHOL 114 05/02/2022   HDL 38 (L) 05/02/2022   LDLCALC 45 05/02/2022   TRIG 156 (H) 05/02/2022   Lab Results  Component Value Date   DDIMER 0.49 05/30/2022    Radiology/Studies:  DG Chest 2 View  Result Date: 05/30/2022 CLINICAL DATA:  Right-sided chest pain beginning last night. EXAM:  CHEST - 2 VIEW COMPARISON:  05/02/2022 FINDINGS: The heart size and mediastinal contours are within normal limits. Both lungs are clear. The visualized skeletal structures are unremarkable. IMPRESSION: No active  cardiopulmonary disease. Electronically Signed   By: Marlaine Hind M.D.   On: 05/30/2022 20:13   DG Chest 2 View  Result Date: 05/02/2022 CLINICAL DATA:  Chest pain EXAM: CHEST - 2 VIEW COMPARISON:  12/13/2022 FINDINGS: Cardiomediastinal silhouette and pulmonary vasculature are within normal limits. Lungs are clear. Unchanged minimal anterior wedging of the T11 and T12 vertebral bodies. IMPRESSION: No acute cardiopulmonary process. Electronically Signed   By: Miachel Roux M.D.   On: 05/02/2022 09:27    EKG: Normal sinus rhythm with septal infarct age undetermined  Weights: Filed Weights   05/30/22 1940  Weight: 81.6 kg     Physical Exam: Blood pressure (!) 144/77, pulse 84, temperature 98.6 F (37 C), temperature source Oral, resp. rate (!) 23, height '5\' 4"'$  (1.626 m), weight 81.6 kg, SpO2 95 %. Body mass index is 30.9 kg/m. General: Well developed, well nourished, in no acute distress. Head eyes ears nose throat: Normocephalic, atraumatic, sclera non-icteric, no xanthomas, nares are without discharge. No apparent thyromegaly and/or mass  Lungs: Normal respiratory effort.  no wheezes, no rales, no rhonchi.  Heart: RRR with normal S1 S2. no murmur gallop, no rub, PMI is normal size and placement, carotid upstroke normal without bruit, jugular venous pressure is normal Abdomen: Soft, non-tender, non-distended with normoactive bowel sounds. No hepatomegaly. No rebound/guarding. No obvious abdominal masses. Abdominal aorta is normal size without bruit Extremities: No edema. no cyanosis, no clubbing, no ulcers  Peripheral : 2+ bilateral upper extremity pulses, 2+ bilateral femoral pulses, 2+ bilateral dorsal pedal pulse Neuro: Alert and oriented. No facial asymmetry. No focal deficit.  Moves all extremities spontaneously. Musculoskeletal: Normal muscle tone without kyphosis Psych:  Responds to questions appropriately with a normal affect.    Assessment: 61 year old female with diabetes hypertension hyperlipidemia COPD with an acute non-ST elevation myocardial infarction without evidence of congestive heart failure.  Plan: 1.  Continue heparin aspirin for further risk reduction of acute coronary syndrome 2.  Serial ECG and enzymes to assess for extent of myocardial infarction 3.  Echocardiogram for LV systolic dysfunction valvular heart disease contributing to above 4.  Proceed to cardiac catheterization to assess coronary anatomy and further treatment thereof as necessary.  Patient understands the risk and benefits of cardiac catheterization.  This includes the possibility of death stroke heart attack infection bleeding blood clot.  She is at low risk for conscious sedation.  This will occur on Monday 5.  High intensity cholesterol therapy 6.  Further treatment options after above  Signed, Corey Skains M.D. [facility] 05/31/2022, 11:48 AM

## 2022-05-31 NOTE — Progress Notes (Signed)
*  PRELIMINARY RESULTS* Echocardiogram 2D Echocardiogram has been performed.  Anne Brennan January 05/31/2022, 1:48 PM

## 2022-05-31 NOTE — ED Notes (Signed)
Pt plan of care and diet status discussed with pt and MD

## 2022-05-31 NOTE — ED Notes (Signed)
Dr. Sidney Ace paged by secretary -awaiting response.

## 2022-05-31 NOTE — ED Notes (Addendum)
Dr. Sidney Ace notified of the patients increased troponin values - pt denies any worsening chest pain and is resting at this time. See orders for intervention.

## 2022-06-01 LAB — CBC
HCT: 39.9 % (ref 36.0–46.0)
Hemoglobin: 13.5 g/dL (ref 12.0–15.0)
MCH: 30.3 pg (ref 26.0–34.0)
MCHC: 33.8 g/dL (ref 30.0–36.0)
MCV: 89.5 fL (ref 80.0–100.0)
Platelets: 289 10*3/uL (ref 150–400)
RBC: 4.46 MIL/uL (ref 3.87–5.11)
RDW: 13.2 % (ref 11.5–15.5)
WBC: 10.2 10*3/uL (ref 4.0–10.5)
nRBC: 0 % (ref 0.0–0.2)

## 2022-06-01 LAB — GLUCOSE, CAPILLARY
Glucose-Capillary: 233 mg/dL — ABNORMAL HIGH (ref 70–99)
Glucose-Capillary: 176 mg/dL — ABNORMAL HIGH (ref 70–99)
Glucose-Capillary: 274 mg/dL — ABNORMAL HIGH (ref 70–99)
Glucose-Capillary: 272 mg/dL — ABNORMAL HIGH (ref 70–99)

## 2022-06-01 LAB — ECHOCARDIOGRAM COMPLETE
AR max vel: 1.82 cm2
AV Area VTI: 1.72 cm2
AV Area mean vel: 1.83 cm2
AV Mean grad: 3 mmHg
AV Peak grad: 4.7 mmHg
Ao pk vel: 1.08 m/s
Area-P 1/2: 4.26 cm2
Height: 64 in
S' Lateral: 3.37 cm
Weight: 2880 oz

## 2022-06-01 LAB — TROPONIN I (HIGH SENSITIVITY): Troponin I (High Sensitivity): 1175 ng/L (ref <18)

## 2022-06-01 LAB — HEPARIN LEVEL (UNFRACTIONATED)
Heparin Unfractionated: 0.25 IU/mL — ABNORMAL LOW (ref 0.30–0.70)
Heparin Unfractionated: 0.59 IU/mL (ref 0.30–0.70)
Heparin Unfractionated: 0.51 IU/mL (ref 0.30–0.70)

## 2022-06-01 MED ORDER — SODIUM CHLORIDE 0.9% FLUSH: 3.0000 mL | Freq: Two times a day (BID) | INTRAVENOUS | Status: DC

## 2022-06-01 MED ORDER — HEPARIN BOLUS VIA INFUSION
1100.0000 [IU] | Freq: Once | INTRAVENOUS | Status: AC
  Administered 2022-06-01: 1100 [IU] via INTRAVENOUS
  Filled 2022-06-01: qty 1100

## 2022-06-01 NOTE — Progress Notes (Signed)
ANTICOAGULATION CONSULT NOTE  Pharmacy Consult for heparin infusion Indication: ACS/STEMI  Allergies  Allergen Reactions   Ibuprofen Other (See Comments)    N/V/ salivate   Peach [Prunus Persica] Itching and Swelling    Mouth itches and swells   Penicillins Rash    Patient Measurements: Height: '5\' 4"'$  (162.6 cm) Weight: 81 kg (178 lb 8 oz) IBW/kg (Calculated) : 54.7 Heparin Dosing Weight: 72.4 kg  Vital Signs: Temp: 98 F (36.7 C) (08/13 1605) BP: 103/61 (08/13 1605) Pulse Rate: 66 (08/13 1605)  Labs: Recent Labs    05/30/22 1944 05/30/22 2141 05/31/22 0005 05/31/22 0510 05/31/22 0957 05/31/22 1135 06/01/22 0506 06/01/22 0735 06/01/22 1223 06/01/22 1830  HGB 13.2  --   --  12.5  --   --  13.5  --   --   --   HCT 40.1  --   --  37.6  --   --  39.9  --   --   --   PLT 279  --   --  282  --   --  289  --   --   --   APTT 32  --   --   --   --   --   --   --   --   --   LABPROT 12.7  --   --   --   --   --   --   --   --   --   INR 1.0  --   --   --   --   --   --   --   --   --   HEPARINUNFRC  --   --   --  0.30  --    < > 0.25*  --  0.59 0.51  CREATININE 1.13*  --   --  1.01*  --   --   --   --   --   --   TROPONINIHS 11   < > 947*  --  3,031*  --   --  1,175*  --   --    < > = values in this interval not displayed.     Estimated Creatinine Clearance: 60.2 mL/min (A) (by C-G formula based on SCr of 1.01 mg/dL (H)).   Medical History: Past Medical History:  Diagnosis Date   Asthma    Bilateral low back pain with bilateral sciatica    Bulging lumbar disc    COPD (chronic obstructive pulmonary disease) (HCC)    Coronary artery disease    Diabetes mellitus without complication (HCC)    Fibromyalgia    GERD (gastroesophageal reflux disease)    Hyperlipidemia    Hypertension    Stroke [facility]) 04/2021    Assessment: Pt is a 61 yo female presenting to ED c/o chest pain, found with nSTEMI.  Goal of Therapy:  Heparin level 0.3-0.7 units/ml Monitor  platelets by anticoagulation protocol: Yes   8/12 0510 HL 0.3, therapeutic x 1 8/12 1135 HL 0.51  therapeutic x2 8/13 0506 HL 0.25, subtherapeutic 8/13 1233 HL 0.59, therapeutic x1 8/13 1830 HL 0.51, therapeutic x2  Plan:  8/13 1830 HL 0.51, therapeutic x2 Continue heparin infusion at 1100 units/hr check HL with am labs CBC daily while on heparin Plan cardiac cath on 8/14  Chinita Greenland PharmD Clinical Pharmacist 06/01/2022

## 2022-06-01 NOTE — Progress Notes (Signed)
PROGRESS NOTE    Anne Brennan  JJK:[id number] DOB: 1961/05/07 DOA: 05/30/2022 PCP: Idelle Crouch, MD    Brief Narrative:  61 y.o. African-American female with medical history significant for asthma, COPD, coronary artery disease, type 2 diabetes mellitus, fibromyalgia, GERD, hypertension, dyslipidemia and CVA, presented to the emergency room with acute onset of intermittent substernal chest pain felt like pressure graded 10/10 in severity and worsening with exertion with associated dyspnea for the last couple weeks.  She denied any associated nausea or vomiting or diaphoresis.  She stated she felt hot though.  She denies any dyspnea or palpitations.  No leg pain or edema or recent travels or surgeries.  She has been having cough with clear sputum production without wheezing.  No dysuria, oliguria or hematuria or flank pain.  No bleeding diathesis.  No fever or chills.  No bleeding diathesis.  Uptrending troponins consistent with NSTEMI.  Cardiology engaged.  On heparin gtt.  Plan for cardiac catheterization Monday 8/14.   Assessment & Plan:   Principal Problem:   NSTEMI (non-ST elevated myocardial infarction) ([place]) Active Problems:   Essential hypertension   Uncontrolled type 2 diabetes mellitus with hyperglycemia, with long-term current use of insulin (HCC)   Chronic obstructive pulmonary disease (COPD) (HCC)   Depression   Dyslipidemia  NSTEMI Chest pain Troponin peaked at 3000, now downtrending No chest pain Cardiology on board, [facility] Plan: Continue heparin GTT Aspirin, statin, beta-blocker Long-acting nitrates Follow-up 2D echocardiogram N.p.o. after midnight Left heart cath tomorrow 8/14  Type 2 diabetes mellitus uncontrolled with hyperglycemia Basal bolus coverage Holding metformin Continue home Invokana Carb modified diet  Essential hypertension PTA beta-blocker PTA ARB  COPD Not acutely exacerbated PTA Singulair As needed  bronchodilators  Depression PTA Celexa and Wellbutrin  Hyperlipidemia High intensity statin   DVT prophylaxis: Heparin GTT Code Status: Full Family Communication: None today.  Offered to call but patient declined Disposition Plan: Status is: Inpatient Remains inpatient appropriate because: NSTEMI on heparin gtt.  Cardiac cath planned 8/14     Level of care: Telemetry Cardiac  Consultants:  Cardiology-[facility]  Procedures:  None  Antimicrobials: None   Subjective: Seen and examined.  Sitting up in bed.  No chest pain  Objective: Vitals:   05/31/22 2040 06/01/22 0010 06/01/22 0351 06/01/22 0812  BP: 111/68 106/64 120/74 115/75  Pulse: 76 80 81 71  Resp: '19 19 18 18  '$ Temp: 98.3 F (36.8 C) 97.6 F (36.4 C) 97.6 F (36.4 C) 98.1 F (36.7 C)  TempSrc: Oral Oral Oral   SpO2: 97% 92% 90% 91%  Weight:      Height:        Intake/Output Summary (Last 24 hours) at 06/01/2022 1109 Last data filed at 06/01/2022 0300 Gross per 24 hour  Intake 943.41 ml  Output 575 ml  Net 368.41 ml   Filed Weights   05/30/22 1940 05/31/22 1609  Weight: 81.6 kg 81 kg    Examination:  General exam: No acute distress Respiratory system: Clear.  Normal work of breathing.  Room air Cardiovascular system: S1-S2, RRR, no murmurs, no pedal edema Gastrointestinal system: Soft, NT/ND, normal bowel sounds Central nervous system: Alert and oriented. No focal neurological deficits. Extremities: Symmetric 5 x 5 power. Skin: No rashes, lesions or ulcers Psychiatry: Judgement and insight appear normal. Mood & affect appropriate.     Data Reviewed: I have personally reviewed following labs and imaging studies  CBC: Recent Labs  Lab 05/30/22 1944 05/31/22 0510 06/01/22  0506  WBC 6.6 9.7 10.2  HGB 13.2 12.5 13.5  HCT 40.1 37.6 39.9  MCV 91.6 90.2 89.5  PLT 279 282 326   Basic Metabolic Panel: Recent Labs  Lab 05/30/22 1944 05/31/22 0510  NA 139 138  K 3.0* 3.7  CL  104 105  CO2 23 25  GLUCOSE 431* 303*  BUN 14 16  CREATININE 1.13* 1.01*  CALCIUM 8.5* 9.0   GFR: Estimated Creatinine Clearance: 60.2 mL/min (A) (by C-G formula based on SCr of 1.01 mg/dL (H)). Liver Function Tests: No results for input(s): "AST", "ALT", "ALKPHOS", "BILITOT", "PROT", "ALBUMIN" in the last 168 hours. No results for input(s): "LIPASE", "AMYLASE" in the last 168 hours. No results for input(s): "AMMONIA" in the last 168 hours. Coagulation Profile: Recent Labs  Lab 05/30/22 1944  INR 1.0   Cardiac Enzymes: No results for input(s): "CKTOTAL", "CKMB", "CKMBINDEX", "TROPONINI" in the last 168 hours. BNP (last 3 results) No results for input(s): "PROBNP" in the last 8760 hours. HbA1C: No results for input(s): "HGBA1C" in the last 72 hours. CBG: Recent Labs  Lab 05/31/22 0800 05/31/22 1247 05/31/22 1609 05/31/22 2247 06/01/22 0810  GLUCAP 285* 145* 147* 168* 233*   Lipid Profile: No results for input(s): "CHOL", "HDL", "LDLCALC", "TRIG", "CHOLHDL", "LDLDIRECT" in the last 72 hours. Thyroid Function Tests: No results for input(s): "TSH", "T4TOTAL", "FREET4", "T3FREE", "THYROIDAB" in the last 72 hours. Anemia Panel: No results for input(s): "VITAMINB12", "FOLATE", "FERRITIN", "TIBC", "IRON", "RETICCTPCT" in the last 72 hours. Sepsis Labs: No results for input(s): "PROCALCITON", "LATICACIDVEN" in the last 168 hours.  No results found for this or any previous visit (from the past 240 hour(s)).       Radiology Studies: DG Chest 2 View  Result Date: 05/30/2022 CLINICAL DATA:  Right-sided chest pain beginning last night. EXAM: CHEST - 2 VIEW COMPARISON:  05/02/2022 FINDINGS: The heart size and mediastinal contours are within normal limits. Both lungs are clear. The visualized skeletal structures are unremarkable. IMPRESSION: No active cardiopulmonary disease. Electronically Signed   By: Marlaine Hind M.D.   On: 05/30/2022 20:13        Scheduled Meds:   amLODipine  5 mg Oral Q breakfast   aspirin EC  325 mg Oral Daily   buPROPion ER  100 mg Oral BID   canagliflozin  100 mg Oral QAC breakfast   citalopram  20 mg Oral BH-q7a   gabapentin  400 mg Oral QHS   gabapentin  800 mg Oral BID   insulin aspart  0-20 Units Subcutaneous TID AC & HS   insulin aspart protamine- aspart  30 Units Subcutaneous BID WC   isosorbide mononitrate  30 mg Oral Daily   loratadine  10 mg Oral QPM   losartan  50 mg Oral Daily   metoprolol succinate  50 mg Oral Q breakfast   montelukast  10 mg Oral QHS   pantoprazole  40 mg Oral Daily   rosuvastatin  40 mg Oral QPM   traZODone  100 mg Oral QHS   triamterene-hydrochlorothiazide  1 tablet Oral Q breakfast   Continuous Infusions:  heparin 1,100 Units/hr (06/01/22 7124)     LOS: 1 day     Sidney Ace, MD [facility]   If 7PM-7AM, please contact night-coverage  06/01/2022, 11:09 AM

## 2022-06-01 NOTE — Progress Notes (Signed)
ANTICOAGULATION CONSULT NOTE  Pharmacy Consult for heparin infusion Indication: ACS/STEMI  Allergies  Allergen Reactions   Ibuprofen Other (See Comments)    N/V/ salivate   Peach [Prunus Persica] Itching and Swelling    Mouth itches and swells   Penicillins Rash    Patient Measurements: Height: '5\' 4"'$  (162.6 cm) Weight: 81 kg (178 lb 8 oz) IBW/kg (Calculated) : 54.7 Heparin Dosing Weight: 72.4 kg  Vital Signs: Temp: 98.1 F (36.7 C) (08/13 0812) Temp Source: Oral (08/13 0351) BP: 115/75 (08/13 0812) Pulse Rate: 71 (08/13 0812)  Labs: Recent Labs    05/30/22 1944 05/30/22 1944 05/30/22 2141 05/31/22 0005 05/31/22 0510 05/31/22 0957 05/31/22 1135 06/01/22 0506 06/01/22 0735 06/01/22 1223  HGB 13.2  --   --   --  12.5  --   --  13.5  --   --   HCT 40.1  --   --   --  37.6  --   --  39.9  --   --   PLT 279  --   --   --  282  --   --  289  --   --   APTT 32  --   --   --   --   --   --   --   --   --   LABPROT 12.7  --   --   --   --   --   --   --   --   --   INR 1.0  --   --   --   --   --   --   --   --   --   HEPARINUNFRC  --    < >  --   --  0.30  --  0.51 0.25*  --  0.59  CREATININE 1.13*  --   --   --  1.01*  --   --   --   --   --   TROPONINIHS 11  --    < > 947*  --  3,031*  --   --  1,175*  --    < > = values in this interval not displayed.     Estimated Creatinine Clearance: 60.2 mL/min (A) (by C-G formula based on SCr of 1.01 mg/dL (H)).   Medical History: Past Medical History:  Diagnosis Date   Asthma    Bilateral low back pain with bilateral sciatica    Bulging lumbar disc    COPD (chronic obstructive pulmonary disease) (HCC)    Coronary artery disease    Diabetes mellitus without complication (HCC)    Fibromyalgia    GERD (gastroesophageal reflux disease)    Hyperlipidemia    Hypertension    Stroke [facility]) 04/2021    Assessment: Pt is a 61 yo female presenting to ED c/o chest pain, found with nSTEMI.  Goal of Therapy:  Heparin level  0.3-0.7 units/ml Monitor platelets by anticoagulation protocol: Yes   8/12 0510 HL 0.3, therapeutic x 1 8/12 1135 HL 0.51  therapeutic x2 8/13 0506 HL 0.25, subtherapeutic 8/13 1233 HL 0.59, therapeutic x1  Plan:  8/13 1233 HL 0.59, therapeutic x1 Continue heparin infusion at 1100 units/hr check confirmatory HL in 6 hr CBC daily while on heparin Plan cardiac cath on 8/14  Chinita Greenland PharmD Clinical Pharmacist 06/01/2022

## 2022-06-01 NOTE — Progress Notes (Signed)
[facility] Encounter Note  Patient: Anne Brennan / Admit Date: 05/30/2022 / Date of Encounter: 06/01/2022, 2:02 PM   Subjective: Patient overall is felt well overnight with no evidence of significant new cardiovascular symptoms chest pain or shortness of breath.  Telemetry continues to show normal sinus rhythm.  Patient remains on heparin without evidence of significant side effects.  Echocardiogram shows normal LV systolic function with no evidence of significant valvular heart disease.  Review of Systems: Positive for: None Negative for: Vision change, hearing change, syncope, dizziness, nausea, vomiting,diarrhea, bloody stool, stomach pain, cough, congestion, diaphoresis, urinary frequency, urinary pain,skin lesions, skin rashes Others previously listed  Objective: Telemetry: Normal sinus rhythm Physical Exam: Blood pressure 115/75, pulse 71, temperature 98.1 F (36.7 C), resp. rate 18, height '5\' 4"'$  (1.626 m), weight 81 kg, SpO2 91 %. Body mass index is 30.64 kg/m. General: Well developed, well nourished, in no acute distress. Head: Normocephalic, atraumatic, sclera non-icteric, no xanthomas, nares are without discharge. Neck: No apparent masses Lungs: Normal respirations with no wheezes, no rhonchi, no rales , no crackles   Heart: Regular rate and rhythm, normal S1 S2, no murmur, no rub, no gallop, PMI is normal size and placement, carotid upstroke normal without bruit, jugular venous pressure normal Abdomen: Soft, non-tender, non-distended with normoactive bowel sounds. No hepatosplenomegaly. Abdominal aorta is normal size without bruit Extremities: No edema, no clubbing, no cyanosis, no ulcers,  Peripheral: 2+ radial, 2+ femoral, 2+ dorsal pedal pulses Neuro: Alert and oriented. Moves all extremities spontaneously. Psych:  Responds to questions appropriately with a normal affect.   Intake/Output Summary (Last 24 hours) at 06/01/2022 1402 Last data  filed at 06/01/2022 0300 Gross per 24 hour  Intake 943.41 ml  Output 575 ml  Net 368.41 ml    Inpatient Medications:   amLODipine  5 mg Oral Q breakfast   aspirin EC  325 mg Oral Daily   buPROPion ER  100 mg Oral BID   canagliflozin  100 mg Oral QAC breakfast   citalopram  20 mg Oral BH-q7a   gabapentin  400 mg Oral QHS   gabapentin  800 mg Oral BID   insulin aspart  0-20 Units Subcutaneous TID AC & HS   insulin aspart protamine- aspart  30 Units Subcutaneous BID WC   isosorbide mononitrate  30 mg Oral Daily   loratadine  10 mg Oral QPM   losartan  50 mg Oral Daily   metoprolol succinate  50 mg Oral Q breakfast   montelukast  10 mg Oral QHS   pantoprazole  40 mg Oral Daily   rosuvastatin  40 mg Oral QPM   traZODone  100 mg Oral QHS   triamterene-hydrochlorothiazide  1 tablet Oral Q breakfast   Infusions:   heparin 1,100 Units/hr (06/01/22 0635)    Labs: Recent Labs    05/30/22 1944 05/31/22 0510  NA 139 138  K 3.0* 3.7  CL 104 105  CO2 23 25  GLUCOSE 431* 303*  BUN 14 16  CREATININE 1.13* 1.01*  CALCIUM 8.5* 9.0   No results for input(s): "AST", "ALT", "ALKPHOS", "BILITOT", "PROT", "ALBUMIN" in the last 72 hours. Recent Labs    05/31/22 0510 06/01/22 0506  WBC 9.7 10.2  HGB 12.5 13.5  HCT 37.6 39.9  MCV 90.2 89.5  PLT 282 289   No results for input(s): "CKTOTAL", "CKMB", "TROPONINI" in the last 72 hours. Invalid input(s): "POCBNP" No results for input(s): "HGBA1C" in the last 72 hours.  Weights: Filed Weights   05/30/22 1940 05/31/22 1609  Weight: 81.6 kg 81 kg     Radiology/Studies:  ECHOCARDIOGRAM COMPLETE  Result Date: 06/01/2022    ECHOCARDIOGRAM REPORT   Patient Name:   Anne Brennan Date of Exam: 05/31/2022 Medical Rec #:  [id number]           Height:       64.0 in Accession #:    [id number]          Weight:       180.0 lb Date of Birth:  July 05, 1961           BSA:          1.871 m Patient Age:    61 years            BP:           121/70  mmHg Patient Gender: F                   HR:           74 bpm. Exam Location:  [facility] Procedure: 2D Echo, Color Doppler and Cardiac Doppler Indications:     I21.4 NSTEMI  History:         Patient has prior history of Echocardiogram examinations, most                  recent 05/20/2021. CAD, COPD; Risk Factors:Hypertension, Diabetes                  and Dyslipidemia.  Sonographer:     Charmayne Sheer Referring Phys:  [id number] [place] Diagnosing Phys: Serafina Royals MD  Sonographer Comments: Image acquisition challenging due to COPD. IMPRESSIONS  1. Left ventricular ejection fraction, by estimation, is 50 to 55%. The left ventricle has low normal function. The left ventricle has no regional wall motion abnormalities. Left ventricular diastolic parameters were normal.  2. Right ventricular systolic function is normal. The right ventricular size is normal.  3. The mitral valve is normal in structure. Mild mitral valve regurgitation.  4. The aortic valve is normal in structure. Aortic valve regurgitation is trivial. FINDINGS  Left Ventricle: Left ventricular ejection fraction, by estimation, is 50 to 55%. The left ventricle has low normal function. The left ventricle has no regional wall motion abnormalities. The left ventricular internal cavity size was normal in size. There is no left ventricular hypertrophy. Left ventricular diastolic parameters were normal. Right Ventricle: The right ventricular size is normal. No increase in right ventricular wall thickness. Right ventricular systolic function is normal. Left Atrium: Left atrial size was normal in size. Right Atrium: Right atrial size was normal in size. Pericardium: There is no evidence of pericardial effusion. Mitral Valve: The mitral valve is normal in structure. Mild mitral valve regurgitation. Tricuspid Valve: The tricuspid valve is normal in structure. Tricuspid valve regurgitation is mild. Aortic Valve: The aortic valve is normal in structure. Aortic valve  regurgitation is trivial. Aortic valve mean gradient measures 3.0 mmHg. Aortic valve peak gradient measures 4.7 mmHg. Aortic valve area, by VTI measures 1.72 cm. Pulmonic Valve: The pulmonic valve was normal in structure. Pulmonic valve regurgitation is trivial. Aorta: The aortic root and ascending aorta are structurally normal, with no evidence of dilitation. IAS/Shunts: No atrial level shunt detected by color flow Doppler.  LEFT VENTRICLE PLAX 2D LVIDd:         4.27 cm   Diastology LVIDs:  3.37 cm   LV e' medial:    4.79 cm/s LV PW:         1.21 cm   LV E/e' medial:  14.2 LV IVS:        0.78 cm   LV e' lateral:   8.70 cm/s LVOT diam:     2.00 cm   LV E/e' lateral: 7.8 LV SV:         36 LV SV Index:   19 LVOT Area:     3.14 cm  RIGHT VENTRICLE RV Basal diam:  2.86 cm LEFT ATRIUM             Index        RIGHT ATRIUM          Index LA diam:        3.40 cm 1.82 cm/m   RA Area:     8.78 cm LA Vol (A2C):   37.6 ml 20.10 ml/m  RA Volume:   16.70 ml 8.93 ml/m LA Vol (A4C):   20.1 ml 10.74 ml/m LA Biplane Vol: 30.0 ml 16.04 ml/m  AORTIC VALVE                    PULMONIC VALVE AV Area (Vmax):    1.82 cm     PV Vmax:       0.78 m/s AV Area (Vmean):   1.83 cm     PV Peak grad:  2.4 mmHg AV Area (VTI):     1.72 cm AV Vmax:           108.00 cm/s AV Vmean:          77.000 cm/s AV VTI:            0.208 m AV Peak Grad:      4.7 mmHg AV Mean Grad:      3.0 mmHg LVOT Vmax:         62.70 cm/s LVOT Vmean:        44.800 cm/s LVOT VTI:          0.114 m LVOT/AV VTI ratio: 0.55  AORTA Ao Root diam: 3.20 cm MITRAL VALVE MV Area (PHT): 4.26 cm    SHUNTS MV Decel Time: 178 msec    Systemic VTI:  0.11 m MV E velocity: 68.10 cm/s  Systemic Diam: 2.00 cm MV A velocity: 90.40 cm/s MV E/A ratio:  0.75 Serafina Royals MD Electronically signed by Serafina Royals MD Signature Date/Time: 06/01/2022/1:26:14 PM    Final    DG Chest 2 View  Result Date: 05/30/2022 CLINICAL DATA:  Right-sided chest pain beginning last night. EXAM:  CHEST - 2 VIEW COMPARISON:  05/02/2022 FINDINGS: The heart size and mediastinal contours are within normal limits. Both lungs are clear. The visualized skeletal structures are unremarkable. IMPRESSION: No active cardiopulmonary disease. Electronically Signed   By: Marlaine Hind M.D.   On: 05/30/2022 20:13     Assessment and Recommendation  61 y.o. female with known coronary artery disease COPD diabetes hypertension hyperlipidemia and acute non-ST elevation myocardial infarction needing further treatment options 1.  Continue current medical regimen and aspirin as well as heparin for further risk reduction of event of myocardial infarction 2.  Proceed to cardiac catheterization to assess coronary anatomy and further treatment was necessary.  Patient understands the risk and benefits of cardiac catheterization.  This includes the possibility of death stroke heart attack infection bleeding or blood clot.  She is at low risk for conscious  sedation. 3.  Further treatment options after above  Signed, Serafina Royals M.D. FACC

## 2022-06-01 NOTE — Progress Notes (Signed)
[place] for heparin infusion Indication: ACS/STEMI  Allergies  Allergen Reactions   Ibuprofen Other (See Comments)    N/V/ salivate   Peach [Prunus Persica] Itching and Swelling    Mouth itches and swells   Penicillins Rash    Patient Measurements: Height: '5\' 4"'$  (162.6 cm) Weight: 81 kg (178 lb 8 oz) IBW/kg (Calculated) : 54.7 Heparin Dosing Weight: 72.4 kg  Vital Signs: Temp: 97.6 F (36.4 C) (08/13 0351) Temp Source: Oral (08/13 0351) BP: 120/74 (08/13 0351) Pulse Rate: 81 (08/13 0351)  Labs: Recent Labs    05/30/22 1944 05/30/22 2141 05/30/22 2237 05/31/22 0005 05/31/22 0510 05/31/22 0957 05/31/22 1135 06/01/22 0506  HGB 13.2  --   --   --  12.5  --   --  13.5  HCT 40.1  --   --   --  37.6  --   --  39.9  PLT 279  --   --   --  282  --   --  289  APTT 32  --   --   --   --   --   --   --   LABPROT 12.7  --   --   --   --   --   --   --   INR 1.0  --   --   --   --   --   --   --   HEPARINUNFRC  --   --   --   --  0.30  --  0.51 0.25*  CREATININE 1.13*  --   --   --  1.01*  --   --   --   TROPONINIHS 11   < > 538* 947*  --  3,031*  --   --    < > = values in this interval not displayed.     Estimated Creatinine Clearance: 60.2 mL/min (A) (by C-G formula based on SCr of 1.01 mg/dL (H)).   Medical History: Past Medical History:  Diagnosis Date   Asthma    Bilateral low back pain with bilateral sciatica    Bulging lumbar disc    COPD (chronic obstructive pulmonary disease) (HCC)    Coronary artery disease    Diabetes mellitus without complication (HCC)    Fibromyalgia    GERD (gastroesophageal reflux disease)    Hyperlipidemia    Hypertension    Stroke [facility]) 04/2021    Assessment: Pt is a 61 yo female presenting to ED c/o chest pain, found with nSTEMI.  Goal of Therapy:  Heparin level 0.3-0.7 units/ml Monitor platelets by anticoagulation protocol: Yes   8/12 0510 HL 0.3, therapeutic x 1 8/12 1135 HL  0.51  therapeutic x2 8/13 0506 HL 0.25, subtherapeutic  Plan:  B[place] 1100 units x 1 Increase heparin infusion to 1100 units/hr Recheck HL in 6 hr after rate change CBC daily while on heparin Plan cardiac cath on 8/14  Renda Rolls, PharmD, [facility] 06/01/2022 5:57 AM

## 2022-06-02 ENCOUNTER — Encounter
Admission: EM | Disposition: A | Discharge status: Other Acute Inpatient Hospital | Payer: Self-pay | Source: Home / Self Care | Attending: Internal Medicine

## 2022-06-02 ENCOUNTER — Encounter: Payer: Self-pay | Admitting: Internal Medicine

## 2022-06-02 HISTORY — PX: LEFT HEART CATH AND CORONARY ANGIOGRAPHY: CATH118249

## 2022-06-02 LAB — CBC WITH DIFFERENTIAL/PLATELET
Abs Immature Granulocytes: 0.02 10*3/uL (ref 0.00–0.07)
Basophils Absolute: 0.1 10*3/uL (ref 0.0–0.1)
Basophils Relative: 1 %
Eosinophils Absolute: 0.2 10*3/uL (ref 0.0–0.5)
Eosinophils Relative: 2 %
HCT: 39.2 % (ref 36.0–46.0)
Hemoglobin: 13.5 g/dL (ref 12.0–15.0)
Immature Granulocytes: 0 %
Lymphocytes Relative: 63 %
Lymphs Abs: 4.9 10*3/uL — ABNORMAL HIGH (ref 0.7–4.0)
MCH: 31 pg (ref 26.0–34.0)
MCHC: 34.4 g/dL (ref 30.0–36.0)
MCV: 90.1 fL (ref 80.0–100.0)
Monocytes Absolute: 0.6 10*3/uL (ref 0.1–1.0)
Monocytes Relative: 7 %
Neutro Abs: 2.1 10*3/uL (ref 1.7–7.7)
Neutrophils Relative %: 27 %
Platelets: 273 10*3/uL (ref 150–400)
RBC: 4.35 MIL/uL (ref 3.87–5.11)
RDW: 13.1 % (ref 11.5–15.5)
WBC: 7.8 10*3/uL (ref 4.0–10.5)
nRBC: 0 % (ref 0.0–0.2)

## 2022-06-02 LAB — HEPARIN LEVEL (UNFRACTIONATED): Heparin Unfractionated: 0.51 IU/mL (ref 0.30–0.70)

## 2022-06-02 LAB — BASIC METABOLIC PANEL
Anion gap: 8 (ref 5–15)
BUN: 26 mg/dL — ABNORMAL HIGH (ref 8–23)
CO2: 27 mmol/L (ref 22–32)
Calcium: 9 mg/dL (ref 8.9–10.3)
Chloride: 103 mmol/L (ref 98–111)
Creatinine, Ser: 1.35 mg/dL — ABNORMAL HIGH (ref 0.44–1.00)
GFR, Estimated: 45 mL/min — ABNORMAL LOW (ref >60)
Glucose, Bld: 236 mg/dL — ABNORMAL HIGH (ref 70–99)
Potassium: 3.4 mmol/L — ABNORMAL LOW (ref 3.5–5.1)
Sodium: 138 mmol/L (ref 135–145)

## 2022-06-02 LAB — GLUCOSE, CAPILLARY
Glucose-Capillary: 235 mg/dL — ABNORMAL HIGH (ref 70–99)
Glucose-Capillary: 221 mg/dL — ABNORMAL HIGH (ref 70–99)
Glucose-Capillary: 203 mg/dL — ABNORMAL HIGH (ref 70–99)

## 2022-06-02 LAB — PROTIME-INR
INR: 1 (ref 0.8–1.2)
Prothrombin Time: 13.5 seconds (ref 11.4–15.2)

## 2022-06-02 SURGERY — LEFT HEART CATH AND CORONARY ANGIOGRAPHY
Anesthesia: Moderate Sedation

## 2022-06-02 MED ORDER — HYDRALAZINE HCL 20 MG/ML IJ SOLN: 10.0000 mg | INTRAMUSCULAR | Status: AC | PRN

## 2022-06-02 MED ORDER — SODIUM CHLORIDE 0.9 % IV SOLN: 250.0000 mL | INTRAVENOUS | Status: DC | PRN

## 2022-06-02 MED ORDER — VERAPAMIL HCL 2.5 MG/ML IV SOLN
INTRAVENOUS | Status: AC
  Filled 2022-06-02: qty 2

## 2022-06-02 MED ORDER — HEPARIN SODIUM (PORCINE) 1000 UNIT/ML IJ SOLN
INTRAMUSCULAR | Status: DC | PRN
  Administered 2022-06-02: 4000 [IU] via INTRAVENOUS

## 2022-06-02 MED ORDER — FENTANYL CITRATE (PF) 100 MCG/2ML IJ SOLN
INTRAMUSCULAR | Status: DC | PRN
Start: 2022-06-02 — End: 2022-06-02
  Administered 2022-06-02: 25 ug via INTRAVENOUS

## 2022-06-02 MED ORDER — MIDAZOLAM HCL 2 MG/2ML IJ SOLN
INTRAMUSCULAR | Status: DC | PRN
  Administered 2022-06-02: 1 mg via INTRAVENOUS

## 2022-06-02 MED ORDER — HEPARIN (PORCINE) IN NACL 2000-0.9 UNIT/L-% IV SOLN
INTRAVENOUS | Status: DC | PRN
  Administered 2022-06-02: 1000 mL

## 2022-06-02 MED ORDER — IOHEXOL 300 MG/ML  SOLN
INTRAMUSCULAR | Status: DC | PRN
  Administered 2022-06-02: 105 mL

## 2022-06-02 MED ORDER — SODIUM CHLORIDE 0.9 % WEIGHT BASED INFUSION: 1.0000 mL/kg/h | INTRAVENOUS | Status: DC

## 2022-06-02 MED ORDER — LOSARTAN POTASSIUM 50 MG PO TABS: 50.0000 mg | ORAL_TABLET | Freq: Every day | ORAL | Status: AC

## 2022-06-02 MED ORDER — SODIUM CHLORIDE 0.9% FLUSH: 3.0000 mL | INTRAVENOUS | Status: DC | PRN

## 2022-06-02 MED ORDER — SODIUM CHLORIDE 0.9 % WEIGHT BASED INFUSION
3.0000 mL/kg/h | INTRAVENOUS | Status: DC
  Administered 2022-06-02: 3 mL/kg/h via INTRAVENOUS

## 2022-06-02 MED ORDER — LIDOCAINE HCL 1 % IJ SOLN
INTRAMUSCULAR | Status: AC
  Filled 2022-06-02: qty 20

## 2022-06-02 MED ORDER — FENTANYL CITRATE (PF) 100 MCG/2ML IJ SOLN
INTRAMUSCULAR | Status: AC
  Filled 2022-06-02: qty 2

## 2022-06-02 MED ORDER — ACETAMINOPHEN 325 MG PO TABS: 650.0000 mg | ORAL_TABLET | ORAL | Status: DC | PRN

## 2022-06-02 MED ORDER — ONDANSETRON HCL 4 MG/2ML IJ SOLN: 4.0000 mg | Freq: Four times a day (QID) | INTRAMUSCULAR | Status: DC | PRN

## 2022-06-02 MED ORDER — VERAPAMIL HCL 2.5 MG/ML IV SOLN
INTRAVENOUS | Status: DC | PRN
  Administered 2022-06-02: 2.5 mg via INTRA_ARTERIAL

## 2022-06-02 MED ORDER — ASPIRIN 81 MG PO CHEW
81.0000 mg | CHEWABLE_TABLET | ORAL | Status: AC
  Administered 2022-06-02: 81 mg via ORAL

## 2022-06-02 MED ORDER — HEPARIN SODIUM (PORCINE) 5000 UNIT/ML IJ SOLN
5000.0000 [IU] | Freq: Three times a day (TID) | INTRAMUSCULAR | Status: DC
  Administered 2022-06-02: 5000 [IU] via SUBCUTANEOUS
  Filled 2022-06-02: qty 1

## 2022-06-02 MED ORDER — HEPARIN SODIUM (PORCINE) 1000 UNIT/ML IJ SOLN
INTRAMUSCULAR | Status: AC
  Filled 2022-06-02: qty 10

## 2022-06-02 MED ORDER — MIDAZOLAM HCL 2 MG/2ML IJ SOLN
INTRAMUSCULAR | Status: AC
  Filled 2022-06-02: qty 2

## 2022-06-02 MED ORDER — ASPIRIN 81 MG PO CHEW
CHEWABLE_TABLET | ORAL | Status: AC
  Filled 2022-06-02: qty 1

## 2022-06-02 MED ORDER — HEPARIN (PORCINE) IN NACL 1000-0.9 UT/500ML-% IV SOLN
INTRAVENOUS | Status: AC
  Filled 2022-06-02: qty 1000

## 2022-06-02 MED ORDER — LABETALOL HCL 5 MG/ML IV SOLN: 10.0000 mg | INTRAVENOUS | Status: AC | PRN

## 2022-06-02 SURGICAL SUPPLY — 12 items
BAND ZEPHYR COMPRESS 30 LONG (HEMOSTASIS) ×1 IMPLANT
CATH 5FR JL3.5 JR4 ANG PIG MP (CATHETERS) ×1 IMPLANT
DRAPE BRACHIAL (DRAPES) ×1 IMPLANT
GLIDESHEATH SLEND SS 6F .021 (SHEATH) ×1 IMPLANT
GUIDEWIRE INQWIRE 1.5J.035X260 (WIRE) IMPLANT
INQWIRE 1.5J .035X260CM (WIRE) ×2
KIT SYRINGE INJ CVI SPIKEX1 (MISCELLANEOUS) ×1 IMPLANT
PACK CARDIAC CATH (CUSTOM PROCEDURE TRAY) ×2 IMPLANT
PROTECTION STATION PRESSURIZED (MISCELLANEOUS) ×2
SET ATX SIMPLICITY (MISCELLANEOUS) ×1 IMPLANT
STATION PROTECTION PRESSURIZED (MISCELLANEOUS) IMPLANT
WIRE HITORQ VERSACORE ST 145CM (WIRE) ×1 IMPLANT

## 2022-06-02 NOTE — Progress Notes (Signed)
PROGRESS NOTE    Anne Brennan  DJS:[id number] DOB: 1961-08-13 DOA: 05/30/2022 PCP: Idelle Crouch, MD    Brief Narrative:  61 y.o. African-American female with medical history significant for asthma, COPD, coronary artery disease, type 2 diabetes mellitus, fibromyalgia, GERD, hypertension, dyslipidemia and CVA, presented to the emergency room with acute onset of intermittent substernal chest pain felt like pressure graded 10/10 in severity and worsening with exertion with associated dyspnea for the last couple weeks.  She denied any associated nausea or vomiting or diaphoresis.  She stated she felt hot though.  She denies any dyspnea or palpitations.  No leg pain or edema or recent travels or surgeries.  She has been having cough with clear sputum production without wheezing.  No dysuria, oliguria or hematuria or flank pain.  No bleeding diathesis.  No fever or chills.  No bleeding diathesis.  Uptrending troponins consistent with NSTEMI.  Cardiology engaged.  On heparin gtt.  Plan for cardiac catheterization Monday 8/14.  Status post cardiac catheterization 8/14.  Severe three-vessel CAD by cardiac catheterization, normal LV systolic function.  Per note cardiology recommending transfer for CABG evaluation.   Assessment & Plan:   Principal Problem:   NSTEMI (non-ST elevated myocardial infarction) ([place]) Active Problems:   Essential hypertension   Uncontrolled type 2 diabetes mellitus with hyperglycemia, with long-term current use of insulin (HCC)   Chronic obstructive pulmonary disease (COPD) (HCC)   Depression   Dyslipidemia  NSTEMI Chest pain Severe three-vessel coronary artery disease Troponin peaked at 3000, now downtrending No chest pain Cardiology on board, [facility] clinic Post catheterization with three-vessel disease Plan: Continue aspirin, high intensity statin, beta-blocker Long-acting nitrates Okay for diet Cardiology to arrange for transfer to facility with  CABG capability  Type 2 diabetes mellitus uncontrolled with hyperglycemia Continue basal bolus coverage Continue holding metformin Continue home Invokana Carb modified diet  Essential hypertension PTA beta-blocker PTA ARB  COPD Not acutely exacerbated PTA Singulair As needed bronchodilators  Depression PTA Celexa and Wellbutrin  Hyperlipidemia High intensity statin   DVT prophylaxis: SQ heparin Code Status: Full Family Communication: None today.  Offered to call but patient declined Disposition Plan: Status is: Inpatient Remains inpatient appropriate because: NSTEMI, severe CAD.  Would likely need transfer     Level of care: Telemetry Cardiac  Consultants:  Cardiology-[facility]  Procedures:  Left heart catheterization 8/14  Antimicrobials: None   Subjective: Seen and examined.  Return from Cath Lab.  No chest pain  Objective: Vitals:   06/02/22 0901 06/02/22 0915 06/02/22 0931 06/02/22 1000  BP: 104/64 96/64 (!) 112/49 112/75  Pulse: 68 64 66 65  Resp: '13 12 18 12  '$ Temp:      TempSrc:      SpO2: 91% 91% 92% 96%  Weight:      Height:        Intake/Output Summary (Last 24 hours) at 06/02/2022 1132 Last data filed at 06/01/2022 1946 Gross per 24 hour  Intake --  Output 700 ml  Net -700 ml   Filed Weights   05/31/22 1609 06/02/22 0700 06/02/22 0711  Weight: 81 kg 81.9 kg 81.8 kg    Examination:  General exam: NAD Respiratory system: Lungs clear.  Normal work of breathing.  Room air Cardiovascular system: S1-S2, RRR, no murmurs, no pedal edema Gastrointestinal system: Soft, NT/ND, normal bowel sounds Central nervous system: Alert and oriented. No focal neurological deficits. Extremities: Symmetric 5 x 5 power. Skin: No rashes, lesions or ulcers Psychiatry: Judgement and  insight appear normal. Mood & affect appropriate.     Data Reviewed: I have personally reviewed following labs and imaging studies  CBC: Recent Labs  Lab  05/30/22 1944 05/31/22 0510 06/01/22 0506 06/02/22 0518  WBC 6.6 9.7 10.2 7.8  NEUTROABS  --   --   --  2.1  HGB 13.2 12.5 13.5 13.5  HCT 40.1 37.6 39.9 39.2  MCV 91.6 90.2 89.5 90.1  PLT 279 282 289 825   Basic Metabolic Panel: Recent Labs  Lab 05/30/22 1944 05/31/22 0510 06/02/22 0518  NA 139 138 138  K 3.0* 3.7 3.4*  CL 104 105 103  CO2 '23 25 27  '$ GLUCOSE 431* 303* 236*  BUN 14 16 26*  CREATININE 1.13* 1.01* 1.35*  CALCIUM 8.5* 9.0 9.0   GFR: Estimated Creatinine Clearance: 41.5 mL/min (A) (by C-G formula based on SCr of 1.35 mg/dL (H)). Liver Function Tests: No results for input(s): "AST", "ALT", "ALKPHOS", "BILITOT", "PROT", "ALBUMIN" in the last 168 hours. No results for input(s): "LIPASE", "AMYLASE" in the last 168 hours. No results for input(s): "AMMONIA" in the last 168 hours. Coagulation Profile: Recent Labs  Lab 05/30/22 1944 06/02/22 0518  INR 1.0 1.0   Cardiac Enzymes: No results for input(s): "CKTOTAL", "CKMB", "CKMBINDEX", "TROPONINI" in the last 168 hours. BNP (last 3 results) No results for input(s): "PROBNP" in the last 8760 hours. HbA1C: No results for input(s): "HGBA1C" in the last 72 hours. CBG: Recent Labs  Lab 06/01/22 1233 06/01/22 1600 06/01/22 2041 06/02/22 0728 06/02/22 0859  GLUCAP 176* 274* 272* 235* 221*   Lipid Profile: No results for input(s): "CHOL", "HDL", "LDLCALC", "TRIG", "CHOLHDL", "LDLDIRECT" in the last 72 hours. Thyroid Function Tests: No results for input(s): "TSH", "T4TOTAL", "FREET4", "T3FREE", "THYROIDAB" in the last 72 hours. Anemia Panel: No results for input(s): "VITAMINB12", "FOLATE", "FERRITIN", "TIBC", "IRON", "RETICCTPCT" in the last 72 hours. Sepsis Labs: No results for input(s): "PROCALCITON", "LATICACIDVEN" in the last 168 hours.  No results found for this or any previous visit (from the past 240 hour(s)).       Radiology Studies: CARDIAC CATHETERIZATION  Result Date: 06/02/2022   Prox RCA  lesion is 100% stenosed.   Dist RCA lesion is 100% stenosed.   Ost Cx to Prox Cx lesion is 95% stenosed.   Mid LM lesion is 75% stenosed.   Dist LM to Ost LAD lesion is 75% stenosed.   Prox LAD lesion is 40% stenosed with 90% stenosed side branch in 1st Sept.   The left ventricular systolic function is normal.   LV end diastolic pressure is mildly elevated.   The left ventricular ejection fraction is 50-55% by visual estimate. 61 year old female with hypertension hyperlipidemia coronary artery disease and diabetes with acute non-ST elevation myocardial infarction. Left ventricle with normal LV systolic function with ejection fraction of 55% by echocardiogram Mild elevation of end-diastolic pressure Severe three-vessel coronary artery disease with significant left main LAD stenosis of 90%, 95% ostial circumflex artery stenosis, occluded proximal right coronary artery with collaterals to distal PDA Plan Discussion with cardiovascular team for coronary artery bypass grafting High intensity cholesterol therapy Single antiplatelet therapy including aspirin 81 mg Beta-blocker ACE inhibitor for hypertension control Diabetes control with canagliflozin Cardiac rehab after above   ECHOCARDIOGRAM COMPLETE  Result Date: 06/01/2022    ECHOCARDIOGRAM REPORT   Patient Name:   Anne Brennan Date of Exam: 05/31/2022 Medical Rec #:  [id number]           Height:  64.0 in Accession #:    [id number]          Weight:       180.0 lb Date of Birth:  11/25/1960           BSA:          1.871 m Patient Age:    40 years            BP:           121/70 mmHg Patient Gender: F                   HR:           74 bpm. Exam Location:  [facility] Procedure: 2D Echo, Color Doppler and Cardiac Doppler Indications:     I21.4 NSTEMI  History:         Patient has prior history of Echocardiogram examinations, most                  recent 05/20/2021. CAD, COPD; Risk Factors:Hypertension, Diabetes                  and Dyslipidemia.  Sonographer:      Charmayne Sheer Referring Phys:  [id number] Gordon Diagnosing Phys: Serafina Royals MD  Sonographer Comments: Image acquisition challenging due to COPD. IMPRESSIONS  1. Left ventricular ejection fraction, by estimation, is 50 to 55%. The left ventricle has low normal function. The left ventricle has no regional wall motion abnormalities. Left ventricular diastolic parameters were normal.  2. Right ventricular systolic function is normal. The right ventricular size is normal.  3. The mitral valve is normal in structure. Mild mitral valve regurgitation.  4. The aortic valve is normal in structure. Aortic valve regurgitation is trivial. FINDINGS  Left Ventricle: Left ventricular ejection fraction, by estimation, is 50 to 55%. The left ventricle has low normal function. The left ventricle has no regional wall motion abnormalities. The left ventricular internal cavity size was normal in size. There is no left ventricular hypertrophy. Left ventricular diastolic parameters were normal. Right Ventricle: The right ventricular size is normal. No increase in right ventricular wall thickness. Right ventricular systolic function is normal. Left Atrium: Left atrial size was normal in size. Right Atrium: Right atrial size was normal in size. Pericardium: There is no evidence of pericardial effusion. Mitral Valve: The mitral valve is normal in structure. Mild mitral valve regurgitation. Tricuspid Valve: The tricuspid valve is normal in structure. Tricuspid valve regurgitation is mild. Aortic Valve: The aortic valve is normal in structure. Aortic valve regurgitation is trivial. Aortic valve mean gradient measures 3.0 mmHg. Aortic valve peak gradient measures 4.7 mmHg. Aortic valve area, by VTI measures 1.72 cm. Pulmonic Valve: The pulmonic valve was normal in structure. Pulmonic valve regurgitation is trivial. Aorta: The aortic root and ascending aorta are structurally normal, with no evidence of dilitation. IAS/Shunts: No atrial level  shunt detected by color flow Doppler.  LEFT VENTRICLE PLAX 2D LVIDd:         4.27 cm   Diastology LVIDs:         3.37 cm   LV e' medial:    4.79 cm/s LV PW:         1.21 cm   LV E/e' medial:  14.2 LV IVS:        0.78 cm   LV e' lateral:   8.70 cm/s LVOT diam:     2.00 cm   LV E/e' lateral: 7.8 LV  SV:         36 LV SV Index:   19 LVOT Area:     3.14 cm  RIGHT VENTRICLE RV Basal diam:  2.86 cm LEFT ATRIUM             Index        RIGHT ATRIUM          Index LA diam:        3.40 cm 1.82 cm/m   RA Area:     8.78 cm LA Vol (A2C):   37.6 ml 20.10 ml/m  RA Volume:   16.70 ml 8.93 ml/m LA Vol (A4C):   20.1 ml 10.74 ml/m LA Biplane Vol: 30.0 ml 16.04 ml/m  AORTIC VALVE                    PULMONIC VALVE AV Area (Vmax):    1.82 cm     PV Vmax:       0.78 m/s AV Area (Vmean):   1.83 cm     PV Peak grad:  2.4 mmHg AV Area (VTI):     1.72 cm AV Vmax:           108.00 cm/s AV Vmean:          77.000 cm/s AV VTI:            0.208 m AV Peak Grad:      4.7 mmHg AV Mean Grad:      3.0 mmHg LVOT Vmax:         62.70 cm/s LVOT Vmean:        44.800 cm/s LVOT VTI:          0.114 m LVOT/AV VTI ratio: 0.55  AORTA Ao Root diam: 3.20 cm MITRAL VALVE MV Area (PHT): 4.26 cm    SHUNTS MV Decel Time: 178 msec    Systemic VTI:  0.11 m MV E velocity: 68.10 cm/s  Systemic Diam: 2.00 cm MV A velocity: 90.40 cm/s MV E/A ratio:  0.75 Serafina Royals MD Electronically signed by Serafina Royals MD Signature Date/Time: 06/01/2022/1:26:14 PM    Final         Scheduled Meds:  amLODipine  5 mg Oral Q breakfast   aspirin       aspirin EC  325 mg Oral Daily   buPROPion ER  100 mg Oral BID   canagliflozin  100 mg Oral QAC breakfast   citalopram  20 mg Oral BH-q7a   gabapentin  400 mg Oral QHS   gabapentin  800 mg Oral BID   insulin aspart  0-20 Units Subcutaneous TID AC & HS   insulin aspart protamine- aspart  30 Units Subcutaneous BID WC   isosorbide mononitrate  30 mg Oral Daily   loratadine  10 mg Oral QPM   losartan  50 mg Oral  Daily   metoprolol succinate  50 mg Oral Q breakfast   montelukast  10 mg Oral QHS   pantoprazole  40 mg Oral Daily   rosuvastatin  40 mg Oral QPM   sodium chloride flush  3 mL Intravenous Q12H   traZODone  100 mg Oral QHS   triamterene-hydrochlorothiazide  1 tablet Oral Q breakfast   Continuous Infusions:  sodium chloride 1 mL/kg/hr (06/02/22 0852)     LOS: 2 days     Sidney Ace, MD [facility]   If 7PM-7AM, please contact night-coverage  06/02/2022, 11:32 AM

## 2022-06-02 NOTE — Progress Notes (Signed)
ANTICOAGULATION CONSULT NOTE  Pharmacy Consult for heparin infusion Indication: ACS/STEMI  Allergies  Allergen Reactions   Ibuprofen Other (See Comments)    N/V/ salivate   Peach [Prunus Persica] Itching and Swelling    Mouth itches and swells   Penicillins Rash    Patient Measurements: Height: '5\' 4"'$  (162.6 cm) Weight: 81 kg (178 lb 8 oz) IBW/kg (Calculated) : 54.7 Heparin Dosing Weight: 72.4 kg  Vital Signs: Temp: 97.6 F (36.4 C) (08/14 0356) BP: 105/71 (08/14 0356) Pulse Rate: 62 (08/14 0356)  Labs: Recent Labs    05/30/22 1944 05/30/22 2141 05/31/22 0005 05/31/22 0510 05/31/22 0957 05/31/22 1135 06/01/22 0506 06/01/22 0735 06/01/22 1223 06/01/22 1830 06/02/22 0518  HGB 13.2  --   --  12.5  --   --  13.5  --   --   --  13.5  HCT 40.1  --   --  37.6  --   --  39.9  --   --   --  39.2  PLT 279  --   --  282  --   --  289  --   --   --  273  APTT 32  --   --   --   --   --   --   --   --   --   --   LABPROT 12.7  --   --   --   --   --   --   --   --   --  13.5  INR 1.0  --   --   --   --   --   --   --   --   --  1.0  HEPARINUNFRC  --   --   --  0.30  --    < > 0.25*  --  0.59 0.51 0.51  CREATININE 1.13*  --   --  1.01*  --   --   --   --   --   --   --   TROPONINIHS 11   < > 947*  --  3,031*  --   --  1,175*  --   --   --    < > = values in this interval not displayed.     Estimated Creatinine Clearance: 60.2 mL/min (A) (by C-G formula based on SCr of 1.01 mg/dL (H)).   Medical History: Past Medical History:  Diagnosis Date   Asthma    Bilateral low back pain with bilateral sciatica    Bulging lumbar disc    COPD (chronic obstructive pulmonary disease) (HCC)    Coronary artery disease    Diabetes mellitus without complication (HCC)    Fibromyalgia    GERD (gastroesophageal reflux disease)    Hyperlipidemia    Hypertension    Stroke [facility]) 04/2021    Assessment: Pt is a 61 yo female presenting to ED c/o chest pain, found with nSTEMI.  Goal of  Therapy:  Heparin level 0.3-0.7 units/ml Monitor platelets by anticoagulation protocol: Yes   8/12 0510 HL 0.3, therapeutic x 1 8/12 1135 HL 0.51  therapeutic x2 8/13 0506 HL 0.25, subtherapeutic 8/13 1233 HL 0.59, therapeutic x1 8/13 1830 HL 0.51, therapeutic x2 8/14 0518 HL 0.51, therapeutic x 3  Plan:  8/13 1830 HL 0.51, therapeutic x2 Continue heparin infusion at 1100 units/hr check HL daily with am labs CBC daily while on heparin Plan cardiac cath on 8/14  Renda Rolls, PharmD,  [facility] 06/02/2022 6:43 AM

## 2022-06-02 NOTE — Discharge Summary (Signed)
Physician Discharge Summary  Anne Brennan MHD:[id number] DOB: [birth year] DOA: 05/30/2022  PCP: Idelle Crouch, MD  Admit date: 05/30/2022 Discharge date: 06/02/2022  Admitted From: Home Disposition:  [place]: N/A Equipment/Devices: None  Discharge Condition: Stable CODE STATUS: Full Diet recommendation: Heart healthy/carb modified  Brief/Interim Summary:  61 y.o. African-American female with medical history significant for asthma, COPD, coronary artery disease, type 2 diabetes mellitus, fibromyalgia, GERD, hypertension, dyslipidemia and CVA, presented to the emergency room with acute onset of intermittent substernal chest pain felt like pressure graded 10/10 in severity and worsening with exertion with associated dyspnea for the last couple weeks.  She denied any associated nausea or vomiting or diaphoresis.  She stated she felt hot though.  She denies any dyspnea or palpitations.  No leg pain or edema or recent travels or surgeries.  She has been having cough with clear sputum production without wheezing.  No dysuria, oliguria or hematuria or flank pain.  No bleeding diathesis.  No fever or chills.  No bleeding diathesis.   Uptrending troponins consistent with NSTEMI.  Cardiology engaged.  On heparin gtt.  Plan for cardiac catheterization Monday 8/14.   Status post cardiac catheterization 8/14.  Severe three-vessel CAD by cardiac catheterization, normal LV systolic function.  Per note cardiology recommending transfer for CABG evaluation.   Discharge Diagnoses:  Principal Problem:   NSTEMI (non-ST elevated myocardial infarction) ([place]) Active Problems:   Essential hypertension   Uncontrolled type 2 diabetes mellitus with hyperglycemia, with long-term current use of insulin (HCC)   Chronic obstructive pulmonary disease (COPD) (HCC)   Depression   Dyslipidemia  NSTEMI Chest pain Severe three-vessel coronary artery disease Troponin peaked at  3000, now downtrending No chest pain Cardiology on board, [facility] Post catheterization with three-vessel disease Plan: Continue aspirin, high intensity statin, beta-blocker Long-acting nitrates Okay for diet Cardiology to arrange for transfer to facility with CABG capability Will transfer to [facility] in stable condition   Type 2 diabetes mellitus uncontrolled with hyperglycemia Resume home regimen on discharge medication reconciliation.  Defer insulin regimen to accepting physician at [facility].   Essential hypertension PTA beta-blocker PTA ARB   COPD Not acutely exacerbated PTA Singulair As needed bronchodilators   Depression PTA Celexa and Wellbutrin   Hyperlipidemia High intensity statin  Discharge Instructions  Discharge Instructions     AMB Referral to Cardiac Rehabilitation - Phase II   Complete by: As directed    Diagnosis: NSTEMI   Diet - low sodium heart healthy   Complete by: As directed    Increase activity slowly   Complete by: As directed       Allergies as of 06/02/2022       Reactions   Ibuprofen Other (See Comments)   N/V/ salivate   Peach [prunus Persica] Itching, Swelling   Mouth itches and swells   Penicillins Rash        Medication List     STOP taking these medications    atorvastatin 40 MG tablet Commonly known as: LIPITOR   nicotine 21 mg/24hr patch Commonly known as: NICODERM CQ - dosed in mg/24 hours   nitroGLYCERIN 0.4 MG SL tablet Commonly known as: NITROSTAT   ondansetron 4 MG disintegrating tablet Commonly known as: ZOFRAN-ODT   pravastatin 20 MG tablet Commonly known as: PRAVACHOL   SYMBICORT IN       TAKE these medications    albuterol 108 (90 Base) MCG/ACT inhaler Commonly known as: VENTOLIN  HFA Inhale 2 puffs into the lungs every 4 (four) hours as needed for shortness of breath or wheezing.   amLODipine 5 MG tablet Commonly known as: NORVASC Take 5 mg by mouth every morning.    aspirin EC 325 MG tablet Take 1 tablet (325 mg total) by mouth daily.   Azelastine HCl 137 MCG/SPRAY Soln SMARTSIG:1-2 Spray(s) Both Nares Twice Daily   benzonatate 200 MG capsule Commonly known as: TESSALON Take 200 mg by mouth 3 (three) times daily as needed.   buPROPion ER 100 MG 12 hr tablet Commonly known as: WELLBUTRIN SR Take 100 mg by mouth 2 (two) times daily.   canagliflozin 100 MG Tabs tablet Commonly known as: INVOKANA Take by mouth daily before breakfast.   citalopram 20 MG tablet Commonly known as: CELEXA Take 20 mg by mouth every morning.   cyclobenzaprine 10 MG tablet Commonly known as: FLEXERIL Take 10 mg by mouth 3 (three) times daily.   fluticasone 50 MCG/ACT nasal spray Commonly known as: FLONASE Place 1 spray into both nostrils at bedtime.   gabapentin 400 MG capsule Commonly known as: NEURONTIN Take 800 mg by mouth 3 (three) times daily as needed.   HumuLIN N KwikPen 100 UNIT/ML Kiwkpen Generic drug: Insulin NPH (Human) (Isophane) Inject 40 Units into the skin in the morning and at bedtime.   HYDROcodone-acetaminophen 10-325 MG tablet Commonly known as: NORCO Take 1 tablet by mouth 4 (four) times daily as needed.   isosorbide mononitrate 30 MG 24 hr tablet Commonly known as: IMDUR Take 1 tablet (30 mg total) by mouth daily.   levocetirizine 5 MG tablet Commonly known as: XYZAL Take 5 mg by mouth every evening.   liraglutide 18 MG/3ML Sopn Commonly known as: VICTOZA Inject 1.8 mg into the skin every morning. What changed: Another medication with the same name was removed. Continue taking this medication, and follow the directions you see here.   lisinopril 10 MG tablet Commonly known as: ZESTRIL Take 10 mg by mouth daily.   losartan 50 MG tablet Commonly known as: COZAAR Take 1 tablet (50 mg total) by mouth daily.   metFORMIN 500 MG 24 hr tablet Commonly known as: GLUCOPHAGE-XR Take 1,000 mg by mouth daily.   metoprolol  succinate 50 MG 24 hr tablet Commonly known as: TOPROL-XL Take 50 mg by mouth every morning.   montelukast 10 MG tablet Commonly known as: SINGULAIR Take 10 mg by mouth at bedtime.   NovoLIN 70/30 (70-30) 100 UNIT/ML injection Generic drug: insulin NPH-regular Human Inject 44 Units into the skin 2 (two) times daily.   omeprazole 20 MG capsule Commonly known as: PRILOSEC Take 20 mg by mouth 2 (two) times daily.   predniSONE 10 MG tablet Commonly known as: DELTASONE Take 10 mg by mouth as directed. Taper   rosuvastatin 40 MG tablet Commonly known as: CRESTOR Take 40 mg by mouth every evening.   traZODone 100 MG tablet Commonly known as: DESYREL Take 100 mg by mouth at bedtime.   triamterene-hydrochlorothiazide 37.5-25 MG tablet Commonly known as: MAXZIDE-25 Take 1 tablet by mouth every morning.        Allergies  Allergen Reactions   Ibuprofen Other (See Comments)    N/V/ salivate   Peach [Prunus Persica] Itching and Swelling    Mouth itches and swells   Penicillins Rash    Consultations: Cardiology-[facility]   Procedures/Studies: CARDIAC CATHETERIZATION  Result Date: 06/02/2022   Prox RCA lesion is 100% stenosed.   Dist RCA lesion is  100% stenosed.   Ost Cx to Prox Cx lesion is 95% stenosed.   Mid LM lesion is 75% stenosed.   Dist LM to Ost LAD lesion is 75% stenosed.   Prox LAD lesion is 40% stenosed with 90% stenosed side branch in 1st Sept.   The left ventricular systolic function is normal.   LV end diastolic pressure is mildly elevated.   The left ventricular ejection fraction is 50-55% by visual estimate. 61 year old female with hypertension hyperlipidemia coronary artery disease and diabetes with acute non-ST elevation myocardial infarction. Left ventricle with normal LV systolic function with ejection fraction of 55% by echocardiogram Mild elevation of end-diastolic pressure Severe three-vessel coronary artery disease with significant left main LAD  stenosis of 90%, 95% ostial circumflex artery stenosis, occluded proximal right coronary artery with collaterals to distal PDA Plan Discussion with cardiovascular team for coronary artery bypass grafting High intensity cholesterol therapy Single antiplatelet therapy including aspirin 81 mg Beta-blocker ACE inhibitor for hypertension control Diabetes control with canagliflozin Cardiac rehab after above   ECHOCARDIOGRAM COMPLETE  Result Date: 06/01/2022    ECHOCARDIOGRAM REPORT   Patient Name:   Anne Brennan Date of Exam: 05/31/2022 Medical Rec #:  [id number]           Height:       64.0 in Accession #:    [id number]          Weight:       180.0 lb Date of Birth:  [birth year]           BSA:          1.871 m Patient Age:    6 years            BP:           121/70 mmHg Patient Gender: F                   HR:           74 bpm. Exam Location:  [facility] Procedure: 2D Echo, Color Doppler and Cardiac Doppler Indications:     I21.4 NSTEMI  History:         Patient has prior history of Echocardiogram examinations, most                  recent 05/20/2021. CAD, COPD; Risk Factors:Hypertension, Diabetes                  and Dyslipidemia.  Sonographer:     Charmayne Sheer Referring Phys:  [id number] [place] Diagnosing Phys: Serafina Royals MD  Sonographer Comments: Image acquisition challenging due to COPD. IMPRESSIONS  1. Left ventricular ejection fraction, by estimation, is 50 to 55%. The left ventricle has low normal function. The left ventricle has no regional wall motion abnormalities. Left ventricular diastolic parameters were normal.  2. Right ventricular systolic function is normal. The right ventricular size is normal.  3. The mitral valve is normal in structure. Mild mitral valve regurgitation.  4. The aortic valve is normal in structure. Aortic valve regurgitation is trivial. FINDINGS  Left Ventricle: Left ventricular ejection fraction, by estimation, is 50 to 55%. The left ventricle has low normal function. The left  ventricle has no regional wall motion abnormalities. The left ventricular internal cavity size was normal in size. There is no left ventricular hypertrophy. Left ventricular diastolic parameters were normal. Right Ventricle: The right ventricular size is normal. No increase in right ventricular wall thickness. Right ventricular systolic function  is normal. Left Atrium: Left atrial size was normal in size. Right Atrium: Right atrial size was normal in size. Pericardium: There is no evidence of pericardial effusion. Mitral Valve: The mitral valve is normal in structure. Mild mitral valve regurgitation. Tricuspid Valve: The tricuspid valve is normal in structure. Tricuspid valve regurgitation is mild. Aortic Valve: The aortic valve is normal in structure. Aortic valve regurgitation is trivial. Aortic valve mean gradient measures 3.0 mmHg. Aortic valve peak gradient measures 4.7 mmHg. Aortic valve area, by VTI measures 1.72 cm. Pulmonic Valve: The pulmonic valve was normal in structure. Pulmonic valve regurgitation is trivial. Aorta: The aortic root and ascending aorta are structurally normal, with no evidence of dilitation. IAS/Shunts: No atrial level shunt detected by color flow Doppler.  LEFT VENTRICLE PLAX 2D LVIDd:         4.27 cm   Diastology LVIDs:         3.37 cm   LV e' medial:    4.79 cm/s LV PW:         1.21 cm   LV E/e' medial:  14.2 LV IVS:        0.78 cm   LV e' lateral:   8.70 cm/s LVOT diam:     2.00 cm   LV E/e' lateral: 7.8 LV SV:         36 LV SV Index:   19 LVOT Area:     3.14 cm  RIGHT VENTRICLE RV Basal diam:  2.86 cm LEFT ATRIUM             Index        RIGHT ATRIUM          Index LA diam:        3.40 cm 1.82 cm/m   RA Area:     8.78 cm LA Vol (A2C):   37.6 ml 20.10 ml/m  RA Volume:   16.70 ml 8.93 ml/m LA Vol (A4C):   20.1 ml 10.74 ml/m LA Biplane Vol: 30.0 ml 16.04 ml/m  AORTIC VALVE                    PULMONIC VALVE AV Area (Vmax):    1.82 cm     PV Vmax:       0.78 m/s AV Area  (Vmean):   1.83 cm     PV Peak grad:  2.4 mmHg AV Area (VTI):     1.72 cm AV Vmax:           108.00 cm/s AV Vmean:          77.000 cm/s AV VTI:            0.208 m AV Peak Grad:      4.7 mmHg AV Mean Grad:      3.0 mmHg LVOT Vmax:         62.70 cm/s LVOT Vmean:        44.800 cm/s LVOT VTI:          0.114 m LVOT/AV VTI ratio: 0.55  AORTA Ao Root diam: 3.20 cm MITRAL VALVE MV Area (PHT): 4.26 cm    SHUNTS MV Decel Time: 178 msec    Systemic VTI:  0.11 m MV E velocity: 68.10 cm/s  Systemic Diam: 2.00 cm MV A velocity: 90.40 cm/s MV E/A ratio:  0.75 Serafina Royals MD Electronically signed by Serafina Royals MD Signature Date/Time: 06/01/2022/1:26:14 PM    Final    DG Chest 2 View  Result  Date: 05/30/2022 CLINICAL DATA:  Right-sided chest pain beginning last night. EXAM: CHEST - 2 VIEW COMPARISON:  05/02/2022 FINDINGS: The heart size and mediastinal contours are within normal limits. Both lungs are clear. The visualized skeletal structures are unremarkable. IMPRESSION: No active cardiopulmonary disease. Electronically Signed   By: Marlaine Hind M.D.   On: 05/30/2022 20:13      Subjective: Seen and examined at time of discharge.  Chest pain-free.  Appropriate for transfer to [facility].  Discharge Exam: Vitals:   06/02/22 0931 06/02/22 1000  BP: (!) 112/49 112/75  Pulse: 66 65  Resp: 18 12  Temp:    SpO2: 92% 96%   Vitals:   06/02/22 0901 06/02/22 0915 06/02/22 0931 06/02/22 1000  BP: 104/64 96/64 (!) 112/49 112/75  Pulse: 68 64 66 65  Resp: '13 12 18 12  '$ Temp:      TempSrc:      SpO2: 91% 91% 92% 96%  Weight:      Height:        General: Pt is alert, awake, not in acute distress Cardiovascular: RRR, S1/S2 +, no rubs, no gallops Respiratory: CTA bilaterally, no wheezing, no rhonchi Abdominal: Soft, NT, ND, bowel sounds + Extremities: no edema, no cyanosis    The results of significant diagnostics from this hospitalization (including imaging, microbiology, ancillary and laboratory) are  listed below for reference.     Microbiology: No results found for this or any previous visit (from the past 240 hour(s)).   Labs: BNP (last 3 results) No results for input(s): "BNP" in the last 8760 hours. Basic Metabolic Panel: Recent Labs  Lab 05/30/22 1944 05/31/22 0510 06/02/22 0518  NA 139 138 138  K 3.0* 3.7 3.4*  CL 104 105 103  CO2 '23 25 27  '$ GLUCOSE 431* 303* 236*  BUN 14 16 26*  CREATININE 1.13* 1.01* 1.35*  CALCIUM 8.5* 9.0 9.0   Liver Function Tests: No results for input(s): "AST", "ALT", "ALKPHOS", "BILITOT", "PROT", "ALBUMIN" in the last 168 hours. No results for input(s): "LIPASE", "AMYLASE" in the last 168 hours. No results for input(s): "AMMONIA" in the last 168 hours. CBC: Recent Labs  Lab 05/30/22 1944 05/31/22 0510 06/01/22 0506 06/02/22 0518  WBC 6.6 9.7 10.2 7.8  NEUTROABS  --   --   --  2.1  HGB 13.2 12.5 13.5 13.5  HCT 40.1 37.6 39.9 39.2  MCV 91.6 90.2 89.5 90.1  PLT 279 282 289 273   Cardiac Enzymes: No results for input(s): "CKTOTAL", "CKMB", "CKMBINDEX", "TROPONINI" in the last 168 hours. BNP: Invalid input(s): "POCBNP" CBG: Recent Labs  Lab 06/01/22 1233 06/01/22 1600 06/01/22 2041 06/02/22 0728 06/02/22 0859  GLUCAP 176* 274* 272* 235* 221*   D-Dimer Recent Labs    05/30/22 1944  DDIMER 0.49   Hgb A1c No results for input(s): "HGBA1C" in the last 72 hours. Lipid Profile No results for input(s): "CHOL", "HDL", "LDLCALC", "TRIG", "CHOLHDL", "LDLDIRECT" in the last 72 hours. Thyroid function studies No results for input(s): "TSH", "T4TOTAL", "T3FREE", "THYROIDAB" in the last 72 hours.  Invalid input(s): "FREET3" Anemia work up No results for input(s): "VITAMINB12", "FOLATE", "FERRITIN", "TIBC", "IRON", "RETICCTPCT" in the last 72 hours. Urinalysis    Component Value Date/Time   COLORURINE STRAW (A) 12/13/2021 1823   APPEARANCEUR CLEAR (A) 12/13/2021 1823   LABSPEC 1.006 12/13/2021 1823   PHURINE 6.0 12/13/2021  1823   GLUCOSEU NEGATIVE 12/13/2021 1823   HGBUR MODERATE (A) 12/13/2021 1823   [place] NEGATIVE 12/13/2021 1823  KETONESUR NEGATIVE 12/13/2021 1823   PROTEINUR NEGATIVE 12/13/2021 1823   NITRITE NEGATIVE 12/13/2021 1823   LEUKOCYTESUR NEGATIVE 12/13/2021 1823   Sepsis Labs Recent Labs  Lab 05/30/22 1944 05/31/22 0510 06/01/22 0506 06/02/22 0518  WBC 6.6 9.7 10.2 7.8   Microbiology No results found for this or any previous visit (from the past 240 hour(s)).   Time coordinating discharge: Over 30 minutes  SIGNED:   Sidney Ace, MD  [facility] 06/02/2022, 11:48 AM Pager   If 7PM-7AM, please contact night-coverage

## 2022-06-02 NOTE — Progress Notes (Signed)
Off the floor for cardiac cath at around 7AM.

## 2022-06-02 NOTE — Progress Notes (Signed)
[facility] Encounter Note  Patient: Anne Brennan / Admit Date: 05/30/2022 / Date of Encounter: 06/02/2022, 8:28 AM   Subjective: Patient overall is felt well overnight with no evidence of significant new cardiovascular symptoms chest pain or shortness of breath.  Telemetry continues to show normal sinus rhythm.  Patient remains on heparin without evidence of significant side effects.  Echocardiogram shows normal LV systolic function with ejection fraction of 55% and with no evidence of significant valvular heart disease.  Cardiac catheterization showing severe three-vessel disease with occluded right coronary artery with collaterals from the left, 99% stenosis of ostial circumflex artery, and severe stenosis of distal left main to left anterior descending artery of 75%.  End-diastolic pressure mildly elevated  Review of Systems: Positive for: None Negative for: Vision change, hearing change, syncope, dizziness, nausea, vomiting,diarrhea, bloody stool, stomach pain, cough, congestion, diaphoresis, urinary frequency, urinary pain,skin lesions, skin rashes Others previously listed  Objective: Telemetry: Normal sinus rhythm Physical Exam: Blood pressure 119/74, pulse 66, temperature 97.6 F (36.4 C), temperature source Oral, resp. rate 20, height 5' (1.524 m), weight 81.8 kg, SpO2 95 %. Body mass index is 35.22 kg/m. General: Well developed, well nourished, in no acute distress. Head: Normocephalic, atraumatic, sclera non-icteric, no xanthomas, nares are without discharge. Neck: No apparent masses Lungs: Normal respirations with no wheezes, no rhonchi, no rales , no crackles   Heart: Regular rate and rhythm, normal S1 S2, no murmur, no rub, no gallop, PMI is normal size and placement, carotid upstroke normal without bruit, jugular venous pressure normal Abdomen: Soft, non-tender, non-distended with normoactive bowel sounds. No hepatosplenomegaly. Abdominal aorta  is normal size without bruit Extremities: No edema, no clubbing, no cyanosis, no ulcers,  Peripheral: 2+ radial, 2+ femoral, 2+ dorsal pedal pulses Neuro: Alert and oriented. Moves all extremities spontaneously. Psych:  Responds to questions appropriately with a normal affect.   Intake/Output Summary (Last 24 hours) at 06/02/2022 0828 Last data filed at 06/01/2022 1946 Gross per 24 hour  Intake --  Output 700 ml  Net -700 ml     Inpatient Medications:   [MAR Hold] amLODipine  5 mg Oral Q breakfast   aspirin       [MAR Hold] aspirin EC  325 mg Oral Daily   [MAR Hold] buPROPion ER  100 mg Oral BID   [MAR Hold] canagliflozin  100 mg Oral QAC breakfast   [MAR Hold] citalopram  20 mg Oral BH-q7a   [MAR Hold] gabapentin  400 mg Oral QHS   [MAR Hold] gabapentin  800 mg Oral BID   [MAR Hold] insulin aspart  0-20 Units Subcutaneous TID AC & HS   [MAR Hold] insulin aspart protamine- aspart  30 Units Subcutaneous BID WC   [MAR Hold] isosorbide mononitrate  30 mg Oral Daily   [MAR Hold] loratadine  10 mg Oral QPM   [MAR Hold] losartan  50 mg Oral Daily   [MAR Hold] metoprolol succinate  50 mg Oral Q breakfast   [MAR Hold] montelukast  10 mg Oral QHS   [MAR Hold] pantoprazole  40 mg Oral Daily   [MAR Hold] rosuvastatin  40 mg Oral QPM   [MAR Hold] sodium chloride flush  3 mL Intravenous Q12H   [MAR Hold] traZODone  100 mg Oral QHS   [MAR Hold] triamterene-hydrochlorothiazide  1 tablet Oral Q breakfast   Infusions:   sodium chloride     [START ON 06/03/2022] sodium chloride 3 mL/kg/hr (06/02/22 0726)   Followed by   [  START ON 06/03/2022] sodium chloride     heparin Stopped (06/02/22 0713)    Labs: Recent Labs    05/31/22 0510 06/02/22 0518  NA 138 138  K 3.7 3.4*  CL 105 103  CO2 25 27  GLUCOSE 303* 236*  BUN 16 26*  CREATININE 1.01* 1.35*  CALCIUM 9.0 9.0    No results for input(s): "AST", "ALT", "ALKPHOS", "BILITOT", "PROT", "ALBUMIN" in the last 72 hours. Recent Labs     06/01/22 0506 06/02/22 0518  WBC 10.2 7.8  NEUTROABS  --  2.1  HGB 13.5 13.5  HCT 39.9 39.2  MCV 89.5 90.1  PLT 289 273    No results for input(s): "CKTOTAL", "CKMB", "TROPONINI" in the last 72 hours. Invalid input(s): "POCBNP" No results for input(s): "HGBA1C" in the last 72 hours.   Weights: Filed Weights   05/31/22 1609 06/02/22 0700 06/02/22 0711  Weight: 81 kg 81.9 kg 81.8 kg     Radiology/Studies:  ECHOCARDIOGRAM COMPLETE  Result Date: 06/01/2022    ECHOCARDIOGRAM REPORT   Patient Name:   Anne Brennan Date of Exam: 05/31/2022 Medical Rec #:  [id number]           Height:       64.0 in Accession #:    [id number]          Weight:       180.0 lb Date of Birth:  [birth year]           BSA:          1.871 m Patient Age:    61 years            BP:           121/70 mmHg Patient Gender: F                   HR:           74 bpm. Exam Location:  [facility] Procedure: 2D Echo, Color Doppler and Cardiac Doppler Indications:     I21.4 NSTEMI  History:         Patient has prior history of Echocardiogram examinations, most                  recent 05/20/2021. CAD, COPD; Risk Factors:Hypertension, Diabetes                  and Dyslipidemia.  Sonographer:     Charmayne Sheer Referring Phys:  [id number] Miller Diagnosing Phys: Serafina Royals MD  Sonographer Comments: Image acquisition challenging due to COPD. IMPRESSIONS  1. Left ventricular ejection fraction, by estimation, is 50 to 55%. The left ventricle has low normal function. The left ventricle has no regional wall motion abnormalities. Left ventricular diastolic parameters were normal.  2. Right ventricular systolic function is normal. The right ventricular size is normal.  3. The mitral valve is normal in structure. Mild mitral valve regurgitation.  4. The aortic valve is normal in structure. Aortic valve regurgitation is trivial. FINDINGS  Left Ventricle: Left ventricular ejection fraction, by estimation, is 50 to 55%. The left ventricle has low normal  function. The left ventricle has no regional wall motion abnormalities. The left ventricular internal cavity size was normal in size. There is no left ventricular hypertrophy. Left ventricular diastolic parameters were normal. Right Ventricle: The right ventricular size is normal. No increase in right ventricular wall thickness. Right ventricular systolic function is normal. Left Atrium: Left atrial size was normal in size. Right  Atrium: Right atrial size was normal in size. Pericardium: There is no evidence of pericardial effusion. Mitral Valve: The mitral valve is normal in structure. Mild mitral valve regurgitation. Tricuspid Valve: The tricuspid valve is normal in structure. Tricuspid valve regurgitation is mild. Aortic Valve: The aortic valve is normal in structure. Aortic valve regurgitation is trivial. Aortic valve mean gradient measures 3.0 mmHg. Aortic valve peak gradient measures 4.7 mmHg. Aortic valve area, by VTI measures 1.72 cm. Pulmonic Valve: The pulmonic valve was normal in structure. Pulmonic valve regurgitation is trivial. Aorta: The aortic root and ascending aorta are structurally normal, with no evidence of dilitation. IAS/Shunts: No atrial level shunt detected by color flow Doppler.  LEFT VENTRICLE PLAX 2D LVIDd:         4.27 cm   Diastology LVIDs:         3.37 cm   LV e' medial:    4.79 cm/s LV PW:         1.21 cm   LV E/e' medial:  14.2 LV IVS:        0.78 cm   LV e' lateral:   8.70 cm/s LVOT diam:     2.00 cm   LV E/e' lateral: 7.8 LV SV:         36 LV SV Index:   19 LVOT Area:     3.14 cm  RIGHT VENTRICLE RV Basal diam:  2.86 cm LEFT ATRIUM             Index        RIGHT ATRIUM          Index LA diam:        3.40 cm 1.82 cm/m   RA Area:     8.78 cm LA Vol (A2C):   37.6 ml 20.10 ml/m  RA Volume:   16.70 ml 8.93 ml/m LA Vol (A4C):   20.1 ml 10.74 ml/m LA Biplane Vol: 30.0 ml 16.04 ml/m  AORTIC VALVE                    PULMONIC VALVE AV Area (Vmax):    1.82 cm     PV Vmax:       0.78  m/s AV Area (Vmean):   1.83 cm     PV Peak grad:  2.4 mmHg AV Area (VTI):     1.72 cm AV Vmax:           108.00 cm/s AV Vmean:          77.000 cm/s AV VTI:            0.208 m AV Peak Grad:      4.7 mmHg AV Mean Grad:      3.0 mmHg LVOT Vmax:         62.70 cm/s LVOT Vmean:        44.800 cm/s LVOT VTI:          0.114 m LVOT/AV VTI ratio: 0.55  AORTA Ao Root diam: 3.20 cm MITRAL VALVE MV Area (PHT): 4.26 cm    SHUNTS MV Decel Time: 178 msec    Systemic VTI:  0.11 m MV E velocity: 68.10 cm/s  Systemic Diam: 2.00 cm MV A velocity: 90.40 cm/s MV E/A ratio:  0.75 Serafina Royals MD Electronically signed by Serafina Royals MD Signature Date/Time: 06/01/2022/1:26:14 PM    Final    DG Chest 2 View  Result Date: 05/30/2022 CLINICAL DATA:  Right-sided chest pain beginning last night. EXAM:  CHEST - 2 VIEW COMPARISON:  05/02/2022 FINDINGS: The heart size and mediastinal contours are within normal limits. Both lungs are clear. The visualized skeletal structures are unremarkable. IMPRESSION: No active cardiopulmonary disease. Electronically Signed   By: Marlaine Hind M.D.   On: 05/30/2022 20:13     Assessment and Recommendation  61 y.o. female with known coronary artery disease COPD diabetes hypertension hyperlipidemia and acute non-ST elevation myocardial infarction needing further treatment options with severe three-vessel coronary artery disease by cardiac catheterization with normal LV systolic function by echocardiogram 1.  Continue current medical regimen and aspirin  for further risk reduction of event of myocardial infarction 2.  Discussed with cardiovascular team for the possibility of further intervention including coronary artery bypass grafting due to severe three-vessel coronary artery disease  3.  High intensity cholesterol therapy 4.  Arrange transfer for coronary bypass grafting  Signed, Serafina Royals M.D. FACC

## 2022-06-02 NOTE — Progress Notes (Signed)
Report given to [place]. Patient will be transferred to [place], planning for CABG. Report given to [facility].

## 2022-06-03 ENCOUNTER — Other Ambulatory Visit: Payer: Self-pay | Admitting: Internal Medicine

## 2022-06-03 DIAGNOSIS — Z1231 Encounter for screening mammogram for malignant neoplasm of breast: Secondary | ICD-10-CM

## 2022-10-07 ENCOUNTER — Inpatient Hospital Stay
Admission: EM | Admit: 2022-10-07 | Discharge: 2022-10-09 | DRG: 193 | Disposition: A | Discharge status: Home / Self Care | Payer: Medicare Other | Attending: Internal Medicine | Admitting: Internal Medicine

## 2022-10-07 ENCOUNTER — Other Ambulatory Visit: Payer: Self-pay

## 2022-10-07 ENCOUNTER — Emergency Department: Payer: Medicare Other

## 2022-10-07 DIAGNOSIS — Z91018 Allergy to other foods: Secondary | ICD-10-CM

## 2022-10-07 DIAGNOSIS — D509 Iron deficiency anemia, unspecified: Secondary | ICD-10-CM | POA: Diagnosis present

## 2022-10-07 DIAGNOSIS — G9340 Encephalopathy, unspecified: Secondary | ICD-10-CM | POA: Diagnosis not present

## 2022-10-07 DIAGNOSIS — Z8673 Personal history of transient ischemic attack (TIA), and cerebral infarction without residual deficits: Secondary | ICD-10-CM

## 2022-10-07 DIAGNOSIS — F17210 Nicotine dependence, cigarettes, uncomplicated: Secondary | ICD-10-CM | POA: Diagnosis present

## 2022-10-07 DIAGNOSIS — Z880 Allergy status to penicillin: Secondary | ICD-10-CM

## 2022-10-07 DIAGNOSIS — E1122 Type 2 diabetes mellitus with diabetic chronic kidney disease: Secondary | ICD-10-CM | POA: Diagnosis present

## 2022-10-07 DIAGNOSIS — I252 Old myocardial infarction: Secondary | ICD-10-CM

## 2022-10-07 DIAGNOSIS — J111 Influenza due to unidentified influenza virus with other respiratory manifestations: Secondary | ICD-10-CM | POA: Diagnosis not present

## 2022-10-07 DIAGNOSIS — Z951 Presence of aortocoronary bypass graft: Secondary | ICD-10-CM

## 2022-10-07 DIAGNOSIS — J45901 Unspecified asthma with (acute) exacerbation: Secondary | ICD-10-CM

## 2022-10-07 DIAGNOSIS — A419 Sepsis, unspecified organism: Secondary | ICD-10-CM

## 2022-10-07 DIAGNOSIS — Z888 Allergy status to other drugs, medicaments and biological substances status: Secondary | ICD-10-CM

## 2022-10-07 DIAGNOSIS — F05 Delirium due to known physiological condition: Secondary | ICD-10-CM | POA: Diagnosis present

## 2022-10-07 DIAGNOSIS — Z1152 Encounter for screening for COVID-19: Secondary | ICD-10-CM

## 2022-10-07 DIAGNOSIS — J441 Chronic obstructive pulmonary disease with (acute) exacerbation: Secondary | ICD-10-CM | POA: Diagnosis not present

## 2022-10-07 DIAGNOSIS — I129 Hypertensive chronic kidney disease with stage 1 through stage 4 chronic kidney disease, or unspecified chronic kidney disease: Secondary | ICD-10-CM | POA: Diagnosis present

## 2022-10-07 DIAGNOSIS — M797 Fibromyalgia: Secondary | ICD-10-CM | POA: Diagnosis present

## 2022-10-07 DIAGNOSIS — Z794 Long term (current) use of insulin: Secondary | ICD-10-CM

## 2022-10-07 DIAGNOSIS — R0902 Hypoxemia: Secondary | ICD-10-CM

## 2022-10-07 DIAGNOSIS — J449 Chronic obstructive pulmonary disease, unspecified: Secondary | ICD-10-CM | POA: Diagnosis present

## 2022-10-07 DIAGNOSIS — G9341 Metabolic encephalopathy: Secondary | ICD-10-CM | POA: Diagnosis present

## 2022-10-07 DIAGNOSIS — E1165 Type 2 diabetes mellitus with hyperglycemia: Secondary | ICD-10-CM | POA: Diagnosis not present

## 2022-10-07 DIAGNOSIS — J1100 Influenza due to unidentified influenza virus with unspecified type of pneumonia: Secondary | ICD-10-CM | POA: Diagnosis not present

## 2022-10-07 DIAGNOSIS — I2510 Atherosclerotic heart disease of native coronary artery without angina pectoris: Secondary | ICD-10-CM | POA: Diagnosis present

## 2022-10-07 DIAGNOSIS — J440 Chronic obstructive pulmonary disease with acute lower respiratory infection: Secondary | ICD-10-CM | POA: Diagnosis present

## 2022-10-07 DIAGNOSIS — Z8249 Family history of ischemic heart disease and other diseases of the circulatory system: Secondary | ICD-10-CM

## 2022-10-07 DIAGNOSIS — I10 Essential (primary) hypertension: Secondary | ICD-10-CM | POA: Diagnosis present

## 2022-10-07 DIAGNOSIS — E785 Hyperlipidemia, unspecified: Secondary | ICD-10-CM | POA: Diagnosis present

## 2022-10-07 DIAGNOSIS — N1831 Chronic kidney disease, stage 3a: Secondary | ICD-10-CM | POA: Diagnosis present

## 2022-10-07 DIAGNOSIS — K219 Gastro-esophageal reflux disease without esophagitis: Secondary | ICD-10-CM | POA: Diagnosis present

## 2022-10-07 DIAGNOSIS — Z90710 Acquired absence of both cervix and uterus: Secondary | ICD-10-CM

## 2022-10-07 LAB — CBC WITH DIFFERENTIAL/PLATELET
Abs Immature Granulocytes: 0.03 10*3/uL (ref 0.00–0.07)
Basophils Absolute: 0 10*3/uL (ref 0.0–0.1)
Basophils Relative: 0 %
Eosinophils Absolute: 0 10*3/uL (ref 0.0–0.5)
Eosinophils Relative: 0 %
HCT: 34 % — ABNORMAL LOW (ref 36.0–46.0)
Hemoglobin: 9.9 g/dL — ABNORMAL LOW (ref 12.0–15.0)
Immature Granulocytes: 0 %
Lymphocytes Relative: 12 %
Lymphs Abs: 0.8 10*3/uL (ref 0.7–4.0)
MCH: 22.3 pg — ABNORMAL LOW (ref 26.0–34.0)
MCHC: 29.1 g/dL — ABNORMAL LOW (ref 30.0–36.0)
MCV: 76.7 fL — ABNORMAL LOW (ref 80.0–100.0)
Monocytes Absolute: 0.5 10*3/uL (ref 0.1–1.0)
Monocytes Relative: 8 %
Neutro Abs: 5.5 10*3/uL (ref 1.7–7.7)
Neutrophils Relative %: 80 %
Platelets: 333 10*3/uL (ref 150–400)
RBC: 4.43 MIL/uL (ref 3.87–5.11)
RDW: 19.7 % — ABNORMAL HIGH (ref 11.5–15.5)
WBC: 6.9 10*3/uL (ref 4.0–10.5)
nRBC: 0 % (ref 0.0–0.2)

## 2022-10-07 LAB — RESP PANEL BY RT-PCR (RSV, FLU A&B, COVID)  RVPGX2
Influenza A by PCR: POSITIVE — AB
Influenza B by PCR: NEGATIVE
Resp Syncytial Virus by PCR: NEGATIVE
SARS Coronavirus 2 by RT PCR: NEGATIVE

## 2022-10-07 LAB — CBG MONITORING, ED
Glucose-Capillary: 250 mg/dL — ABNORMAL HIGH (ref 70–99)
Glucose-Capillary: 305 mg/dL — ABNORMAL HIGH (ref 70–99)

## 2022-10-07 LAB — BASIC METABOLIC PANEL
Anion gap: 12 (ref 5–15)
BUN: 15 mg/dL (ref 8–23)
CO2: 25 mmol/L (ref 22–32)
Calcium: 9.8 mg/dL (ref 8.9–10.3)
Chloride: 108 mmol/L (ref 98–111)
Creatinine, Ser: 0.73 mg/dL (ref 0.44–1.00)
GFR, Estimated: >60 mL/min (ref >60)
Glucose, Bld: 273 mg/dL — ABNORMAL HIGH (ref 70–99)
Potassium: 3.2 mmol/L — ABNORMAL LOW (ref 3.5–5.1)
Sodium: 145 mmol/L (ref 135–145)

## 2022-10-07 LAB — PROCALCITONIN: Procalcitonin: 0.21 ng/mL

## 2022-10-07 LAB — LACTIC ACID, PLASMA: Lactic Acid, Venous: 2 mmol/L (ref 0.5–1.9)

## 2022-10-07 MED ORDER — ONDANSETRON HCL 4 MG PO TABS: 4.0000 mg | ORAL_TABLET | Freq: Four times a day (QID) | ORAL | Status: DC | PRN

## 2022-10-07 MED ORDER — IPRATROPIUM-ALBUTEROL 0.5-2.5 (3) MG/3ML IN SOLN
3.0000 mL | Freq: Four times a day (QID) | RESPIRATORY_TRACT | Status: DC
  Administered 2022-10-07 – 2022-10-08 (×5): 3 mL via RESPIRATORY_TRACT
  Filled 2022-10-07 (×5): qty 3

## 2022-10-07 MED ORDER — PANTOPRAZOLE SODIUM 40 MG PO TBEC
40.0000 mg | DELAYED_RELEASE_TABLET | Freq: Every day | ORAL | Status: DC
  Administered 2022-10-07 – 2022-10-09 (×3): 40 mg via ORAL
  Filled 2022-10-07 (×3): qty 1

## 2022-10-07 MED ORDER — OSELTAMIVIR PHOSPHATE 75 MG PO CAPS
75.0000 mg | ORAL_CAPSULE | Freq: Two times a day (BID) | ORAL | Status: DC
  Administered 2022-10-08 – 2022-10-09 (×2): 75 mg via ORAL
  Filled 2022-10-07 (×4): qty 1

## 2022-10-07 MED ORDER — ONDANSETRON HCL 4 MG/2ML IJ SOLN: 4.0000 mg | Freq: Four times a day (QID) | INTRAMUSCULAR | Status: DC | PRN

## 2022-10-07 MED ORDER — HYDROCODONE-ACETAMINOPHEN 10-325 MG PO TABS: 1.0000 | ORAL_TABLET | Freq: Four times a day (QID) | ORAL | Status: DC | PRN

## 2022-10-07 MED ORDER — FUROSEMIDE 40 MG PO TABS
40.0000 mg | ORAL_TABLET | Freq: Every day | ORAL | Status: DC
  Administered 2022-10-07 – 2022-10-09 (×3): 40 mg via ORAL
  Filled 2022-10-07 (×3): qty 1

## 2022-10-07 MED ORDER — METOPROLOL TARTRATE 25 MG PO TABS
25.0000 mg | ORAL_TABLET | Freq: Two times a day (BID) | ORAL | Status: DC
  Administered 2022-10-07 – 2022-10-09 (×4): 25 mg via ORAL
  Filled 2022-10-07 (×3): qty 1

## 2022-10-07 MED ORDER — SODIUM CHLORIDE 0.9 % IV BOLUS
1000.0000 mL | Freq: Once | INTRAVENOUS | Status: AC
  Administered 2022-10-07: 1000 mL via INTRAVENOUS

## 2022-10-07 MED ORDER — ATORVASTATIN CALCIUM 20 MG PO TABS
80.0000 mg | ORAL_TABLET | Freq: Every day | ORAL | Status: DC
  Administered 2022-10-07 – 2022-10-09 (×3): 80 mg via ORAL
  Filled 2022-10-07 (×3): qty 4

## 2022-10-07 MED ORDER — ACETAMINOPHEN 500 MG PO TABS
1000.0000 mg | ORAL_TABLET | ORAL | Status: AC
  Administered 2022-10-07: 1000 mg via ORAL
  Filled 2022-10-07: qty 2

## 2022-10-07 MED ORDER — INSULIN ASPART 100 UNIT/ML IJ SOLN
0.0000 [IU] | Freq: Three times a day (TID) | INTRAMUSCULAR | Status: DC
  Administered 2022-10-08: 11 [IU] via SUBCUTANEOUS
  Administered 2022-10-08: 3 [IU] via SUBCUTANEOUS
  Filled 2022-10-07 (×3): qty 1

## 2022-10-07 MED ORDER — METHYLPREDNISOLONE SODIUM SUCC 125 MG IJ SOLR
125.0000 mg | INTRAMUSCULAR | Status: AC
  Administered 2022-10-07: 125 mg via INTRAVENOUS
  Filled 2022-10-07: qty 2

## 2022-10-07 MED ORDER — MONTELUKAST SODIUM 10 MG PO TABS
10.0000 mg | ORAL_TABLET | Freq: Every day | ORAL | Status: DC
  Administered 2022-10-07 – 2022-10-08 (×2): 10 mg via ORAL
  Filled 2022-10-07: qty 1

## 2022-10-07 MED ORDER — LOSARTAN POTASSIUM 50 MG PO TABS
50.0000 mg | ORAL_TABLET | Freq: Every day | ORAL | Status: DC
  Administered 2022-10-08 – 2022-10-09 (×2): 50 mg via ORAL
  Filled 2022-10-07 (×2): qty 1

## 2022-10-07 MED ORDER — OSELTAMIVIR PHOSPHATE 75 MG PO CAPS
75.0000 mg | ORAL_CAPSULE | Freq: Once | ORAL | Status: AC
  Administered 2022-10-07: 75 mg via ORAL
  Filled 2022-10-07: qty 1

## 2022-10-07 MED ORDER — BENZONATATE 100 MG PO CAPS: 200.0000 mg | ORAL_CAPSULE | Freq: Three times a day (TID) | ORAL | Status: DC | PRN

## 2022-10-07 MED ORDER — CITALOPRAM HYDROBROMIDE 20 MG PO TABS
20.0000 mg | ORAL_TABLET | Freq: Every day | ORAL | Status: DC
  Administered 2022-10-07: 20 mg via ORAL
  Filled 2022-10-07 (×2): qty 1

## 2022-10-07 MED ORDER — PREDNISONE 20 MG PO TABS
40.0000 mg | ORAL_TABLET | Freq: Every day | ORAL | Status: DC
  Administered 2022-10-08 – 2022-10-09 (×2): 40 mg via ORAL
  Filled 2022-10-07 (×2): qty 2

## 2022-10-07 MED ORDER — MONTELUKAST SODIUM 10 MG PO TABS
ORAL_TABLET | ORAL | Status: AC
  Filled 2022-10-07: qty 1

## 2022-10-07 MED ORDER — ACETAMINOPHEN 325 MG PO TABS: 650.0000 mg | ORAL_TABLET | Freq: Four times a day (QID) | ORAL | Status: DC | PRN

## 2022-10-07 MED ORDER — POLYETHYLENE GLYCOL 3350 17 G PO PACK: 17.0000 g | PACK | Freq: Every day | ORAL | Status: DC | PRN

## 2022-10-07 MED ORDER — INSULIN GLARGINE-YFGN 100 UNIT/ML ~~LOC~~ SOLN
20.0000 [IU] | Freq: Every day | SUBCUTANEOUS | Status: DC
  Administered 2022-10-08 (×2): 20 [IU] via SUBCUTANEOUS
  Filled 2022-10-07 (×4): qty 0.2

## 2022-10-07 MED ORDER — ACETAMINOPHEN 650 MG RE SUPP: 650.0000 mg | Freq: Four times a day (QID) | RECTAL | Status: DC | PRN

## 2022-10-07 MED ORDER — ENOXAPARIN SODIUM 40 MG/0.4ML IJ SOSY
PREFILLED_SYRINGE | INTRAMUSCULAR | Status: AC
  Filled 2022-10-07: qty 0.4

## 2022-10-07 MED ORDER — METOPROLOL TARTRATE 25 MG PO TABS
ORAL_TABLET | ORAL | Status: AC
  Filled 2022-10-07: qty 1

## 2022-10-07 MED ORDER — IPRATROPIUM-ALBUTEROL 0.5-2.5 (3) MG/3ML IN SOLN
3.0000 mL | Freq: Once | RESPIRATORY_TRACT | Status: AC
  Administered 2022-10-07: 3 mL via RESPIRATORY_TRACT
  Filled 2022-10-07: qty 3

## 2022-10-07 MED ORDER — ENOXAPARIN SODIUM 40 MG/0.4ML IJ SOSY
40.0000 mg | PREFILLED_SYRINGE | INTRAMUSCULAR | Status: DC
  Administered 2022-10-07 – 2022-10-08 (×2): 40 mg via SUBCUTANEOUS
  Filled 2022-10-07: qty 0.4

## 2022-10-07 MED ORDER — POTASSIUM CHLORIDE 20 MEQ PO PACK
40.0000 meq | PACK | ORAL | Status: AC
  Administered 2022-10-07: 40 meq via ORAL
  Filled 2022-10-07: qty 2

## 2022-10-07 NOTE — Assessment & Plan Note (Signed)
Patient with wheezing on examination, cough initially requiring supplemental oxygen to maintain oxygen saturations; oxygen saturation improved with DuoNebs.  Secondary to influenza.  -Continue supplemental oxygen as needed to maintain oxygen saturation above 90% - DuoNebs every 6 hours - S/p Solu-Medrol 125 mg - Start prednisone 40 mg daily tomorrow to complete a 5-day course - No indication for antibiotics at this time

## 2022-10-07 NOTE — ED Provider Triage Note (Signed)
Emergency Medicine Provider Triage Evaluation Note  Anne Brennan , a 61 y.o. female  was evaluated in triage.  Pt complains of headache, cough.  Reports daughter is sick with same symptoms. Limited response to questions, says "yes" and "no" and follows commands. She is requiring O2. Unclear baseline.  Review of Systems  Positive: Headache, cough Negative: fever  Physical Exam  There were no vitals taken for this visit. Gen:   Awake, no distress/ Limited response to questions Resp:  On 2l [place] MSK:   Moves extremities without difficulty  Other:    Medical Decision Making  Medically screening exam initiated at 1:32 PM.  Appropriate orders placed.  Anne Brennan was informed that the remainder of the evaluation will be completed by another provider, this initial triage assessment does not replace that evaluation, and the importance of remaining in the ED until their evaluation is complete.     [name], PA-C 10/07/22 1340

## 2022-10-07 NOTE — Assessment & Plan Note (Addendum)
CBC demonstrates new microcytic anemia.  No prior history.  No bleeding on examination noted.  - Trend CBC while admitted - Iron panel in the a.m.

## 2022-10-07 NOTE — ED Triage Notes (Signed)
First RN Pt BIB [facility] for a headache. Pt alert and oriented. Pt also been having cough and A&O x3   Vitals for EMS 91% RA  97% on 2LPM (new oxygen requirement.) 98.56F 390 CBG 118 HR 34 RR

## 2022-10-07 NOTE — H&P (Addendum)
History and Physical    Patient: Anne Brennan:[id number] DOB: 01-11-1961 DOA: 10/07/2022 DOS: the patient was seen and examined on 10/07/2022 PCP: Idelle Crouch, MD  Patient coming from: Home  Chief Complaint:  Chief Complaint  Patient presents with   Headache   Cough   HPI: Anne Brennan is a 61 y.o. 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  History obtained partially from patient and through chart review due to altered mental status.  At this time, Ms. Artola denies any complaints including cough, chest pain, shortness of breath.  She is unsure what brought her in to the ER and is unsure how she arrived here.  After discussion, she is able to states she believes she is here due to not feeling well but cannot tell me when her symptoms began.  Per chart review, patient was brought in via EMS due to headache and cough.  In addition, patient was experiencing a runny nose, fatigue and generalized weakness in addition to confusion.  Apparently a family member had similar symptoms but not as severe.  ED course: On arrival to the ED, patient was afebrile at 99.3 with blood pressure 137/87 heart rate of 116.  She was saturating in the low to mid 80s with improvement to 98% on 2 L of supplemental oxygen.  Initial workup remarkable for potassium of 3.2, glucose of 273, hemoglobin of 9.9, and  influenza A PCR positive.  Chest x-ray with possible patchy airspace disease in the left base.  Patient was started on Tamiflu.  TRH contacted for admission.   Review of Systems: As mentioned in the history of present illness. All other systems reviewed and are negative. Past Medical History:  Diagnosis Date   Asthma    Bilateral low back pain with bilateral sciatica     Bulging lumbar disc    COPD (chronic obstructive pulmonary disease) (HCC)    Coronary artery disease    Diabetes mellitus without complication (HCC)    Fibromyalgia    GERD (gastroesophageal reflux disease)    Hyperlipidemia    Hypertension    Stroke ([place]) 04/2021   Past Surgical History:  Procedure Laterality Date   ABDOMINAL HYSTERECTOMY     KNEE ARTHROSCOPY Right    LEFT HEART CATH AND CORONARY ANGIOGRAPHY N/A 06/02/2022   Procedure: LEFT HEART CATH AND CORONARY ANGIOGRAPHY;  Surgeon: Corey Skains, MD;  Location: [facility];  Service: Cardiovascular;  Laterality: N/A;   MICROLARYNGOSCOPY Bilateral 10/23/2021   Procedure: MICROLARYNGOSCOPY WITH EXCISION VOCAL CORD POLYPS;  Surgeon: Clyde Canterbury, MD;  Location: [facility];  Service: ENT;  Laterality: Bilateral;  Diabetic   Social History:  reports that she has been smoking cigarettes. She has a 22.00 pack-year smoking history. She has never used smokeless tobacco. She reports that she does not drink alcohol and does not use drugs.  Allergies  Allergen Reactions   Ibuprofen Other (See Comments)    N/V/ salivate   Peach [Prunus Persica] Itching and Swelling    Mouth itches and swells   Penicillins Rash    Family History  Problem Relation Age of Onset   Heart attack Mother    Heart attack Father    Heart failure Sister    Heart attack Brother     Prior to Admission medications   Medication  Sig Start Date End Date Taking? Authorizing Provider  albuterol (VENTOLIN HFA) 108 (90 Base) MCG/ACT inhaler Inhale 2 puffs into the lungs every 4 (four) hours as needed for shortness of breath or wheezing. 04/30/21   [provider]  amLODipine (NORVASC) 5 MG tablet Take 5 mg by mouth every morning. 05/15/21   [provider]  aspirin EC 325 MG tablet Take 1 tablet (325 mg total) by mouth daily. 05/03/22   Ivor Costa, MD  Azelastine HCl 137 MCG/SPRAY SOLN SMARTSIG:1-2 Spray(s) Both Nares Twice Daily 04/19/22    [provider]  benzonatate (TESSALON) 200 MG capsule Take 200 mg by mouth 3 (three) times daily as needed. 05/21/22   [provider]  buPROPion ER (WELLBUTRIN SR) 100 MG 12 hr tablet Take 100 mg by mouth 2 (two) times daily. 04/06/22   [provider]  canagliflozin (INVOKANA) 100 MG TABS tablet Take by mouth daily before breakfast.    [provider]  citalopram (CELEXA) 20 MG tablet Take 20 mg by mouth every morning. 02/03/21   [provider]  cyclobenzaprine (FLEXERIL) 10 MG tablet Take 10 mg by mouth 3 (three) times daily. 01/11/21   [provider]  fluticasone (FLONASE) 50 MCG/ACT nasal spray Place 1 spray into both nostrils at bedtime.    [provider]  gabapentin (NEURONTIN) 400 MG capsule Take 800 mg by mouth 3 (three) times daily as needed. 04/30/21   [provider]  HUMULIN N KWIKPEN 100 UNIT/ML KwikPen Inject 40 Units into the skin in the morning and at bedtime. 05/29/22   [provider]  HYDROcodone-acetaminophen (NORCO) 10-325 MG tablet Take 1 tablet by mouth 4 (four) times daily as needed. 05/30/22   [provider]  isosorbide mononitrate (IMDUR) 30 MG 24 hr tablet Take 1 tablet (30 mg total) by mouth daily. 05/02/22   Ivor Costa, MD  levocetirizine (XYZAL) 5 MG tablet Take 5 mg by mouth every evening.    [provider]  liraglutide (VICTOZA) 18 MG/3ML SOPN Inject 1.8 mg into the skin every morning.    [provider]  lisinopril (ZESTRIL) 10 MG tablet Take 10 mg by mouth daily.    [provider]  losartan (COZAAR) 50 MG tablet Take 1 tablet (50 mg total) by mouth daily. 06/02/22   Sidney Ace, MD  metFORMIN (GLUCOPHAGE-XR) 500 MG 24 hr tablet Take 1,000 mg by mouth daily. 01/11/21   [provider]  metoprolol succinate (TOPROL-XL) 50 MG 24 hr tablet Take 50 mg by mouth every morning. 01/05/21   [provider]  montelukast (SINGULAIR) 10 MG  tablet Take 10 mg by mouth at bedtime. 04/26/21   [provider]  NOVOLIN 70/30 (70-30) 100 UNIT/ML injection Inject 44 Units into the skin 2 (two) times daily. 04/29/21   [provider]  omeprazole (PRILOSEC) 20 MG capsule Take 20 mg by mouth 2 (two) times daily. 05/08/22   [provider]  predniSONE (DELTASONE) 10 MG tablet Take 10 mg by mouth as directed. Taper 05/30/22   [provider]  rosuvastatin (CRESTOR) 40 MG tablet Take 40 mg by mouth every evening. Patient not taking: Reported on 05/30/2022 04/19/22   [provider]  traZODone (DESYREL) 100 MG tablet Take 100 mg by mouth at bedtime. 04/24/21   [provider]  triamterene-hydrochlorothiazide (MAXZIDE-25) 37.5-25 MG tablet Take 1 tablet by mouth every morning. 05/15/21   [provider]    Physical Exam: Vitals:   10/07/22  1800 10/07/22 1830 10/07/22 1919 10/07/22 2030  BP: (!) 151/79 139/86  (!) 141/91  Pulse: (!) 115  (!) 116 (!) 115  Resp: (!) 34 (!) 25 (!) 28 (!) 29  Temp:    98.4 F (36.9 C)  TempSrc:    Oral  SpO2: 100%  100% 98%  Weight:      Height:       Physical Exam Vitals and nursing note reviewed.  Constitutional:      General: She is not in acute distress.    Appearance: She is normal weight. She is not toxic-appearing.  HENT:     Head: Normocephalic and atraumatic.     Mouth/Throat:     Mouth: Mucous membranes are dry.     Pharynx: Oropharynx is clear.  Eyes:     Conjunctiva/sclera: Conjunctivae normal.     Pupils: Pupils are equal, round, and reactive to light.  Cardiovascular:     Rate and Rhythm: Regular rhythm. Tachycardia present.     Heart sounds: No murmur heard. Pulmonary:     Effort: Tachypnea present. No accessory muscle usage.     Breath sounds: Wheezing (Intermittent expiratory wheezing) and rhonchi (Vital rhonchi heard throughout, left worse than right.) present. No decreased breath sounds or rales.  Abdominal:     General: Bowel  sounds are normal. There is no distension.     Palpations: Abdomen is soft.     Tenderness: There is no abdominal tenderness. There is no guarding.  Musculoskeletal:     Right lower leg: 1+ Pitting Edema present.     Left lower leg: 1+ Pitting Edema present.  Skin:    General: Skin is warm and dry.  Neurological:     Mental Status: She is alert. She is disoriented.     Motor: No weakness.     Comments:  Patient is alert and oriented to person only.  She believes the date is November 14, 2020.  She is aware that she is in the hospital but is uncertain what brought her in or how she arrived here.  No focal weakness or sensory deficits noted.  Psychiatric:        Behavior: Behavior normal.    Data Reviewed: CBC with WBC 6.9, hemoglobin 9.9, MCV of 76, and platelets of 333. BMP with potassium of 3.2, bicarb of 25, glucose of 273, creatinine 0.73. Procalcitonin 0.21 Lactic acid mildly elevated 2.0. Influenza A PCR positive  CT Head Wo Contrast  Result Date: 10/07/2022 CLINICAL DATA:  Mental status change, unknown cause EXAM: CT HEAD WITHOUT CONTRAST TECHNIQUE: Contiguous axial images were obtained from the base of the skull through the vertex without intravenous contrast. RADIATION DOSE REDUCTION: This exam was performed according to the departmental dose-optimization program which includes automated exposure control, adjustment of the mA and/or kV according to patient size and/or use of iterative reconstruction technique. COMPARISON:  05/19/2021. FINDINGS: Brain: No evidence of acute infarction, hemorrhage, hydrocephalus, extra-axial collection or mass lesion/mass effect. Vascular: No hyperdense vessel or unexpected calcification. Skull: No acute fracture. Sinuses/Orbits: No substantial paranasal sinus mucosal thickening. No acute orbital findings. IMPRESSION: No evidence of acute intracranial abnormality. Electronically Signed   By: Margaretha Sheffield M.D.   On: 10/07/2022 14:17   DG Chest  2 View  Result Date: 10/07/2022 CLINICAL DATA:  Cough. EXAM: CHEST - 2 VIEW COMPARISON:  05/30/2022 FINDINGS: The cardio pericardial silhouette is enlarged. Interstitial markings are diffusely coarsened with chronic features. Patchy airspace disease is seen at the left base  compatible with pneumonia. Tiny pleural effusion is noted on the right. The visualized bony structures of the thorax are unremarkable. IMPRESSION: 1. Patchy airspace disease at the left base compatible with pneumonia. 2. Tiny right pleural effusion. Electronically Signed   By: Misty Stanley M.D.   On: 10/07/2022 13:58    There are no new results to review at this time.  Assessment and Plan: * Influenza Per chart review, patient presenting with 2-day history of cough, runny nose, generalized weakness and malaise.  She was requiring 2 L supplemental oxygen initially but this has improved at this time.  -Tamiflu, 5-day course - Will treat for COPD exacerbation given wheezing on examination as detailed below - Tessalon Perles as needed for cough  Chronic obstructive pulmonary disease (COPD) ([place]) Patient with wheezing on examination, cough initially requiring supplemental oxygen to maintain oxygen saturations; oxygen saturation improved with DuoNebs.  Secondary to influenza.  -Continue supplemental oxygen as needed to maintain oxygen saturation above 90% - DuoNebs every 6 hours - S/p Solu-Medrol 125 mg - Start prednisone 40 mg daily tomorrow to complete a 5-day course - No indication for antibiotics at this time  Acute encephalopathy Patient with waxing and waning disorientation, likely delirium versus metabolic encephalopathy given underlying infection.  No focal findings to suggest CVA.  CT head obtained in the ED with no acute findings.  - Management of flu as noted above - Delirium precautions  Uncontrolled type 2 diabetes mellitus with hyperglycemia, with long-term current use of insulin ([place]) - Hold home  medications - SSI, resistant - Semglee 20 mg nightly  Essential hypertension - Continue home medications  Chronic kidney disease, stage 3a ([place]) Renal function currently at baseline.  - Monitor renal function while admitted  Microcytic anemia CBC demonstrates new microcytic anemia.  No prior history.  No bleeding on examination noted.  - Trend CBC while admitted - Iron panel in the a.m.  Advance Care Planning:   Code Status: Full Code.  Patient is currently disoriented and so will remain full code at this time.   Consults: None  Family Communication: Patient's son Coralyn Mark updated via telephone.  Severity of Illness: The appropriate patient status for this patient is OBSERVATION. Observation status is judged to be reasonable and necessary in order to provide the required intensity of service to ensure the patient's safety. The patient's presenting symptoms, physical exam findings, and initial radiographic and laboratory data in the context of their medical condition is felt to place them at decreased risk for further clinical deterioration. Furthermore, it is anticipated that the patient will be medically stable for discharge from the hospital within 2 midnights of admission.   Author: Jose Persia, MD 10/07/2022 11:00 PM  For on call review [email or web address].

## 2022-10-07 NOTE — ED Provider Notes (Signed)
[facility] Provider Note    Event Date/Time   First MD Initiated Contact with Patient 10/07/22 1743     (approximate)   History   Headache and Cough   HPI  Anne Brennan is a 61 y.o. female  asthma, COPD, coronary artery disease, type 2 diabetes mellitus, fibromyalgia, GERD, hypertension, dyslipidemia and CVA accordance with discharge summary from August 14 of this year in which the patient was seen and treated for an NSTEMI   For about the last 2 days has been having a runny nose cough and has been not quite acting herself fatigued and weak.  Earlier she seemed confused.  Has a family member that is also been ill recently with similar symptoms but not as severe  Patient reports not in pain or discomfort.  She reports she is doing "all right" but is feeling better with oxygen treatment.  She is a cigarette smoker.  She is not having nausea or vomiting.  She has been feeling some body aches  Physical Exam   Triage Vital Signs: ED Triage Vitals  Enc Vitals Group     BP 10/07/22 1334 137/87     Pulse Rate 10/07/22 1334 (!) 116     Resp 10/07/22 1334 18     Temp 10/07/22 1334 99.3 F (37.4 C)     Temp Source 10/07/22 1334 Oral     SpO2 10/07/22 1334 98 %     Weight 10/07/22 1335 180 lb (81.6 kg)     Height 10/07/22 1335 5' (1.524 m)     Head Circumference --      Peak Flow --      Pain Score --      Pain Loc --      Pain Edu? --      Excl. in [place]? --     Most recent vital signs: Vitals:   10/07/22 1800 10/07/22 1830  BP: (!) 151/79 139/86  Pulse: (!) 115   Resp: (!) 34 (!) 25  Temp:    SpO2: 100%      General: Awake, no distress.  She appears fatigued, but is pleasant oriented without distress. CV:  Good peripheral perfusion.  Mild tachycardia with normal heart tones Resp:  Normal effort.  Lung sounds with mild expiratory wheezing occasional cough.  No focal rales noted on exam.  Does have mild coryza Abd:  No distention.   Other:  Soft nontender nondistended abdomen Warm well-perfused throughout extremities   ED Results / Procedures / Treatments   Labs (all labs ordered are listed, but only abnormal results are displayed) Labs Reviewed  RESP PANEL BY RT-PCR (RSV, FLU A&B, COVID)  RVPGX2 - Abnormal; Notable for the following components:      Result Value   Influenza A by PCR POSITIVE (*)    All other components within normal limits  BASIC METABOLIC PANEL - Abnormal; Notable for the following components:   Potassium 3.2 (*)    Glucose, Bld 273 (*)    All other components within normal limits  CBC WITH DIFFERENTIAL/PLATELET - Abnormal; Notable for the following components:   Hemoglobin 9.9 (*)    HCT 34.0 (*)    MCV 76.7 (*)    MCH 22.3 (*)    MCHC 29.1 (*)    RDW 19.7 (*)    All other components within normal limits  LACTIC ACID, PLASMA - Abnormal; Notable for the following components:   Lactic Acid, Venous 2.0 (*)  All other components within normal limits  CBG MONITORING, ED - Abnormal; Notable for the following components:   Glucose-Capillary 250 (*)    All other components within normal limits  CULTURE, BLOOD (ROUTINE X 2)  CULTURE, BLOOD (ROUTINE X 2)  PROCALCITONIN    RADIOLOGY Chest x-ray interpreted by me concerning for left lower lobe infiltrative process    PROCEDURES:  Critical Care performed: No  Procedures   MEDICATIONS ORDERED IN ED: Medications  sodium chloride 0.9 % bolus 1,000 mL (1,000 mLs Intravenous New Bag/Given 10/07/22 1831)  acetaminophen (TYLENOL) tablet 1,000 mg (1,000 mg Oral Given 10/07/22 1829)  methylPREDNISolone sodium succinate (SOLU-MEDROL) 125 mg/2 mL injection 125 mg (125 mg Intravenous Given 10/07/22 1826)  ipratropium-albuterol (DUONEB) 0.5-2.5 (3) MG/3ML nebulizer solution 3 mL (3 mLs Nebulization Given 10/07/22 1838)  oseltamivir (TAMIFLU) capsule 75 mg (75 mg Oral Given 10/07/22 1828)  potassium chloride (KLOR-CON) packet 40 mEq (40 mEq  Oral Given 10/07/22 1830)     IMPRESSION / MDM / [place] / ED COURSE  I reviewed the triage vital signs and the nursing notes.                              Differential diagnosis includes, but is not limited to, possible viral syndrome pneumonia or other acute pulmonary or upper respiratory infection based on the clinical history provided.  Evidently earlier she was somewhat confused, but she is currently on oxygen saturating in the high 90s and appears to be improved, her mental status seems to be improved notably.  She is tachycardic appears somewhat volume depleted, and I will provide hydration and also suspect she has underlying exacerbation of bronchitis.  Flu test positive.  Patient has had symptoms for less than 48 hours.  Will start on Tamiflu, IV fluids, steroids and nebulizer treatment.  Given the patient's oxygen requirement will admit to the hospitalist service  My impression is that this is unlikely represent acute bacterial pneumonia given the positive flu test and symptomatology, but I have sent a procalcitonin which might help guide his treatment and would make consideration for potential antibiotic therapy.  Discussed this with the hospitalist as well, who will see and evaluate for admission  Patient's presentation is most consistent with acute presentation with potential threat to life or bodily function.  Abx deferred to hospitalist team, I discussed the case and findings with Dr. Charleen Kirks at this juncture would consider initiation of antibiotic if patient's procalcitonin elevated, but if low find most likely infiltrate is secondary to influenza   The patient is on the cardiac monitor to evaluate for evidence of arrhythmia and/or significant heart rate changes.  Labs reviewed notable for a mild anemia from baseline.  Also noted a mild low kalemia which we will replete as well as hyperglycemia without evidence of DKA.   Patient admitted to the service of Dr.  Charleen Kirks  The patient is noted to have markers of systemic inflammatory response syndrome, but given this is likely secondary to viral infection I did not initiate code sepsis.   FINAL CLINICAL IMPRESSION(S) / ED DIAGNOSES   Final diagnoses:  Influenza  Reactive airway disease with acute exacerbation, unspecified asthma severity, unspecified whether persistent  Hypoxia  Sepsis, due to unspecified organism, unspecified whether acute organ dysfunction present [facility])     Rx / DC Orders   ED Discharge Orders     None        Note:  This document was prepared using Dragon voice recognition software and may include unintentional dictation errors.   Delman Kitten, MD 10/07/22 [phone]

## 2022-10-07 NOTE — Assessment & Plan Note (Signed)
-   Continue home medications 

## 2022-10-07 NOTE — Assessment & Plan Note (Signed)
Per chart review, patient presenting with 2-day history of cough, runny nose, generalized weakness and malaise.  She was requiring 2 L supplemental oxygen initially but this has improved at this time.  -Tamiflu, 5-day course - Will treat for COPD exacerbation given wheezing on examination as detailed below - Tessalon Perles as needed for cough

## 2022-10-07 NOTE — ED Triage Notes (Signed)
Patient refusing to answer questions.  JUst keeps stating "headache and yeah".  WHen we ask questions she will answer yes and no but nothing else.  Patient has been exposed to her daughter who is sick.

## 2022-10-07 NOTE — ED Notes (Addendum)
Blood cultures #1 and #2 sent & labs.

## 2022-10-07 NOTE — Assessment & Plan Note (Signed)
Patient with waxing and waning disorientation, likely delirium versus metabolic encephalopathy given underlying infection.  No focal findings to suggest CVA.  CT head obtained in the ED with no acute findings.  - Management of flu as noted above - Delirium precautions

## 2022-10-07 NOTE — Assessment & Plan Note (Signed)
Renal function currently at baseline.  - Monitor renal function while admitted

## 2022-10-07 NOTE — Assessment & Plan Note (Signed)
-   Hold home medications - SSI, resistant - Semglee 20 mg nightly

## 2022-10-08 ENCOUNTER — Other Ambulatory Visit: Payer: Self-pay

## 2022-10-08 DIAGNOSIS — K219 Gastro-esophageal reflux disease without esophagitis: Secondary | ICD-10-CM | POA: Diagnosis present

## 2022-10-08 DIAGNOSIS — N1831 Chronic kidney disease, stage 3a: Secondary | ICD-10-CM

## 2022-10-08 DIAGNOSIS — M797 Fibromyalgia: Secondary | ICD-10-CM | POA: Diagnosis present

## 2022-10-08 DIAGNOSIS — R0902 Hypoxemia: Secondary | ICD-10-CM

## 2022-10-08 DIAGNOSIS — J111 Influenza due to unidentified influenza virus with other respiratory manifestations: Secondary | ICD-10-CM | POA: Diagnosis not present

## 2022-10-08 DIAGNOSIS — D509 Iron deficiency anemia, unspecified: Secondary | ICD-10-CM

## 2022-10-08 DIAGNOSIS — Z794 Long term (current) use of insulin: Secondary | ICD-10-CM | POA: Diagnosis not present

## 2022-10-08 DIAGNOSIS — E1165 Type 2 diabetes mellitus with hyperglycemia: Secondary | ICD-10-CM | POA: Diagnosis present

## 2022-10-08 DIAGNOSIS — Z91018 Allergy to other foods: Secondary | ICD-10-CM | POA: Diagnosis not present

## 2022-10-08 DIAGNOSIS — F05 Delirium due to known physiological condition: Secondary | ICD-10-CM | POA: Diagnosis present

## 2022-10-08 DIAGNOSIS — I2510 Atherosclerotic heart disease of native coronary artery without angina pectoris: Secondary | ICD-10-CM | POA: Diagnosis present

## 2022-10-08 DIAGNOSIS — E1122 Type 2 diabetes mellitus with diabetic chronic kidney disease: Secondary | ICD-10-CM | POA: Diagnosis present

## 2022-10-08 DIAGNOSIS — Z880 Allergy status to penicillin: Secondary | ICD-10-CM | POA: Diagnosis not present

## 2022-10-08 DIAGNOSIS — J440 Chronic obstructive pulmonary disease with acute lower respiratory infection: Secondary | ICD-10-CM | POA: Diagnosis present

## 2022-10-08 DIAGNOSIS — Z888 Allergy status to other drugs, medicaments and biological substances status: Secondary | ICD-10-CM | POA: Diagnosis not present

## 2022-10-08 DIAGNOSIS — J45901 Unspecified asthma with (acute) exacerbation: Secondary | ICD-10-CM | POA: Diagnosis present

## 2022-10-08 DIAGNOSIS — I129 Hypertensive chronic kidney disease with stage 1 through stage 4 chronic kidney disease, or unspecified chronic kidney disease: Secondary | ICD-10-CM | POA: Diagnosis present

## 2022-10-08 DIAGNOSIS — Z90710 Acquired absence of both cervix and uterus: Secondary | ICD-10-CM | POA: Diagnosis not present

## 2022-10-08 DIAGNOSIS — J1100 Influenza due to unidentified influenza virus with unspecified type of pneumonia: Secondary | ICD-10-CM

## 2022-10-08 DIAGNOSIS — Z1152 Encounter for screening for COVID-19: Secondary | ICD-10-CM | POA: Diagnosis not present

## 2022-10-08 DIAGNOSIS — E785 Hyperlipidemia, unspecified: Secondary | ICD-10-CM | POA: Diagnosis present

## 2022-10-08 DIAGNOSIS — G9341 Metabolic encephalopathy: Secondary | ICD-10-CM | POA: Diagnosis present

## 2022-10-08 DIAGNOSIS — J441 Chronic obstructive pulmonary disease with (acute) exacerbation: Secondary | ICD-10-CM | POA: Diagnosis present

## 2022-10-08 DIAGNOSIS — Z8673 Personal history of transient ischemic attack (TIA), and cerebral infarction without residual deficits: Secondary | ICD-10-CM | POA: Diagnosis not present

## 2022-10-08 DIAGNOSIS — F17210 Nicotine dependence, cigarettes, uncomplicated: Secondary | ICD-10-CM | POA: Diagnosis present

## 2022-10-08 DIAGNOSIS — Z951 Presence of aortocoronary bypass graft: Secondary | ICD-10-CM | POA: Diagnosis not present

## 2022-10-08 DIAGNOSIS — I10 Essential (primary) hypertension: Secondary | ICD-10-CM

## 2022-10-08 HISTORY — DX: Influenza due to unidentified influenza virus with unspecified type of pneumonia: J11.00

## 2022-10-08 LAB — BASIC METABOLIC PANEL
Anion gap: 8 (ref 5–15)
BUN: 20 mg/dL (ref 8–23)
CO2: 25 mmol/L (ref 22–32)
Calcium: 9.1 mg/dL (ref 8.9–10.3)
Chloride: 111 mmol/L (ref 98–111)
Creatinine, Ser: 0.66 mg/dL (ref 0.44–1.00)
GFR, Estimated: >60 mL/min (ref >60)
Glucose, Bld: 316 mg/dL — ABNORMAL HIGH (ref 70–99)
Potassium: 3.4 mmol/L — ABNORMAL LOW (ref 3.5–5.1)
Sodium: 144 mmol/L (ref 135–145)

## 2022-10-08 LAB — IRON AND TIBC
Iron: 22 ug/dL — ABNORMAL LOW (ref 28–170)
Saturation Ratios: 5 % — ABNORMAL LOW (ref 10.4–31.8)
TIBC: 458 ug/dL — ABNORMAL HIGH (ref 250–450)
UIBC: 436 ug/dL

## 2022-10-08 LAB — CBC
HCT: 33.1 % — ABNORMAL LOW (ref 36.0–46.0)
Hemoglobin: 9.4 g/dL — ABNORMAL LOW (ref 12.0–15.0)
MCH: 21.8 pg — ABNORMAL LOW (ref 26.0–34.0)
MCHC: 28.4 g/dL — ABNORMAL LOW (ref 30.0–36.0)
MCV: 76.8 fL — ABNORMAL LOW (ref 80.0–100.0)
Platelets: 329 10*3/uL (ref 150–400)
RBC: 4.31 MIL/uL (ref 3.87–5.11)
RDW: 19.8 % — ABNORMAL HIGH (ref 11.5–15.5)
WBC: 7.1 10*3/uL (ref 4.0–10.5)
nRBC: 0 % (ref 0.0–0.2)

## 2022-10-08 LAB — GLUCOSE, CAPILLARY
Glucose-Capillary: 185 mg/dL — ABNORMAL HIGH (ref 70–99)
Glucose-Capillary: 144 mg/dL — ABNORMAL HIGH (ref 70–99)

## 2022-10-08 LAB — CBG MONITORING, ED
Glucose-Capillary: 257 mg/dL — ABNORMAL HIGH (ref 70–99)
Glucose-Capillary: 206 mg/dL — ABNORMAL HIGH (ref 70–99)

## 2022-10-08 LAB — LACTIC ACID, PLASMA: Lactic Acid, Venous: 1.5 mmol/L (ref 0.5–1.9)

## 2022-10-08 MED ORDER — LEVOFLOXACIN 750 MG PO TABS
750.0000 mg | ORAL_TABLET | Freq: Every day | ORAL | Status: DC
  Administered 2022-10-08 – 2022-10-09 (×2): 750 mg via ORAL
  Filled 2022-10-08: qty 2
  Filled 2022-10-08: qty 1

## 2022-10-08 MED ORDER — FERROUS SULFATE 325 (65 FE) MG PO TABS
325.0000 mg | ORAL_TABLET | Freq: Two times a day (BID) | ORAL | Status: DC
  Administered 2022-10-08 – 2022-10-09 (×3): 325 mg via ORAL
  Filled 2022-10-08 (×3): qty 1

## 2022-10-08 NOTE — Assessment & Plan Note (Signed)
Procalcitonin at 0.21 with some patchy infiltrate on imaging. -Start her on Levaquin for 5 days

## 2022-10-08 NOTE — Hospital Course (Addendum)
Taken from H&P.  Anne Brennan is a 61 y.o. 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  History obtained partially from patient and through chart review due to altered mental status. patient was experiencing a runny nose, fatigue and generalized weakness in addition to confusion. Apparently a family member had similar symptoms but not as severe.   ED course: On arrival to the ED, patient was afebrile at 99.3 with blood pressure 137/87 heart rate of 116.  She was saturating in the low to mid 80s with improvement to 98% on 2 L of supplemental oxygen.  Initial workup remarkable for potassium of 3.2, glucose of 273, hemoglobin of 9.9, and  influenza A PCR positive.  Chest x-ray with possible patchy airspace disease in the left base.  Patient was started on Tamiflu.   12/20: Hemodynamically stable with mildly elevated blood pressure at 151/89.  Iron studies with iron deficiency anemia.  Procalcitonin at 0.21, blood cultures negative in 24 hours.  Starting her on p.o. Levaquin for 5 days. Patient still lethargic and confused as compared to baseline.  Stating that he uses home oxygen but according to son she uses nebulizer at home and no current oxygen.  12/21: Patient remained stable.  Saturating well on room air.  Mentation at baseline and she is alert and oriented x 4.  She would like to go home.  She is being discharged on 3 more days of Levaquin, Tamiflu and prednisone to complete the course. She was instructed to hold her home citalopram while taking Levaquin and then resume it.  She will continue on current medications and need to have a close follow-up with her primary care provider for further recommendations.

## 2022-10-08 NOTE — Progress Notes (Signed)
Progress Note   Patient: Anne Brennan:[id number] DOB: [birth year] DOA: 10/07/2022     0 DOS: the patient was seen and examined on 10/08/2022   Brief hospital course: Taken from H&P.  Anne Brennan is a 61 y.o. 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  History obtained partially from patient and through chart review due to altered mental status. patient was experiencing a runny nose, fatigue and generalized weakness in addition to confusion. Apparently a family member had similar symptoms but not as severe.   ED course: On arrival to the ED, patient was afebrile at 99.3 with blood pressure 137/87 heart rate of 116.  She was saturating in the low to mid 80s with improvement to 98% on 2 L of supplemental oxygen.  Initial workup remarkable for potassium of 3.2, glucose of 273, hemoglobin of 9.9, and  influenza A PCR positive.  Chest x-ray with possible patchy airspace disease in the left base.  Patient was started on Tamiflu.   12/20: Hemodynamically stable with mildly elevated blood pressure at 151/89.  Iron studies with iron deficiency anemia.  Procalcitonin at 0.21, blood cultures negative in 24 hours.  Starting her on p.o. Levaquin for 5 days. Patient still lethargic and confused as compared to baseline.  Stating that he uses home oxygen but according to son she uses nebulizer at home and no current oxygen.   Assessment and Plan: * Influenza Per chart review, patient presenting with 2-day history of cough, runny nose, generalized weakness and malaise.  She was requiring 2 L supplemental oxygen initially but this has improved at this time.  -Tamiflu, 5-day course - Will treat for COPD exacerbation given wheezing on examination as detailed below - Tessalon  Perles as needed for cough  Influenza and pneumonia Procalcitonin at 0.21 with some patchy infiltrate on imaging. -Start her on Levaquin for 5 days  Chronic obstructive pulmonary disease (COPD) ([place]) Patient with wheezing on examination, cough initially requiring supplemental oxygen to maintain oxygen saturations; oxygen saturation improved with DuoNebs.  Secondary to influenza,S/p Solu-Medrol 125 mg -Continue supplemental oxygen as needed to maintain oxygen saturation above 90% - DuoNebs every 6 hours - Continue prednisone 40 mg daily tomorrow to complete a 5-day course  Acute encephalopathy Patient with waxing and waning disorientation, likely delirium versus metabolic encephalopathy given underlying infection.  No focal findings to suggest CVA.  CT head obtained in the ED with no acute findings.  - Management of flu as noted above - Delirium precautions  Uncontrolled type 2 diabetes mellitus with hyperglycemia, with long-term current use of insulin ([place]) - Hold home medications - SSI, resistant - Semglee 20 mg nightly  Essential hypertension - Continue home medications  Chronic kidney disease, stage 3a ([place]) Renal function currently at baseline.  - Monitor renal function while admitted  Microcytic anemia CBC demonstrates new microcytic anemia.  No prior history.  No bleeding on examination noted.  Iron studies consistent with iron deficiency anemia. -Start her on iron supplement -Patient need outpatient colonoscopy to rule out underlying malignancy to cause of iron deficiency anemia in her age group.   Subjective: Patient was seen and examined today.  She was feeling weak, no nausea or vomiting.  Denies any chest pain or shortness of breath.  Physical Exam: Vitals:   10/08/22 1143 10/08/22 1422 10/08/22 1447 10/08/22  1637  BP:  116/71 128/81 117/71  Pulse:  89 90 97  Resp:  '20 17 18  '$ Temp: 98 F (36.7 C) 97.8 F (36.6 C) (!) 97.5 F (36.4 C) 98.6 F (37 C)   TempSrc: Axillary Axillary Oral Oral  SpO2:  97% 100% 97%  Weight:      Height:       General.  Lethargic lady, in no acute distress. Pulmonary.  Lungs clear bilaterally, normal respiratory effort. CV.  Regular rate and rhythm, no JVD, rub or murmur. Abdomen.  Soft, nontender, nondistended, BS positive. CNS.  Alert and oriented .  No focal neurologic deficit. Extremities.  No edema, no cyanosis, pulses intact and symmetrical. Psychiatry.  Judgment and insight appears normal.   Data Reviewed: Prior data reviewed  Family Communication: Discussed with son on phone  Disposition: Status is: Inpatient Remains inpatient appropriate because: Severity of illness  Planned Discharge Destination: Home  DVT prophylaxis.  Lovenox Time spent: 50 minutes  This record has been created using Systems analyst. Errors have been sought and corrected,but may not always be located. Such creation errors do not reflect on the standard of care.   Author: Lorella Nimrod, MD 10/08/2022 5:24 PM  For on call review [email or web address].

## 2022-10-08 NOTE — Consult Note (Addendum)
Pharmacy Antibiotic Note  Anne Brennan is a 61 y.o. female admitted on 10/07/2022 with pneumonia.  Pharmacy has been consulted for oral levaquin dosing.  Plan: Levaquin '750mg'$  PO q24 Patient concomitantly on citalopram. Continue to monitor EKG  Continue to monitor and dose adjust antibiotics according to renal function and indication   Height: 5' (152.4 cm) Weight: 81.6 kg (180 lb) IBW/kg (Calculated) : 45.5  Temp (24hrs), Avg:98.3 F (36.8 C), Min:97.9 F (36.6 C), Max:99.3 F (37.4 C)  Recent Labs  Lab 10/07/22 1342 10/07/22 1755 10/08/22 0428  WBC 6.9  --  7.1  CREATININE 0.73  --  0.66  LATICACIDVEN  --  2.0*  --     Estimated Creatinine Clearance: 69.8 mL/min (by C-G formula based on SCr of 0.66 mg/dL).    Allergies  Allergen Reactions   Ibuprofen Other (See Comments)    N/V/ salivate   Peach [Prunus Persica] Itching and Swelling    Mouth itches and swells   Penicillins Rash    Antimicrobials this admission: 12/19 Tamiflu > 12/20 Levaquin >  Microbiology results: 12/19 BCx: NGTD 12/19 Resp PCR: Pos influenza A  Thank you for allowing pharmacy to be a part of this patient's care.  Darrick Penna 10/08/2022 8:36 AM

## 2022-10-09 DIAGNOSIS — J111 Influenza due to unidentified influenza virus with other respiratory manifestations: Secondary | ICD-10-CM | POA: Diagnosis not present

## 2022-10-09 DIAGNOSIS — J1100 Influenza due to unidentified influenza virus with unspecified type of pneumonia: Secondary | ICD-10-CM | POA: Diagnosis not present

## 2022-10-09 DIAGNOSIS — R0902 Hypoxemia: Secondary | ICD-10-CM | POA: Diagnosis not present

## 2022-10-09 DIAGNOSIS — J45901 Unspecified asthma with (acute) exacerbation: Secondary | ICD-10-CM | POA: Diagnosis not present

## 2022-10-09 LAB — GLUCOSE, CAPILLARY: Glucose-Capillary: 92 mg/dL (ref 70–99)

## 2022-10-09 MED ORDER — PREDNISONE 20 MG PO TABS: 40.0000 mg | ORAL_TABLET | Freq: Every day | ORAL | 0 refills | Status: AC

## 2022-10-09 MED ORDER — OSELTAMIVIR PHOSPHATE 75 MG PO CAPS: 75.0000 mg | ORAL_CAPSULE | Freq: Two times a day (BID) | ORAL | 0 refills | Status: AC

## 2022-10-09 MED ORDER — CITALOPRAM HYDROBROMIDE 20 MG PO TABS: 20.0000 mg | ORAL_TABLET | ORAL | Status: AC

## 2022-10-09 MED ORDER — LEVOFLOXACIN 750 MG PO TABS: 750.0000 mg | ORAL_TABLET | Freq: Every day | ORAL | 0 refills | Status: AC

## 2022-10-09 MED ORDER — IPRATROPIUM-ALBUTEROL 0.5-2.5 (3) MG/3ML IN SOLN: 3.0000 mL | Freq: Four times a day (QID) | RESPIRATORY_TRACT | Status: DC | PRN

## 2022-10-09 NOTE — TOC CM/SW Note (Signed)
Pt has orders to DC home today. Chart reviewed no TOC needs identified.

## 2022-10-09 NOTE — Discharge Summary (Signed)
Physician Discharge Summary   Patient: Anne Brennan MRN: [id number] DOB: 1961/04/24  Admit date:     10/07/2022  Discharge date: 10/09/22  Discharge Physician: Lorella Nimrod   PCP: Idelle Crouch, MD   Recommendations at discharge:  Please obtain CBC and BMP in 1 week Please ensure the completion of antibiotics, Tamiflu and prednisone. Follow-up with primary care provider within a week  Discharge Diagnoses: Principal Problem:   Influenza Active Problems:   Influenza and pneumonia   Chronic obstructive pulmonary disease (COPD) ([place])   Acute encephalopathy   Uncontrolled type 2 diabetes mellitus with hyperglycemia, with long-term current use of insulin ([facility])   Essential hypertension   Chronic kidney disease, stage 3a ([facility])   Microcytic anemia   Hypoxia   Reactive airway disease with acute exacerbation   Hospital Course: Taken from H&P.  Anne Brennan is a 61 y.o. 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  History obtained partially from patient and through chart review due to altered mental status. patient was experiencing a runny nose, fatigue and generalized weakness in addition to confusion. Apparently a family member had similar symptoms but not as severe.   ED course: On arrival to the ED, patient was afebrile at 99.3 with blood pressure 137/87 heart rate of 116.  She was saturating in the low to mid 80s with improvement to 98% on 2 L of supplemental oxygen.  Initial workup remarkable for potassium of 3.2, glucose of 273, hemoglobin of 9.9, and  influenza A PCR positive.  Chest x-ray with possible patchy airspace disease in the left base.  Patient was started on Tamiflu.   12/20: Hemodynamically stable with mildly elevated blood pressure at  151/89.  Iron studies with iron deficiency anemia.  Procalcitonin at 0.21, blood cultures negative in 24 hours.  Starting her on p.o. Levaquin for 5 days. Patient still lethargic and confused as compared to baseline.  Stating that he uses home oxygen but according to son she uses nebulizer at home and no current oxygen.  12/21: Patient remained stable.  Saturating well on room air.  Mentation at baseline and she is alert and oriented x 4.  She would like to go home.  She is being discharged on 3 more days of Levaquin, Tamiflu and prednisone to complete the course. She was instructed to hold her home citalopram while taking Levaquin and then resume it.  She will continue on current medications and need to have a close follow-up with her primary care provider for further recommendations.  Assessment and Plan: * Influenza Per chart review, patient presenting with 2-day history of cough, runny nose, generalized weakness and malaise.  She was requiring 2 L supplemental oxygen initially but this has improved at this time.  -Tamiflu, 5-day course - Will treat for COPD exacerbation given wheezing on examination as detailed below - Tessalon Perles as needed for cough  Influenza and pneumonia Procalcitonin at 0.21 with some patchy infiltrate on imaging. -Start her on Levaquin for 5 days  Chronic obstructive pulmonary disease (COPD) ([place]) Patient with wheezing on examination, cough initially requiring supplemental oxygen to maintain oxygen saturations; oxygen saturation improved with DuoNebs.  Secondary to influenza,S/p Solu-Medrol 125 mg -Continue supplemental oxygen as needed to maintain oxygen saturation above 90% - DuoNebs every 6 hours - Continue prednisone 40 mg daily tomorrow  to complete a 5-day course  Acute encephalopathy Patient with waxing and waning disorientation, likely delirium versus metabolic encephalopathy given underlying infection.  No focal findings to suggest CVA.  CT head  obtained in the ED with no acute findings.  - Management of flu as noted above - Delirium precautions  Uncontrolled type 2 diabetes mellitus with hyperglycemia, with long-term current use of insulin ([place]) - Hold home medications - SSI, resistant - Semglee 20 mg nightly  Essential hypertension - Continue home medications  Chronic kidney disease, stage 3a ([place]) Renal function currently at baseline.  - Monitor renal function while admitted  Microcytic anemia CBC demonstrates new microcytic anemia.  No prior history.  No bleeding on examination noted.  Iron studies consistent with iron deficiency anemia. -Start her on iron supplement -Patient need outpatient colonoscopy to rule out underlying malignancy to cause of iron deficiency anemia in her age group.    Pain control - [employer] Controlled Substance Reporting System database was reviewed. and patient was instructed, not to drive, operate heavy machinery, perform activities at heights, swimming or participation in water activities or provide baby-sitting services while on Pain, Sleep and Anxiety Medications; until their outpatient Physician has advised to do so again. Also recommended to not to take more than prescribed Pain, Sleep and Anxiety Medications.  Consultants: None Procedures performed: None Disposition: Home Diet recommendation:  Discharge Diet Orders (From admission, onward)     Start     Ordered   10/09/22 0000  Diet - low sodium heart healthy        10/09/22 1152           Cardiac and Carb modified diet DISCHARGE MEDICATION: Allergies as of 10/09/2022       Reactions   Ibuprofen Other (See Comments)   N/V/ salivate   Peach [prunus Persica] Itching, Swelling   Mouth itches and swells   Penicillins Rash        Medication List     TAKE these medications    albuterol 108 (90 Base) MCG/ACT inhaler Commonly known as: VENTOLIN HFA Inhale 2 puffs into the lungs every 4 (four) hours as needed for  shortness of breath or wheezing.   aspirin EC 325 MG tablet Take 1 tablet (325 mg total) by mouth daily.   atorvastatin 80 MG tablet Commonly known as: LIPITOR Take 80 mg by mouth daily.   Azelastine HCl 137 MCG/SPRAY Soln SMARTSIG:1-2 Spray(s) Both Nares Twice Daily   benzonatate 200 MG capsule Commonly known as: TESSALON Take 200 mg by mouth 3 (three) times daily as needed.   citalopram 20 MG tablet Commonly known as: CELEXA Take 1 tablet (20 mg total) by mouth every morning. Please hold until you finish your Levaquin What changed: additional instructions   ferrous sulfate 325 (65 FE) MG EC tablet Take 325 mg by mouth every morning.   fluticasone 50 MCG/ACT nasal spray Commonly known as: FLONASE Place 1 spray into both nostrils at bedtime.   furosemide 40 MG tablet Commonly known as: LASIX Take 40 mg by mouth daily.   gabapentin 400 MG capsule Commonly known as: NEURONTIN Take 800 mg by mouth 3 (three) times daily as needed.   HumuLIN N KwikPen 100 UNIT/ML Kiwkpen Generic drug: Insulin NPH (Human) ([place]) Inject 40 Units into the skin in the morning and at bedtime.   HYDROcodone-acetaminophen 10-325 MG tablet Commonly known as: NORCO Take 1 tablet by mouth 4 (four) times daily as needed.   levocetirizine 5 MG tablet Commonly  known as: XYZAL Take 5 mg by mouth every evening.   levofloxacin 750 MG tablet Commonly known as: LEVAQUIN Take 1 tablet (750 mg total) by mouth daily for 3 days. Start taking on: October 10, 2022   losartan 50 MG tablet Commonly known as: COZAAR Take 1 tablet (50 mg total) by mouth daily.   metFORMIN 500 MG 24 hr tablet Commonly known as: GLUCOPHAGE-XR Take 1,000 mg by mouth daily.   metoprolol tartrate 25 MG tablet Commonly known as: LOPRESSOR Take 25 mg by mouth 2 (two) times daily.   montelukast 10 MG tablet Commonly known as: SINGULAIR Take 10 mg by mouth at bedtime.   NovoLIN 70/30 (70-30) 100 UNIT/ML  injection Generic drug: insulin NPH-regular Human Inject 44 Units into the skin 2 (two) times daily.   omeprazole 20 MG capsule Commonly known as: PRILOSEC Take 20 mg by mouth 2 (two) times daily.   oseltamivir 75 MG capsule Commonly known as: TAMIFLU Take 1 capsule (75 mg total) by mouth 2 (two) times daily for 3 days.   predniSONE 20 MG tablet Commonly known as: DELTASONE Take 2 tablets (40 mg total) by mouth daily with breakfast for 3 days. Start taking on: October 10, 2022        Follow-up Information     Sparks, Leonie Douglas, MD. Schedule an appointment as soon as possible for a visit in 1 week(s).   Specialty: Internal Medicine Contact information: [place] [address] [phone]                Discharge Exam: Danley Danker Weights   10/07/22 1335  Weight: 81.6 kg   General.     In no acute distress. Pulmonary.  Few basal rhonchi bilaterally, normal respiratory effort. CV.  Regular rate and rhythm, no JVD, rub or murmur. Abdomen.  Soft, nontender, nondistended, BS positive. CNS.  Alert and oriented .  No focal neurologic deficit. Extremities.  No edema, no cyanosis, pulses intact and symmetrical. Psychiatry.  Judgment and insight appears normal.   Condition at discharge: stable  The results of significant diagnostics from this hospitalization (including imaging, microbiology, ancillary and laboratory) are listed below for reference.   Imaging Studies: CT Head Wo Contrast  Result Date: 10/07/2022 CLINICAL DATA:  Mental status change, unknown cause EXAM: CT HEAD WITHOUT CONTRAST TECHNIQUE: Contiguous axial images were obtained from the base of the skull through the vertex without intravenous contrast. RADIATION DOSE REDUCTION: This exam was performed according to the departmental dose-optimization program which includes automated exposure control, adjustment of the mA and/or kV according to patient size and/or use of  iterative reconstruction technique. COMPARISON:  05/19/2021. FINDINGS: Brain: No evidence of acute infarction, hemorrhage, hydrocephalus, extra-axial collection or mass lesion/mass effect. Vascular: No hyperdense vessel or unexpected calcification. Skull: No acute fracture. Sinuses/Orbits: No substantial paranasal sinus mucosal thickening. No acute orbital findings. IMPRESSION: No evidence of acute intracranial abnormality. Electronically Signed   By: Margaretha Sheffield M.D.   On: 10/07/2022 14:17   DG Chest 2 View  Result Date: 10/07/2022 CLINICAL DATA:  Cough. EXAM: CHEST - 2 VIEW COMPARISON:  05/30/2022 FINDINGS: The cardio pericardial silhouette is enlarged. Interstitial markings are diffusely coarsened with chronic features. Patchy airspace disease is seen at the left base compatible with pneumonia. Tiny pleural effusion is noted on the right. The visualized bony structures of the thorax are unremarkable. IMPRESSION: 1. Patchy airspace disease at the left base compatible with pneumonia. 2. Tiny right pleural effusion. Electronically  Signed   By: Misty Stanley M.D.   On: 10/07/2022 13:58    Microbiology: Results for orders placed or performed during the hospital encounter of 10/07/22  Resp panel by RT-PCR (RSV, Flu A&B, Covid) Anterior Nasal Swab     Status: Abnormal   Collection Time: 10/07/22  1:42 PM   Specimen: Anterior Nasal Swab  Result Value Ref Range Status   SARS Coronavirus 2 by RT PCR NEGATIVE NEGATIVE Final    Comment: (NOTE) SARS-CoV-2 target nucleic acids are NOT DETECTED.  The SARS-CoV-2 RNA is generally detectable in upper respiratory specimens during the acute phase of infection. The lowest concentration of SARS-CoV-2 viral copies this assay can detect is 138 copies/mL. A negative result does not preclude SARS-Cov-2 infection and should not be used as the sole basis for treatment or other patient management decisions. A negative result may occur with  improper specimen  collection/handling, submission of specimen other than nasopharyngeal swab, presence of viral mutation(s) within the areas targeted by this assay, and inadequate number of viral copies(<138 copies/mL). A negative result must be combined with clinical observations, patient history, and epidemiological information. The expected result is Negative.  Fact Sheet for Patients:  EntrepreneurPulse.com.au  Fact Sheet for Healthcare Providers:  IncredibleEmployment.be  This test is no t yet approved or cleared by the [place] FDA and  has been authorized for detection and/or diagnosis of SARS-CoV-2 by FDA under an Emergency Use Authorization (EUA). This EUA will remain  in effect (meaning this test can be used) for the duration of the COVID-19 declaration under Section 564(b)(1) of the Act, 21 U.S.C.section 360bbb-3(b)(1), unless the authorization is terminated  or revoked sooner.       Influenza A by PCR POSITIVE (A) NEGATIVE Final   Influenza B by PCR NEGATIVE NEGATIVE Final    Comment: (NOTE) The Xpert Xpress SARS-CoV-2/FLU/RSV plus assay is intended as an aid in the diagnosis of influenza from Nasopharyngeal swab specimens and should not be used as a sole basis for treatment. Nasal washings and aspirates are unacceptable for Xpert Xpress SARS-CoV-2/FLU/RSV testing.  Fact Sheet for Patients: EntrepreneurPulse.com.au  Fact Sheet for Healthcare Providers: IncredibleEmployment.be  This test is not yet approved or cleared by the [place] FDA and has been authorized for detection and/or diagnosis of SARS-CoV-2 by FDA under an Emergency Use Authorization (EUA). This EUA will remain in effect (meaning this test can be used) for the duration of the COVID-19 declaration under Section 564(b)(1) of the Act, 21 U.S.C. section 360bbb-3(b)(1), unless the authorization is terminated or revoked.     Resp Syncytial  Virus by PCR NEGATIVE NEGATIVE Final    Comment: (NOTE) Fact Sheet for Patients: EntrepreneurPulse.com.au  Fact Sheet for Healthcare Providers: IncredibleEmployment.be  This test is not yet approved or cleared by the [place] FDA and has been authorized for detection and/or diagnosis of SARS-CoV-2 by FDA under an Emergency Use Authorization (EUA). This EUA will remain in effect (meaning this test can be used) for the duration of the COVID-19 declaration under Section 564(b)(1) of the Act, 21 U.S.C. section 360bbb-3(b)(1), unless the authorization is terminated or revoked.  Performed at [facility], [address] [place], [place] [address]   Blood culture (routine x 2)     Status: None (Preliminary result)   Collection Time: 10/07/22  5:55 PM   Specimen: BLOOD  Result Value Ref Range Status   Specimen Description BLOOD BLOOD LEFT HAND  Final   Special Requests   Final  BOTTLES DRAWN AEROBIC AND ANAEROBIC Blood Culture adequate volume   Culture   Final    NO GROWTH 2 DAYS Performed at [facility], [address] [place], [place] [address]    Report Status PENDING  Incomplete  Blood culture (routine x 2)     Status: None (Preliminary result)   Collection Time: 10/07/22  5:55 PM   Specimen: BLOOD  Result Value Ref Range Status   Specimen Description BLOOD BLOOD LEFT ARM  Final   Special Requests   Final    BOTTLES DRAWN AEROBIC AND ANAEROBIC Blood Culture results may not be optimal due to an inadequate volume of blood received in culture bottles   Culture   Final    NO GROWTH 2 DAYS Performed at [facility], [address] [place], [place] [address]    Report Status PENDING  Incomplete    Labs: CBC: Recent Labs  Lab 10/07/22 1342 10/08/22 0428  WBC 6.9 7.1  NEUTROABS 5.5  --   HGB 9.9* 9.4*  HCT 34.0* 33.1*  MCV 76.7* 76.8*  PLT 333 268   Basic Metabolic Panel: Recent Labs  Lab  10/07/22 1342 10/08/22 0428  NA 145 144  K 3.2* 3.4*  CL 108 111  CO2 25 25  GLUCOSE 273* 316*  BUN 15 20  CREATININE 0.73 0.66  CALCIUM 9.8 9.1   Liver Function Tests: No results for input(s): "AST", "ALT", "ALKPHOS", "BILITOT", "PROT", "ALBUMIN" in the last 168 hours. CBG: Recent Labs  Lab 10/08/22 0758 10/08/22 1133 10/08/22 1614 10/08/22 2125 10/09/22 0738  GLUCAP 257* 206* 185* 144* 92    Discharge time spent: greater than 30 minutes.  This record has been created using Systems analyst. Errors have been sought and corrected,but may not always be located. Such creation errors do not reflect on the standard of care.   Signed: Lorella Nimrod, MD [facility] 10/09/2022

## 2022-10-09 NOTE — Plan of Care (Signed)
  Problem: Education: Goal: Individualized Educational Video(s) Outcome: Progressing   Problem: Coping: Goal: Ability to adjust to condition or change in health will improve Outcome: Progressing   Problem: Fluid Volume: Goal: Ability to maintain a balanced intake and output will improve Outcome: Progressing

## 2022-10-09 NOTE — Plan of Care (Signed)

## 2022-10-12 LAB — CULTURE, BLOOD (ROUTINE X 2)
Culture: NO GROWTH
Culture: NO GROWTH
Special Requests: ADEQUATE

## 2023-03-09 ENCOUNTER — Other Ambulatory Visit: Payer: Self-pay | Admitting: Internal Medicine

## 2023-03-09 DIAGNOSIS — R053 Chronic cough: Secondary | ICD-10-CM

## 2023-03-18 ENCOUNTER — Ambulatory Visit
Admission: RE | Admit: 2023-03-18 | Discharge: 2023-03-18 | Disposition: A | Discharge status: Home / Self Care | Payer: 59 | Source: Ambulatory Visit | Attending: Internal Medicine | Admitting: Internal Medicine

## 2023-03-18 DIAGNOSIS — R053 Chronic cough: Secondary | ICD-10-CM | POA: Diagnosis present

## 2023-04-07 ENCOUNTER — Other Ambulatory Visit: Payer: Self-pay | Admitting: Specialist

## 2023-04-07 DIAGNOSIS — E049 Nontoxic goiter, unspecified: Secondary | ICD-10-CM

## 2023-04-17 ENCOUNTER — Ambulatory Visit
Admission: RE | Admit: 2023-04-17 | Discharge: 2023-04-17 | Disposition: A | Discharge status: Home / Self Care | Payer: 59 | Source: Ambulatory Visit | Attending: Specialist | Admitting: Specialist

## 2023-04-17 DIAGNOSIS — E049 Nontoxic goiter, unspecified: Secondary | ICD-10-CM | POA: Insufficient documentation

## 2023-06-09 ENCOUNTER — Other Ambulatory Visit: Payer: Self-pay | Admitting: Internal Medicine

## 2023-06-09 DIAGNOSIS — Z1231 Encounter for screening mammogram for malignant neoplasm of breast: Secondary | ICD-10-CM

## 2023-07-03 IMAGING — US US CAROTID DUPLEX BILAT
1 series · 13 of 24 positions shown · non-contrast
Comparison: None.

CLINICAL DATA: TIA

Hypertension
Diabetes
EXAM:
BILATERAL CAROTID DUPLEX ULTRASOUND
TECHNIQUE: Gray scale imaging, color Doppler and duplex ultrasound were
performed of bilateral carotid and vertebral arteries in the neck.

[Series 1: us carotid bilateral · 13 of 76 slices shown]
[im 1/76]
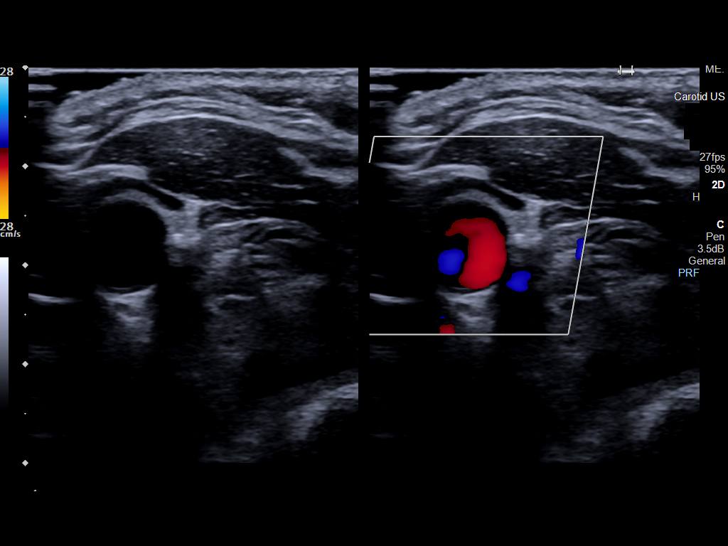
[im 7/76]
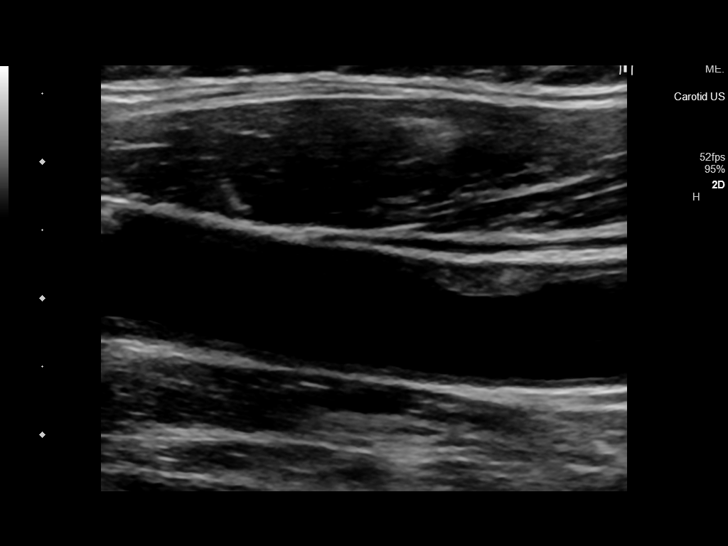
[im 14/76]
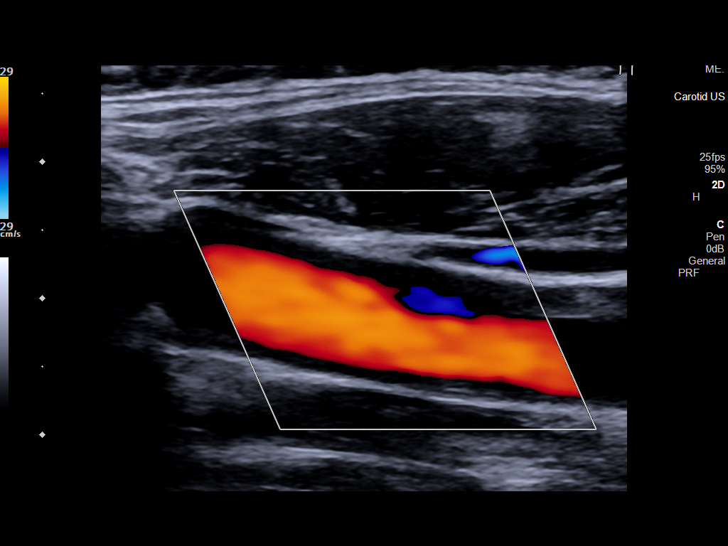
[im 20/76]
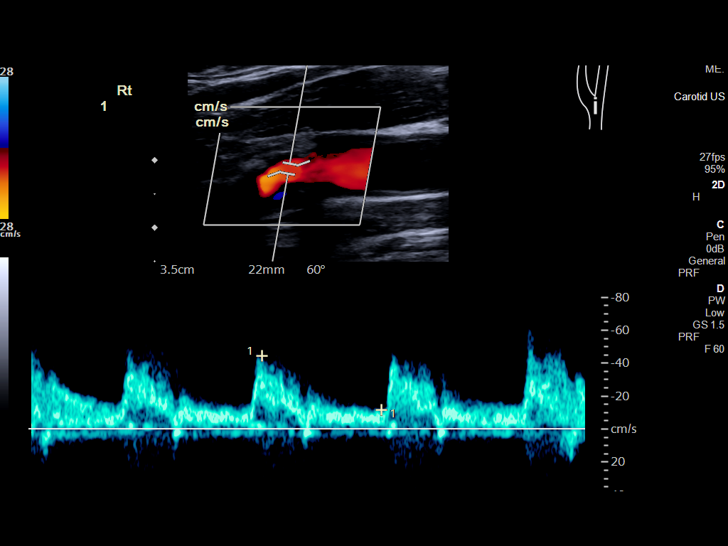
[im 27/76]
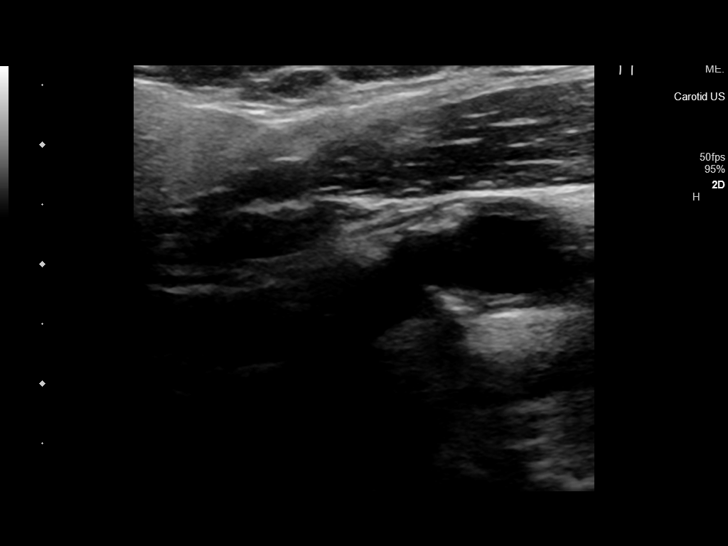
[im 33/76]
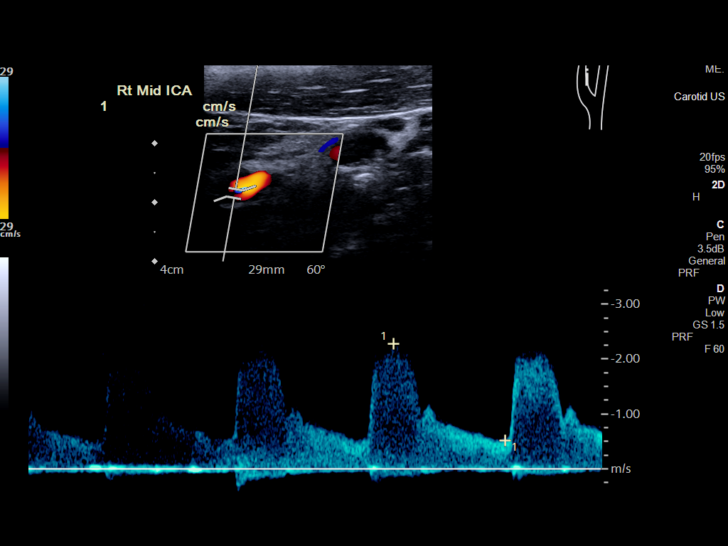
[im 40/76]
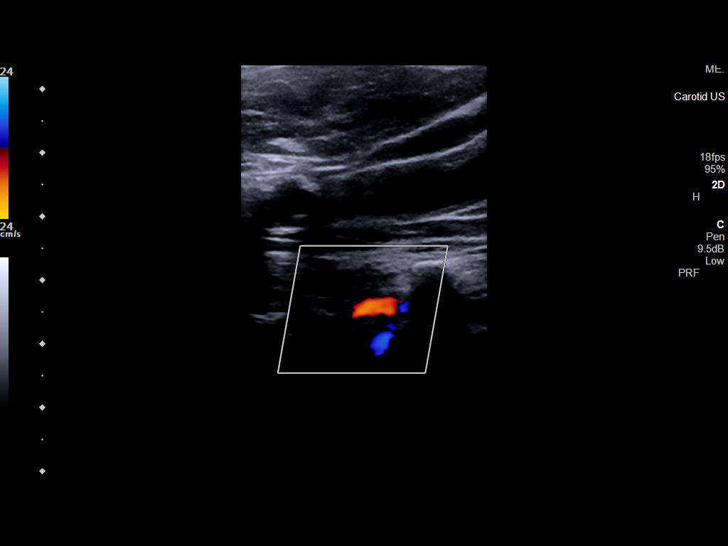
[im 43/76]
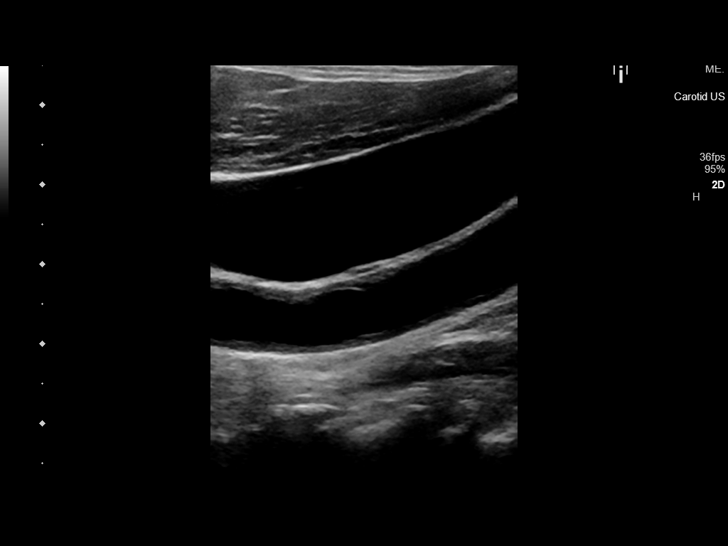
[im 49/76]
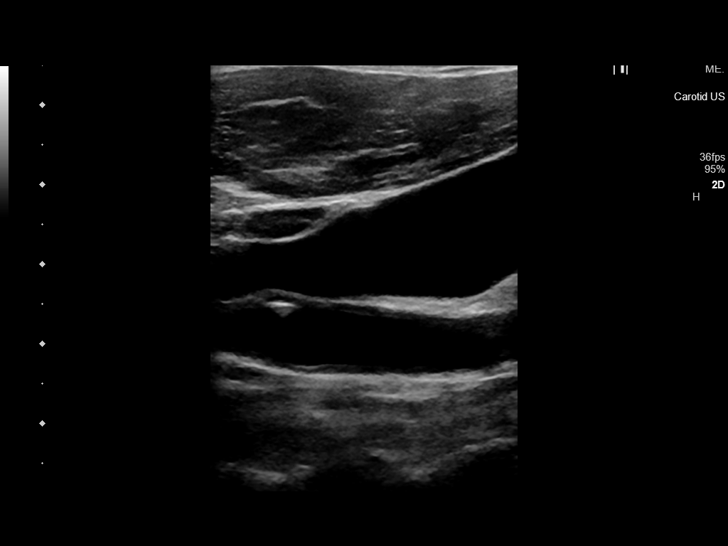
[im 56/76]
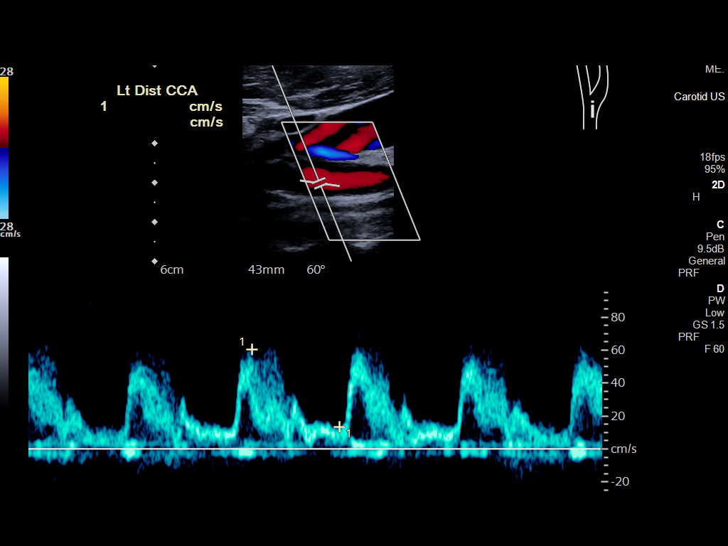
[im 62/76]
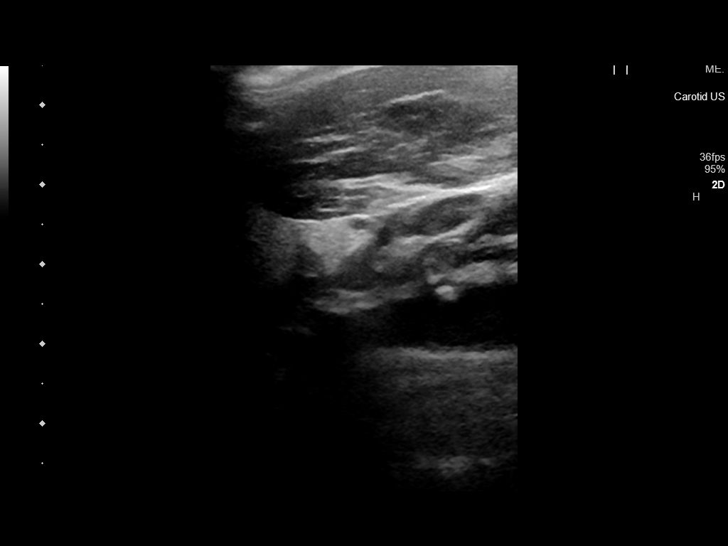
[im 69/76]
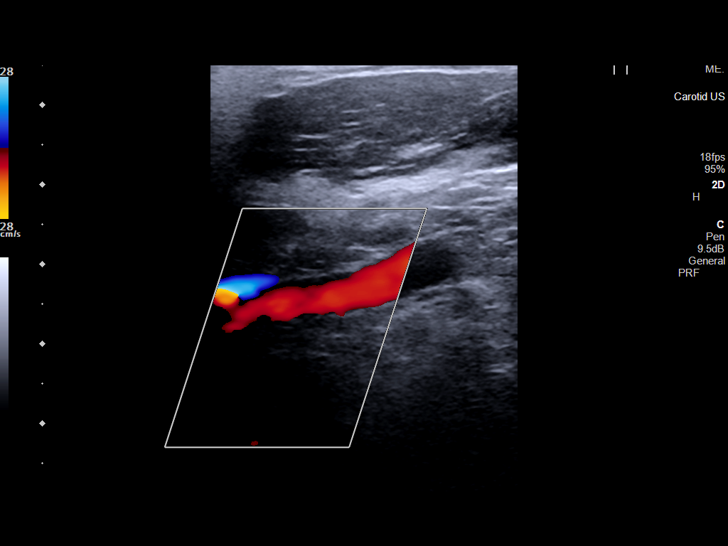
[im 76/76]
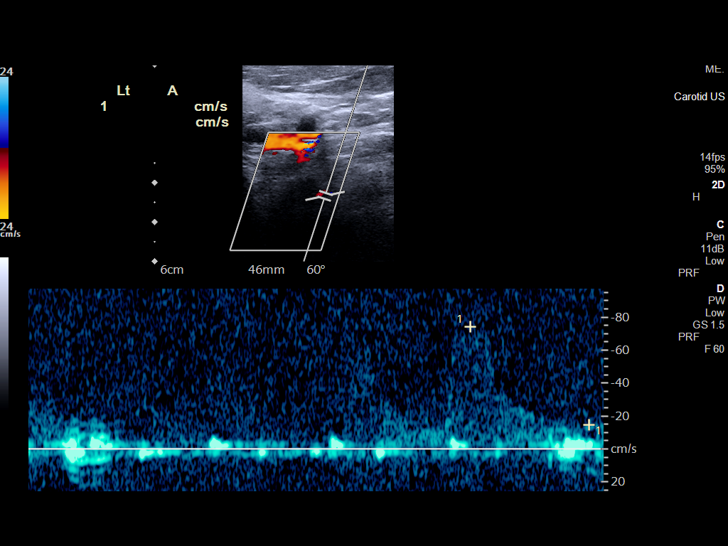

[13 of 24 positions shown; findings below may reference images not displayed]

FINDINGS: Criteria: Quantification of carotid stenosis is based on velocity
parameters that correlate the residual internal carotid diameter
with NASCET-based stenosis levels, using the diameter of the distal
internal carotid lumen as the denominator for stenosis measurement.

The following velocity measurements were obtained:

RIGHT

ICA: 228/52 cm/sec

CCA: 89/50 cm/sec

SYSTOLIC ICA/CCA RATIO:

ECA: 96 cm/sec

LEFT

ICA: 105/28 cm/sec

CCA: 71/16 cm/sec

SYSTOLIC ICA/CCA RATIO:

ECA: 88 cm/sec

RIGHT CAROTID ARTERY: Moderate heterogeneous plaque of the carotid
bifurcation with Doppler parameters indicative of 50-69% stenosis.

RIGHT VERTEBRAL ARTERY:  Antegrade flow.

LEFT CAROTID ARTERY: Mild heterogeneous plaque at the carotid
bifurcation with Doppler parameters indicative of less than 50%
stenosis.

LEFT VERTEBRAL ARTERY:  Antegrade flow.
IMPRESSION: 1. 50-69% stenosis of the right carotid artery.
2. Less than 50% stenosis of the left internal carotid artery.

## 2023-07-09 ENCOUNTER — Other Ambulatory Visit: Payer: Self-pay

## 2023-07-09 ENCOUNTER — Emergency Department: Payer: 59

## 2023-07-09 ENCOUNTER — Observation Stay
Admission: EM | Admit: 2023-07-09 | Discharge: 2023-07-10 | Disposition: A | Discharge status: Home / Self Care | Payer: 59 | Attending: Family Medicine | Admitting: Family Medicine

## 2023-07-09 DIAGNOSIS — E1122 Type 2 diabetes mellitus with diabetic chronic kidney disease: Secondary | ICD-10-CM | POA: Insufficient documentation

## 2023-07-09 DIAGNOSIS — M797 Fibromyalgia: Secondary | ICD-10-CM | POA: Diagnosis present

## 2023-07-09 DIAGNOSIS — I130 Hypertensive heart and chronic kidney disease with heart failure and stage 1 through stage 4 chronic kidney disease, or unspecified chronic kidney disease: Secondary | ICD-10-CM | POA: Diagnosis not present

## 2023-07-09 DIAGNOSIS — I2510 Atherosclerotic heart disease of native coronary artery without angina pectoris: Secondary | ICD-10-CM | POA: Diagnosis not present

## 2023-07-09 DIAGNOSIS — Z1152 Encounter for screening for COVID-19: Secondary | ICD-10-CM | POA: Diagnosis not present

## 2023-07-09 DIAGNOSIS — J449 Chronic obstructive pulmonary disease, unspecified: Secondary | ICD-10-CM | POA: Diagnosis not present

## 2023-07-09 DIAGNOSIS — Z8673 Personal history of transient ischemic attack (TIA), and cerebral infarction without residual deficits: Secondary | ICD-10-CM | POA: Diagnosis not present

## 2023-07-09 DIAGNOSIS — Z7982 Long term (current) use of aspirin: Secondary | ICD-10-CM | POA: Insufficient documentation

## 2023-07-09 DIAGNOSIS — E8770 Fluid overload, unspecified: Principal | ICD-10-CM | POA: Insufficient documentation

## 2023-07-09 DIAGNOSIS — F17210 Nicotine dependence, cigarettes, uncomplicated: Secondary | ICD-10-CM | POA: Insufficient documentation

## 2023-07-09 DIAGNOSIS — R0602 Shortness of breath: Secondary | ICD-10-CM | POA: Diagnosis not present

## 2023-07-09 DIAGNOSIS — N1831 Chronic kidney disease, stage 3a: Secondary | ICD-10-CM | POA: Diagnosis not present

## 2023-07-09 DIAGNOSIS — I5032 Chronic diastolic (congestive) heart failure: Secondary | ICD-10-CM | POA: Diagnosis not present

## 2023-07-09 DIAGNOSIS — I5031 Acute diastolic (congestive) heart failure: Secondary | ICD-10-CM | POA: Diagnosis not present

## 2023-07-09 DIAGNOSIS — E1142 Type 2 diabetes mellitus with diabetic polyneuropathy: Secondary | ICD-10-CM | POA: Diagnosis not present

## 2023-07-09 DIAGNOSIS — F32A Depression, unspecified: Secondary | ICD-10-CM | POA: Diagnosis present

## 2023-07-09 DIAGNOSIS — R0789 Other chest pain: Secondary | ICD-10-CM | POA: Diagnosis present

## 2023-07-09 DIAGNOSIS — Z7984 Long term (current) use of oral hypoglycemic drugs: Secondary | ICD-10-CM | POA: Insufficient documentation

## 2023-07-09 DIAGNOSIS — E785 Hyperlipidemia, unspecified: Secondary | ICD-10-CM | POA: Diagnosis not present

## 2023-07-09 DIAGNOSIS — J45909 Unspecified asthma, uncomplicated: Secondary | ICD-10-CM | POA: Insufficient documentation

## 2023-07-09 DIAGNOSIS — I509 Heart failure, unspecified: Secondary | ICD-10-CM

## 2023-07-09 DIAGNOSIS — I10 Essential (primary) hypertension: Secondary | ICD-10-CM | POA: Diagnosis present

## 2023-07-09 LAB — CBC
HCT: 39.6 % (ref 36.0–46.0)
Hemoglobin: 12.4 g/dL (ref 12.0–15.0)
MCH: 28.7 pg (ref 26.0–34.0)
MCHC: 31.3 g/dL (ref 30.0–36.0)
MCV: 91.7 fL (ref 80.0–100.0)
Platelets: 261 10*3/uL (ref 150–400)
RBC: 4.32 MIL/uL (ref 3.87–5.11)
RDW: 14.8 % (ref 11.5–15.5)
WBC: 6.4 10*3/uL (ref 4.0–10.5)
nRBC: 0.6 % — ABNORMAL HIGH (ref 0.0–0.2)

## 2023-07-09 LAB — RESP PANEL BY RT-PCR (RSV, FLU A&B, COVID)  RVPGX2
Influenza A by PCR: NEGATIVE
Influenza B by PCR: NEGATIVE
Resp Syncytial Virus by PCR: NEGATIVE
SARS Coronavirus 2 by RT PCR: NEGATIVE

## 2023-07-09 LAB — TROPONIN I (HIGH SENSITIVITY)
Troponin I (High Sensitivity): 57 ng/L — ABNORMAL HIGH (ref <18)
Troponin I (High Sensitivity): 70 ng/L — ABNORMAL HIGH (ref <18)

## 2023-07-09 LAB — BASIC METABOLIC PANEL
Anion gap: 8 (ref 5–15)
BUN: 11 mg/dL (ref 8–23)
CO2: 31 mmol/L (ref 22–32)
Calcium: 8.8 mg/dL — ABNORMAL LOW (ref 8.9–10.3)
Chloride: 98 mmol/L (ref 98–111)
Creatinine, Ser: 0.73 mg/dL (ref 0.44–1.00)
GFR, Estimated: >60 mL/min (ref >60)
Glucose, Bld: 125 mg/dL — ABNORMAL HIGH (ref 70–99)
Potassium: 3.4 mmol/L — ABNORMAL LOW (ref 3.5–5.1)
Sodium: 137 mmol/L (ref 135–145)

## 2023-07-09 LAB — BRAIN NATRIURETIC PEPTIDE: B Natriuretic Peptide: 586.1 pg/mL — ABNORMAL HIGH (ref 0.0–100.0)

## 2023-07-09 MED ORDER — LOSARTAN POTASSIUM 50 MG PO TABS: 50.0000 mg | ORAL_TABLET | Freq: Every day | ORAL | Status: DC

## 2023-07-09 MED ORDER — MAGNESIUM HYDROXIDE 400 MG/5ML PO SUSP: 30.0000 mL | Freq: Every day | ORAL | Status: DC | PRN

## 2023-07-09 MED ORDER — INSULIN NPH (HUMAN) (ISOPHANE) 100 UNIT/ML ~~LOC~~ SUSP
20.0000 [IU] | Freq: Every day | SUBCUTANEOUS | Status: DC
  Filled 2023-07-09: qty 10

## 2023-07-09 MED ORDER — ONDANSETRON HCL 4 MG/2ML IJ SOLN: 4.0000 mg | Freq: Four times a day (QID) | INTRAMUSCULAR | Status: DC | PRN

## 2023-07-09 MED ORDER — TRAZODONE HCL 50 MG PO TABS: 25.0000 mg | ORAL_TABLET | Freq: Every evening | ORAL | Status: DC | PRN

## 2023-07-09 MED ORDER — ASPIRIN 325 MG PO TBEC: 325.0000 mg | DELAYED_RELEASE_TABLET | Freq: Every day | ORAL | Status: DC

## 2023-07-09 MED ORDER — MORPHINE SULFATE (PF) 2 MG/ML IV SOLN
2.0000 mg | Freq: Once | INTRAVENOUS | Status: AC
  Administered 2023-07-09: 2 mg via INTRAVENOUS
  Filled 2023-07-09: qty 1

## 2023-07-09 MED ORDER — METOPROLOL TARTRATE 25 MG PO TABS
25.0000 mg | ORAL_TABLET | Freq: Two times a day (BID) | ORAL | Status: DC
  Administered 2023-07-10: 25 mg via ORAL
  Filled 2023-07-09: qty 1

## 2023-07-09 MED ORDER — IOHEXOL 350 MG/ML SOLN
75.0000 mL | Freq: Once | INTRAVENOUS | Status: AC | PRN
  Administered 2023-07-09: 75 mL via INTRAVENOUS

## 2023-07-09 MED ORDER — GABAPENTIN 400 MG PO CAPS
800.0000 mg | ORAL_CAPSULE | Freq: Three times a day (TID) | ORAL | Status: DC | PRN
  Administered 2023-07-10: 800 mg via ORAL
  Filled 2023-07-09 (×2): qty 2

## 2023-07-09 MED ORDER — ENOXAPARIN SODIUM 40 MG/0.4ML IJ SOSY: 40.0000 mg | PREFILLED_SYRINGE | INTRAMUSCULAR | Status: DC

## 2023-07-09 MED ORDER — ALBUTEROL SULFATE (2.5 MG/3ML) 0.083% IN NEBU: 3.0000 mL | INHALATION_SOLUTION | RESPIRATORY_TRACT | Status: DC | PRN

## 2023-07-09 MED ORDER — ACETAMINOPHEN 325 MG PO TABS: 650.0000 mg | ORAL_TABLET | Freq: Four times a day (QID) | ORAL | Status: DC | PRN

## 2023-07-09 MED ORDER — FERROUS SULFATE 325 (65 FE) MG PO TABS: 325.0000 mg | ORAL_TABLET | Freq: Every morning | ORAL | Status: DC

## 2023-07-09 MED ORDER — FUROSEMIDE 10 MG/ML IJ SOLN
40.0000 mg | Freq: Once | INTRAMUSCULAR | Status: AC
  Administered 2023-07-09: 40 mg via INTRAVENOUS
  Filled 2023-07-09: qty 4

## 2023-07-09 MED ORDER — CITALOPRAM HYDROBROMIDE 20 MG PO TABS: 20.0000 mg | ORAL_TABLET | Freq: Every day | ORAL | Status: DC

## 2023-07-09 MED ORDER — PANTOPRAZOLE SODIUM 40 MG PO TBEC: 40.0000 mg | DELAYED_RELEASE_TABLET | Freq: Every day | ORAL | Status: DC

## 2023-07-09 MED ORDER — FUROSEMIDE 10 MG/ML IJ SOLN: 40.0000 mg | Freq: Two times a day (BID) | INTRAMUSCULAR | Status: DC

## 2023-07-09 MED ORDER — ONDANSETRON HCL 4 MG PO TABS: 4.0000 mg | ORAL_TABLET | Freq: Four times a day (QID) | ORAL | Status: DC | PRN

## 2023-07-09 MED ORDER — ACETAMINOPHEN 650 MG RE SUPP: 650.0000 mg | Freq: Four times a day (QID) | RECTAL | Status: DC | PRN

## 2023-07-09 NOTE — ED Triage Notes (Signed)
Pt to ED For intermittent chest pain for a couple days, worsening with cough, expiration and movement.

## 2023-07-09 NOTE — ED Provider Notes (Signed)
[facility] Provider Note    Event Date/Time   First MD Initiated Contact with Patient 07/09/23 1848     (approximate)   History   Chest Pain   HPI Anne Brennan is a 62 y.o. female with HTN, DM2, chronic smoking history, CKD stage III presenting today for chest pain.  Patient notes 4 to 5 days of right sided chest pain associated with movement as well as deep breaths.  Feels like it is gotten worse over the past couple of days.  Denies any other chest pain or radiation of the pain.  Does note some shortness of breath with ambulation.  Denies fevers, chills, cough, congestion, abdominal pain, nausea, vomiting.    Patient had admission to hospital approximately 1 year ago for unstable angina with elevated troponins requiring CABG x 2.  Does not feel like this is similar to her prior chest pain symptoms.    Physical Exam   Triage Vital Signs: ED Triage Vitals  Encounter Vitals Group     BP 07/09/23 1638 (!) 137/92     Systolic BP Percentile --      Diastolic BP Percentile --      Pulse Rate 07/09/23 1638 (!) 101     Resp 07/09/23 1638 20     Temp 07/09/23 1638 98.1 F (36.7 C)     Temp src --      SpO2 07/09/23 1638 93 %     Weight 07/09/23 1639 154 lb (69.9 kg)     Height 07/09/23 1639 5\' 4"  (1.626 m)     Head Circumference --      Peak Flow --      Pain Score 07/09/23 1639 9     Pain Loc --      Pain Education --      Exclude from Growth Chart --     Most recent vital signs: Vitals:   07/09/23 2015 07/09/23 2100  BP: (!) 143/91 (!) 159/98  Pulse: 98 99  Resp: 20 18  Temp:  98 F (36.7 C)  SpO2: 100% 98%   Physical Exam: I have reviewed the vital signs and nursing notes. General: Awake, alert, no acute distress.  Nontoxic appearing. Head:  Atraumatic, normocephalic.   ENT:  EOM intact, PERRL. Oral mucosa is pink and moist with no lesions. Neck: Neck is supple with full range of motion, No meningeal signs. Cardiovascular:  RRR,  No murmurs. Peripheral pulses palpable and equal bilaterally. Chest wall: Palpable tenderness to palpation along right side of chest wall. Respiratory:  Symmetrical chest wall expansion.  No rhonchi, rales, or wheezes.  Good air movement throughout.  No use of accessory muscles.   Musculoskeletal:  No cyanosis or edema. Moving extremities with full ROM Abdomen:  Soft, nontender, nondistended. Neuro:  GCS 15, moving all four extremities, interacting appropriately. Speech clear. Psych:  Calm, appropriate.   Skin:  Warm, dry, no rash.    ED Results / Procedures / Treatments   Labs (all labs ordered are listed, but only abnormal results are displayed) Labs Reviewed  BASIC METABOLIC PANEL - Abnormal; Notable for the following components:      Result Value   Potassium 3.4 (*)    Glucose, Bld 125 (*)    Calcium 8.8 (*)    All other components within normal limits  CBC - Abnormal; Notable for the following components:   nRBC 0.6 (*)    All other components within normal limits  BRAIN NATRIURETIC PEPTIDE -  Abnormal; Notable for the following components:   B Natriuretic Peptide 586.1 (*)    All other components within normal limits  TROPONIN I (HIGH SENSITIVITY) - Abnormal; Notable for the following components:   Troponin I (High Sensitivity) 57 (*)    All other components within normal limits  TROPONIN I (HIGH SENSITIVITY) - Abnormal; Notable for the following components:   Troponin I (High Sensitivity) 70 (*)    All other components within normal limits  RESP PANEL BY RT-PCR (RSV, FLU A&B, COVID)  RVPGX2  HIV ANTIBODY (ROUTINE TESTING W REFLEX)  BASIC METABOLIC PANEL  CBC     EKG My EKG interpretation: Rate of 98, normal sinus rhythm, normal axis.  Normal intervals.  No acute ST elevations or depressions   RADIOLOGY Independently interpreted chest x-ray with no acute pathology but radiologist does note small increasing right-sided pleural effusion.   PROCEDURES:  Critical  Care performed: No  Procedures   MEDICATIONS ORDERED IN ED: Medications  aspirin EC tablet 325 mg (has no administration in time range)  metoprolol tartrate (LOPRESSOR) tablet 25 mg (has no administration in time range)  losartan (COZAAR) tablet 50 mg (has no administration in time range)  citalopram (CELEXA) tablet 20 mg (has no administration in time range)  Insulin NPH (Human) (Isophane) (HUMULIN N) Kwikpen 40 Units (has no administration in time range)  pantoprazole (PROTONIX) EC tablet 40 mg (has no administration in time range)  ferrous sulfate EC tablet 325 mg (has no administration in time range)  gabapentin (NEURONTIN) capsule 800 mg (has no administration in time range)  albuterol (VENTOLIN HFA) 108 (90 Base) MCG/ACT inhaler 2 puff (has no administration in time range)  enoxaparin (LOVENOX) injection 40 mg (has no administration in time range)  furosemide (LASIX) injection 40 mg (has no administration in time range)  acetaminophen (TYLENOL) tablet 650 mg (has no administration in time range)    Or  acetaminophen (TYLENOL) suppository 650 mg (has no administration in time range)  traZODone (DESYREL) tablet 25 mg (has no administration in time range)  magnesium hydroxide (MILK OF MAGNESIA) suspension 30 mL (has no administration in time range)  ondansetron (ZOFRAN) tablet 4 mg (has no administration in time range)    Or  ondansetron (ZOFRAN) injection 4 mg (has no administration in time range)  morphine (PF) 2 MG/ML injection 2 mg (2 mg Intravenous Given 07/09/23 2027)  iohexol (OMNIPAQUE) 350 MG/ML injection 75 mL (75 mLs Intravenous Contrast Given 07/09/23 2033)  furosemide (LASIX) injection 40 mg (40 mg Intravenous Given 07/09/23 2142)     IMPRESSION / MDM / ASSESSMENT AND PLAN / ED COURSE  I reviewed the triage vital signs and the nursing notes.                              Differential diagnosis includes, but is not limited to, ACS, PE, CHF exacerbation, COVID,  pneumonia.  Patient's presentation is most consistent with acute presentation with potential threat to life or bodily function.  Patient is a 62 year old female with significant cardiac past medical history presenting today for chest pain and shortness of breath.  Initial EKG consistent with her baseline and reassuring.  First troponin elevated at 57 with repeat at 70 concerning for type II demand ischemia at this time.  BNP elevated at 586 and chest x-ray showing concern for slight volume overload.  Given tachycardia and pleuritic chest pain CTA of chest was ordered.  No evidence  of PE but does note pleural effusion on the right side.  Separately, notes some groundglass opacities which are new and patient does have a cough here.  Will test for COVID, flu, and pneumonia which resulted negative.  Suspect symptoms all related to volume overload.  Patient given 40 mg IV Lasix and admitted to hospitalist for further care.  The patient is on the cardiac monitor to evaluate for evidence of arrhythmia and/or significant heart rate changes. Clinical Course as of 07/09/23 2230  Thu Jul 09, 2023  2054 Troponin I (High Sensitivity)(!): 70 Uptrending troponins [DW]  2054 B Natriuretic Peptide(!): 586.1 Elevation in BNP with chest x-ray showing some evidence of volume overload. [DW]  2106 CT Angio Chest PE W and/or Wo Contrast Independently interpreted with no obvious evidence of acute PE.  Do note pleural effusion on right side [DW]    Clinical Course User Index [DW] Janith Lima, MD     FINAL CLINICAL IMPRESSION(S) / ED DIAGNOSES   Final diagnoses:  Hypervolemia, unspecified hypervolemia type  Acute on chronic congestive heart failure, unspecified heart failure type (HCC)  Shortness of breath     Rx / DC Orders   ED Discharge Orders     None        Note:  This document was prepared using Dragon voice recognition software and may include unintentional dictation errors.   Janith Lima, MD 07/09/23 2232

## 2023-07-09 NOTE — ED Notes (Signed)
Blanket and ice provider per request.

## 2023-07-09 NOTE — H&P (Signed)
[place]   PATIENT NAME: Anne Brennan    MR#:  [id number]  DATE OF BIRTH:  [birth year]  DATE OF ADMISSION:  07/09/2023  PRIMARY CARE PHYSICIAN: Marguarite Arbour, MD   Patient is coming from: Home  REQUESTING/REFERRING PHYSICIAN: Claudell Kyle, MD  CHIEF COMPLAINT:   Chief Complaint  Patient presents with   Chest Pain    HISTORY OF PRESENT ILLNESS:  Anne Brennan is a 62 y.o. Caucasian female with medical history significant for asthma, COPD, type 2 diabetes mellitus, fibromyalgia, GERD, hypertension and dyslipidemia as well as CVA, who presented to the emergency room with acute onset of dyspnea with associated right-sided chest pain as well as cough that has been mainly dry and occasionally productive of clear and sometimes yellow sputum over the last few days.  She admitted to orthopnea sleeping in a recliner chair as well as dyspnea on exertion and bilateral ankle edema.  She denied any paroxysmal nocturnal dyspnea.  No wheezing or hemoptysis.  No fever or chills.  No nausea or vomiting or abdominal pain.  ED Course: When the patient came back to the emergency room, BP was 137/92 with heart rate of 101 with otherwise normal vital signs.  Labs revealed mild hypokalemia.  BNP was 586.1 and high sensitive troponin I was 57 and later 70.  CBC with within normal.  Respiratory panel came back negative including COVID-19 PCR. EKG as reviewed by me : EKG showed normal sinus rhythm with a rate of 98 with biatrial enlargement and Q waves inferiorly. Imaging: 2 view chest x-ray showed vascular congestion and increased wheezing small right pleural effusion as well as cardiomegaly with surgical changes with the atrial occlusion clip.    CTA of the chest revealed the following: 1. No evidence for pulmonary embolism. 2. Small right pleural effusion, slightly increased in size. There is a new small amount of fluid within the right major fissure. 3. Right lower lobe  peribronchial wall thickening with patchy airspace disease in the right lower lobe, mildly increased from prior. Findings may be related to infection or aspiration. Follow-up imaging recommended in 4-6 weeks to confirm resolution. 4. New mild ground-glass opacities diffusely throughout both lungs may be related to edema or infection. 5. Stable cardiomegaly. 6. Aortic atherosclerosis.  The patient was given 40 mg of IV Lasix and 2 mg of IV morphine sulfate.  She will be admitted to a cardiac telemetry bed for further evaluation and management. PAST MEDICAL HISTORY:   Past Medical History:  Diagnosis Date   Asthma    Bilateral low back pain with bilateral sciatica    Bulging lumbar disc    COPD (chronic obstructive pulmonary disease) (HCC)    Coronary artery disease    Diabetes mellitus without complication (HCC)    Fibromyalgia    GERD (gastroesophageal reflux disease)    Hyperlipidemia    Hypertension    Stroke (HCC) 04/2021    PAST SURGICAL HISTORY:   Past Surgical History:  Procedure Laterality Date   ABDOMINAL HYSTERECTOMY     KNEE ARTHROSCOPY Right    LEFT HEART CATH AND CORONARY ANGIOGRAPHY N/A 06/02/2022   Procedure: LEFT HEART CATH AND CORONARY ANGIOGRAPHY;  Surgeon: Lamar Blinks, MD;  Location: [facility] INVASIVE CV [facility];  Service: Cardiovascular;  Laterality: N/A;   MICROLARYNGOSCOPY Bilateral 10/23/2021   Procedure: MICROLARYNGOSCOPY WITH EXCISION VOCAL CORD POLYPS;  Surgeon: Geanie Logan, MD;  Location: [facility] ORS;  Service: ENT;  Laterality: Bilateral;  Diabetic  SOCIAL HISTORY:   Social History   Tobacco Use   Smoking status: Every Day    Current packs/day: 0.50    Average packs/day: 0.5 packs/day for 44.0 years (22.0 ttl pk-yrs)    Types: Cigarettes   Smokeless tobacco: Never   Tobacco comments:    Started age 27  Substance Use Topics   Alcohol use: No    FAMILY HISTORY:   Family History  Problem Relation Age of Onset   Heart attack Mother     Heart attack Father    Heart failure Sister    Heart attack Brother     DRUG ALLERGIES:   Allergies  Allergen Reactions   Ibuprofen Other (See Comments)    N/V/ salivate   Peach [Prunus Persica] Itching and Swelling    Mouth itches and swells   Penicillins Rash    REVIEW OF SYSTEMS:   ROS As per history of present illness. All pertinent systems were reviewed above. Constitutional, HEENT, cardiovascular, respiratory, GI, GU, musculoskeletal, neuro, psychiatric, endocrine, integumentary and hematologic systems were reviewed and are otherwise negative/unremarkable except for positive findings mentioned above in the HPI.   MEDICATIONS AT HOME:   Prior to Admission medications   Medication Sig Start Date End Date Taking? Authorizing Provider  albuterol (VENTOLIN HFA) 108 (90 Base) MCG/ACT inhaler Inhale 2 puffs into the lungs every 4 (four) hours as needed for shortness of breath or wheezing. 04/30/21   [provider]  aspirin EC 325 MG tablet Take 1 tablet (325 mg total) by mouth daily. 05/03/22   Lorretta Harp, MD  Azelastine HCl 137 MCG/SPRAY SOLN SMARTSIG:1-2 Spray(s) Both Nares Twice Daily 04/19/22   [provider]  benzonatate (TESSALON) 200 MG capsule Take 200 mg by mouth 3 (three) times daily as needed. 05/21/22   [provider]  citalopram (CELEXA) 20 MG tablet Take 1 tablet (20 mg total) by mouth every morning. Please hold until you finish your Levaquin 10/09/22   Arnetha Courser, MD  ferrous sulfate 325 (65 FE) MG EC tablet Take 325 mg by mouth every morning. 09/01/22   [provider]  fluticasone (FLONASE) 50 MCG/ACT nasal spray Place 1 spray into both nostrils at bedtime.    [provider]  gabapentin (NEURONTIN) 400 MG capsule Take 800 mg by mouth 3 (three) times daily as needed. 04/30/21   [provider]  HUMULIN N KWIKPEN 100 UNIT/ML KwikPen Inject 40 Units into the skin in the morning and at bedtime. 05/29/22   [provider]  HYDROcodone-acetaminophen (NORCO) 10-325 MG tablet Take 1 tablet by mouth 4 (four) times daily as needed. 05/30/22   [provider]  levocetirizine (XYZAL) 5 MG tablet Take 5 mg by mouth every evening.    [provider]  losartan (COZAAR) 50 MG tablet Take 1 tablet (50 mg total) by mouth daily. 06/02/22   Tresa Moore, MD  metFORMIN (GLUCOPHAGE-XR) 500 MG 24 hr tablet Take 1,000 mg by mouth daily. 01/11/21   [provider]  metoprolol tartrate (LOPRESSOR) 25 MG tablet Take 25 mg by mouth 2 (two) times daily.    [provider]  montelukast (SINGULAIR) 10 MG tablet Take 10 mg by mouth at bedtime. 04/26/21   [provider]  NOVOLIN 70/30 (70-30) 100 UNIT/ML injection Inject 44 Units into the skin 2 (two) times daily. 04/29/21   [provider]  omeprazole (PRILOSEC) 20 MG capsule Take 20 mg by mouth 2 (two) times daily. 05/08/22   [provider]      VITAL SIGNS:  Blood pressure (!) 159/98, pulse 99, temperature 98 F (36.7 C), temperature source Oral, resp. rate 18, height 5\' 4"  (1.626 m), weight 69.9 kg, SpO2 98%.  PHYSICAL EXAMINATION:  Physical Exam  GENERAL:  62 y.o.-year-old patient sitting in the bed with mild respiratory distress with conversational dyspnea. EYES: Pupils equal, round, reactive to light and accommodation. No scleral icterus. Extraocular muscles intact.  HEENT: Head atraumatic, normocephalic. Oropharynx and nasopharynx clear.  NECK:  Supple, no jugular venous distention. No thyroid enlargement, no tenderness.  LUNGS: Diminished bibasal breath sounds with bibasal rales.. No use of accessory muscles of respiration.  CARDIOVASCULAR: Regular rate and rhythm, S1, S2 normal. No murmurs, rubs, or gallops.  ABDOMEN: Soft, nondistended, nontender. Bowel sounds present. No organomegaly or mass.  EXTREMITIES: 2+ bilateral lower extremity pitting edema with no cyanosis, or clubbing.  NEUROLOGIC:  Cranial nerves II through XII are intact. Muscle strength 5/5 in all extremities. Sensation intact. Gait not checked.  PSYCHIATRIC: The patient is alert and oriented x 3.  Normal affect and good eye contact. SKIN: No obvious rash, lesion, or ulcer.   LABORATORY PANEL:   CBC Recent Labs  Lab 07/09/23 1641  WBC 6.4  HGB 12.4  HCT 39.6  PLT 261   ------------------------------------------------------------------------------------------------------------------  Chemistries  Recent Labs  Lab 07/09/23 1641  NA 137  K 3.4*  CL 98  CO2 31  GLUCOSE 125*  BUN 11  CREATININE 0.73  CALCIUM 8.8*   ------------------------------------------------------------------------------------------------------------------  Cardiac Enzymes No results for input(s): "TROPONINI" in the last 168 hours. ------------------------------------------------------------------------------------------------------------------  RADIOLOGY:  CT Angio Chest PE W and/or Wo Contrast  Result Date: 07/09/2023 CLINICAL DATA:  Shortness of breath and right-sided chest pain. EXAM: CT ANGIOGRAPHY CHEST WITH CONTRAST TECHNIQUE: Multidetector CT imaging of the chest was performed using the standard protocol during bolus administration of intravenous contrast. Multiplanar CT image reconstructions and MIPs were obtained to evaluate the vascular anatomy. RADIATION DOSE REDUCTION: This exam was performed according to the departmental dose-optimization program which includes automated exposure control, adjustment of the mA and/or kV according to patient size and/or use of iterative reconstruction technique. CONTRAST:  75mL OMNIPAQUE IOHEXOL 350 MG/ML SOLN COMPARISON:  CT chest 03/18/2023 FINDINGS: Cardiovascular: Satisfactory opacification of the pulmonary arteries to the segmental level. No evidence of pulmonary embolism. The heart is mildly enlarged, unchanged. No pericardial effusion. There are atherosclerotic calcifications of the  aorta and coronary arteries. Patient is status post cardiac surgery. Mediastinum/Nodes: Thyroid gland is not well evaluated secondary to streak artifact. No enlarged lymph nodes are identified. The esophagus is nondilated. Lungs/Pleura: Again seen is a small right pleural effusion, slightly increased in size. There is a new small amount of fluid within the right major fissure. There is no pneumothorax. Mild emphysematous changes are again seen. There is right lower lobe peribronchial wall thickening which is new from prior. Patchy areas of scarring or atelectasis in the lingula is unchanged. There is a small amount of patchy airspace disease in the right lower lobe which has mildly increased. There is some mild ground-glass opacities diffusely throughout both lungs, new from prior. Upper Abdomen: No acute abnormality. Musculoskeletal: Again seen are changes changes of sternotomy with chronic nonunion, unchanged from prior. Review of the MIP images confirms the above findings. IMPRESSION: 1. No evidence for pulmonary embolism. 2. Small right pleural effusion, slightly increased in size. There is a new small amount of fluid within the right major fissure. 3. Right lower  lobe peribronchial wall thickening with patchy airspace disease in the right lower lobe, mildly increased from prior. Findings may be related to infection or aspiration. Follow-up imaging recommended in 4-6 weeks to confirm resolution. 4. New mild ground-glass opacities diffusely throughout both lungs may be related to edema or infection. 5. Stable cardiomegaly. 6. Aortic atherosclerosis. Aortic Atherosclerosis (ICD10-I70.0). Electronically Signed   By: Darliss Cheney M.D.   On: 07/09/2023 21:29   DG Chest 2 View  Result Date: 07/09/2023 CLINICAL DATA:  Chest pain EXAM: CHEST - 2 VIEW COMPARISON:  X-ray 10/07/2022 and older FINDINGS: Atrial occlusion clip. Enlarged cardiopericardial silhouette. Prominence of the central vasculature. No pneumothorax.  New small right effusion. Degenerative changes of the spine. IMPRESSION: Enlarged heart with surgical changes with the atrial occlusion clip. Increasing small right effusion. Vascular congestion Electronically Signed   By: Karen Kays M.D.   On: 07/09/2023 18:40      IMPRESSION AND PLAN:  Assessment and Plan: * Acute diastolic heart failure (HCC)  -The patient will be admitted to a cardiac telemetry bed. - We will continue diuresis with IV Lasix. - We Will follow serial troponins. - We will follow I's and O's and daily weights. - Cardiology consult be obtained. - I notified Dr. Melton Alar about the patient. - Most recent 2D echo revealed an EF of 50 to 55% and at which time diastolic function was normal.  Depression - Continue Wellbutrin XL and Celexa.  Asthma, chronic - We will continue Singulair and her inhalers.  Type 2 diabetes mellitus with peripheral neuropathy (HCC) - The patient will be placed on supplemental coverage with NovoLog. - We will continue her basal coverage. - We will continue Neurontin. - We will hold off metformin.  Dyslipidemia - We will continue statin therapy.     DVT prophylaxis: Lovenox.  Advanced Care Planning:  Code Status: full code.  Family Communication:  The plan of care was discussed in details with the patient (and family). I answered all questions. The patient agreed to proceed with the above mentioned plan. Further management will depend upon hospital course. Disposition Plan: Back to previous home environment Consults called: Cardiology. All the records are reviewed and case discussed with ED provider.  Status is: Inpatient   At the time of the admission, it appears that the appropriate admission status for this patient is inpatient.  This is judged to be reasonable and necessary in order to provide the required intensity of service to ensure the patient's safety given the presenting symptoms, physical exam findings and initial  radiographic and laboratory data in the context of comorbid conditions.  The patient requires inpatient status due to high intensity of service, high risk of further deterioration and high frequency of surveillance required.  I certify that at the time of admission, it is my clinical judgment that the patient will require inpatient hospital care extending more than 2 midnights.                            Dispo: The patient is from: Home              Anticipated d/c is to: Home              Patient currently is not medically stable to d/c.              Difficult to place patient: No  Hannah Beat M.D on 07/10/2023 at 1:17 AM  [facility]  From 7 PM-7 AM, contact night-coverage [email or web address]  CC: Primary care physician; Marguarite Arbour, MD

## 2023-07-10 ENCOUNTER — Other Ambulatory Visit (HOSPITAL_COMMUNITY): Payer: Self-pay

## 2023-07-10 ENCOUNTER — Encounter: Payer: Self-pay | Admitting: Family Medicine

## 2023-07-10 ENCOUNTER — Inpatient Hospital Stay
Admit: 2023-07-10 | Discharge: 2023-07-10 | Disposition: A | Discharge status: Home / Self Care | Payer: 59 | Attending: Family Medicine | Admitting: Family Medicine

## 2023-07-10 DIAGNOSIS — J45909 Unspecified asthma, uncomplicated: Secondary | ICD-10-CM | POA: Insufficient documentation

## 2023-07-10 DIAGNOSIS — I5031 Acute diastolic (congestive) heart failure: Secondary | ICD-10-CM | POA: Diagnosis not present

## 2023-07-10 DIAGNOSIS — E1142 Type 2 diabetes mellitus with diabetic polyneuropathy: Secondary | ICD-10-CM

## 2023-07-10 LAB — BASIC METABOLIC PANEL
Anion gap: 9 (ref 5–15)
BUN: 11 mg/dL (ref 8–23)
CO2: 31 mmol/L (ref 22–32)
Calcium: 9 mg/dL (ref 8.9–10.3)
Chloride: 100 mmol/L (ref 98–111)
Creatinine, Ser: 0.73 mg/dL (ref 0.44–1.00)
GFR, Estimated: >60 mL/min (ref >60)
Glucose, Bld: 139 mg/dL — ABNORMAL HIGH (ref 70–99)
Potassium: 3.2 mmol/L — ABNORMAL LOW (ref 3.5–5.1)
Sodium: 140 mmol/L (ref 135–145)

## 2023-07-10 LAB — CBC
HCT: 40.6 % (ref 36.0–46.0)
Hemoglobin: 12.6 g/dL (ref 12.0–15.0)
MCH: 28.3 pg (ref 26.0–34.0)
MCHC: 31 g/dL (ref 30.0–36.0)
MCV: 91.2 fL (ref 80.0–100.0)
Platelets: 281 10*3/uL (ref 150–400)
RBC: 4.45 MIL/uL (ref 3.87–5.11)
RDW: 15 % (ref 11.5–15.5)
WBC: 6.4 10*3/uL (ref 4.0–10.5)
nRBC: 0 % (ref 0.0–0.2)

## 2023-07-10 LAB — HEMOGLOBIN A1C
Hgb A1c MFr Bld: 7.1 % — ABNORMAL HIGH (ref 4.8–5.6)
Mean Plasma Glucose: 157.07 mg/dL

## 2023-07-10 LAB — CBG MONITORING, ED
Glucose-Capillary: 150 mg/dL — ABNORMAL HIGH (ref 70–99)
Glucose-Capillary: 138 mg/dL — ABNORMAL HIGH (ref 70–99)

## 2023-07-10 LAB — HIV ANTIBODY (ROUTINE TESTING W REFLEX): HIV Screen 4th Generation wRfx: NONREACTIVE

## 2023-07-10 MED ORDER — INSULIN ASPART 100 UNIT/ML IJ SOLN: 0.0000 [IU] | Freq: Every day | INTRAMUSCULAR | Status: DC

## 2023-07-10 MED ORDER — FUROSEMIDE 40 MG PO TABS: 40.0000 mg | ORAL_TABLET | Freq: Every day | ORAL | 0 refills | Status: AC

## 2023-07-10 MED ORDER — INSULIN ASPART 100 UNIT/ML IJ SOLN: 0.0000 [IU] | Freq: Three times a day (TID) | INTRAMUSCULAR | Status: DC

## 2023-07-10 MED ORDER — MONTELUKAST SODIUM 10 MG PO TABS: 10.0000 mg | ORAL_TABLET | Freq: Every day | ORAL | Status: DC

## 2023-07-10 MED ORDER — FLUTICASONE PROPIONATE 50 MCG/ACT NA SUSP
1.0000 | Freq: Every day | NASAL | Status: DC
  Filled 2023-07-10: qty 16

## 2023-07-10 MED ORDER — CETIRIZINE HCL 10 MG PO TABS
5.0000 mg | ORAL_TABLET | Freq: Every evening | ORAL | Status: DC
  Filled 2023-07-10: qty 1

## 2023-07-10 NOTE — TOC Benefit Eligibility Note (Signed)
Patient Product/process development scientist completed.    The patient is insured through [facility]. Patient has Medicare and is not eligible for a copay card, but may be able to apply for patient assistance, if available.    Ran test claim for Entresto 24-26 mg and the current 30 day co-pay is $0.00.  Ran test claim for Farxiga 10 mg and the current 30 day co-pay is $0.00.  Ran test claim for Jardiance 10 mg and the current 30 day co-pay is $0.00.   This test claim was processed through [facility]- copay amounts may vary at other pharmacies due to pharmacy/plan contracts, or as the patient moves through the different stages of their insurance plan.     Roland Earl, CPHT Pharmacy Technician III Certified Patient Advocate [facility] Patient Advocate Team Direct Number: [phone]  Fax: [phone]

## 2023-07-10 NOTE — Assessment & Plan Note (Signed)
-   We will continue statin therapy. 

## 2023-07-10 NOTE — Consult Note (Signed)
[facility] CLINIC CARDIOLOGY CONSULT NOTE       Patient ID: Anne Brennan MRN: [id number] DOB/AGE: 62-Mar-1962 62 y.o.  Admit date: 07/09/2023 Referring Physician Dr Lyn Hollingshead Primary Physician Dr Judithann Sheen Primary Cardiologist Gwen Pounds (retired) Reason for Consultation HFpEF  HPI: Anne Brennan is a 62 y.o. female with asthma, COPD, type 2 diabetes mellitus, fibromyalgia, GERD, hypertension, dyslipidemia, and CVA  who presented with acute onset dyspnea, right sided chest pain, and cough. CXR on admission was notable for new small right effusion. CTA showed no evidence of PE. Troponin trended 57>70. BNP on admission was 586.1. Most recent LVEF in 05/2022 was 50-55%.   Pertinent cardiac history: Hospitalized 04/2021 with TIA. In 05/2021 LVEF was 60-65% with moderate LV hypertrophy. Admitted for NSTEMI in 05/2022. At that time, LVEF was 50-55% with normal diastolic function, normal RV, mild MR, and trivial AR. Cardiac cath showed Prox RCA lesion 100% stenosed, Dist RCA lesion 100% stenosed, Ost Cx to Prox Cx lesion is 95% stenosed, Mid LM lesion is 75% stenosed, Dist LM to Ost LAD lesion is 75% stenosed, Prox LAD lesion is 40% stenosed with 90% stenosed side branch in 1st Sept with mildly elevated LVEDP. No intervention performed due to collaterals formed.   Patient seen and examined at bedside, resting comfortably. She is feeling a lot better and would like to go home. She is not having any chest pain or shortness of breath at this time. She is going to have a coronary CTA coming up soon. Denies chest pain, shortness of breath, palpitations, diaphoresis, syncope, edema, PND, orthopnea.   Review of systems complete and found to be negative unless listed above     Past Medical History:  Diagnosis Date   Asthma    Bilateral low back pain with bilateral sciatica    Bulging lumbar disc    COPD (chronic obstructive pulmonary disease) (HCC)    Coronary artery disease    Diabetes  mellitus without complication (HCC)    Fibromyalgia    GERD (gastroesophageal reflux disease)    Hyperlipidemia    Hypertension    Influenza and pneumonia 10/08/2022   Stroke (HCC) 04/2021    Past Surgical History:  Procedure Laterality Date   ABDOMINAL HYSTERECTOMY     KNEE ARTHROSCOPY Right    LEFT HEART CATH AND CORONARY ANGIOGRAPHY N/A 06/02/2022   Procedure: LEFT HEART CATH AND CORONARY ANGIOGRAPHY;  Surgeon: Lamar Blinks, MD;  Location: [facility] INVASIVE CV LAB;  Service: Cardiovascular;  Laterality: N/A;   MICROLARYNGOSCOPY Bilateral 10/23/2021   Procedure: MICROLARYNGOSCOPY WITH EXCISION VOCAL CORD POLYPS;  Surgeon: Geanie Logan, MD;  Location: [facility] ORS;  Service: ENT;  Laterality: Bilateral;  Diabetic    (Not in a hospital admission)  Social History   Socioeconomic History   Marital status: Widowed    Spouse name: Not on file   Number of children: Not on file   Years of education: Not on file   Highest education level: Not on file  Occupational History   Not on file  Tobacco Use   Smoking status: Every Day    Current packs/day: 0.50    Average packs/day: 0.5 packs/day for 44.0 years (22.0 ttl pk-yrs)    Types: Cigarettes   Smokeless tobacco: Never   Tobacco comments:    Started age 17  Vaping Use   Vaping status: Never Used  Substance and Sexual Activity   Alcohol use: No   Drug use: Never   Sexual activity: Not on file  Other Topics  Concern   Not on file  Social History Narrative   Not on file   Social Determinants of Health   Financial Resource Strain: Low Risk  (06/08/2023)   Received from [facility]   Overall Financial Resource Strain (CARDIA)    Difficulty of Paying Living Expenses: Not hard at all  Food Insecurity: No Food Insecurity (06/08/2023)   Received from [facility]   Hunger Vital Sign    Worried About Running Out of Food in the Last Year: Never true    Ran Out of Food in the Last Year: Never true   Transportation Needs: No Transportation Needs (06/08/2023)   Received from [facility] - Transportation    In the past 12 months, has lack of transportation kept you from medical appointments or from getting medications?: No    Lack of Transportation (Non-Medical): No  Physical Activity: Not on file  Stress: Not on file  Social Connections: Not on file  Intimate Partner Violence: Not on file    Family History  Problem Relation Age of Onset   Heart attack Mother    Heart attack Father    Heart failure Sister    Heart attack Brother      Vitals:   07/10/23 0330 07/10/23 0456 07/10/23 0542 07/10/23 0613  BP:  (!) 161/97 (!) 155/85   Pulse: 100 (!) 103 90   Resp:   12   Temp:    97.9 F (36.6 C)  TempSrc:    Oral  SpO2:   98%   Weight:      Height:        PHYSICAL EXAM General: alert, well nourished, in no acute distress. HEENT: Normocephalic and atraumatic. Neck: No JVD.  Lungs: Normal respiratory effort. Clear bilaterally to auscultation. No wheezes, crackles, rhonchi.  Heart: HRRR. Normal S1 and S2 without gallops or murmurs.  Abdomen: Non-distended appearing.  Msk: Normal strength and tone for age. Extremities: Warm and well perfused. No clubbing, cyanosis. no edema.  Neuro: Alert and oriented X 3. Psych: Answers questions appropriately.   Labs: Basic Metabolic Panel: Recent Labs    07/09/23 1641 07/10/23 0459  NA 137 140  K 3.4* 3.2*  CL 98 100  CO2 31 31  GLUCOSE 125* 139*  BUN 11 11  CREATININE 0.73 0.73  CALCIUM 8.8* 9.0   Liver Function Tests: No results for input(s): "AST", "ALT", "ALKPHOS", "BILITOT", "PROT", "ALBUMIN" in the last 72 hours. No results for input(s): "LIPASE", "AMYLASE" in the last 72 hours. CBC: Recent Labs    07/09/23 1641 07/10/23 0459  WBC 6.4 6.4  HGB 12.4 12.6  HCT 39.6 40.6  MCV 91.7 91.2  PLT 261 281   Cardiac Enzymes: Recent Labs    07/09/23 1641 07/09/23 2014  TROPONINIHS 57* 70*    BNP: Recent Labs    07/09/23 2014  BNP 586.1*   D-Dimer: No results for input(s): "DDIMER" in the last 72 hours. Hemoglobin A1C: No results for input(s): "HGBA1C" in the last 72 hours. Fasting Lipid Panel: No results for input(s): "CHOL", "HDL", "LDLCALC", "TRIG", "CHOLHDL", "LDLDIRECT" in the last 72 hours. Thyroid Function Tests: No results for input(s): "TSH", "T4TOTAL", "T3FREE", "THYROIDAB" in the last 72 hours.  Invalid input(s): "FREET3" Anemia Panel: No results for input(s): "VITAMINB12", "FOLATE", "FERRITIN", "TIBC", "IRON", "RETICCTPCT" in the last 72 hours.   Radiology: CT Angio Chest PE W and/or Wo Contrast  Result Date: 07/09/2023 CLINICAL DATA:  Shortness of  breath and right-sided chest pain. EXAM: CT ANGIOGRAPHY CHEST WITH CONTRAST TECHNIQUE: Multidetector CT imaging of the chest was performed using the standard protocol during bolus administration of intravenous contrast. Multiplanar CT image reconstructions and MIPs were obtained to evaluate the vascular anatomy. RADIATION DOSE REDUCTION: This exam was performed according to the departmental dose-optimization program which includes automated exposure control, adjustment of the mA and/or kV according to patient size and/or use of iterative reconstruction technique. CONTRAST:  75mL OMNIPAQUE IOHEXOL 350 MG/ML SOLN COMPARISON:  CT chest 03/18/2023 FINDINGS: Cardiovascular: Satisfactory opacification of the pulmonary arteries to the segmental level. No evidence of pulmonary embolism. The heart is mildly enlarged, unchanged. No pericardial effusion. There are atherosclerotic calcifications of the aorta and coronary arteries. Patient is status post cardiac surgery. Mediastinum/Nodes: Thyroid gland is not well evaluated secondary to streak artifact. No enlarged lymph nodes are identified. The esophagus is nondilated. Lungs/Pleura: Again seen is a small right pleural effusion, slightly increased in size. There is a new small amount  of fluid within the right major fissure. There is no pneumothorax. Mild emphysematous changes are again seen. There is right lower lobe peribronchial wall thickening which is new from prior. Patchy areas of scarring or atelectasis in the lingula is unchanged. There is a small amount of patchy airspace disease in the right lower lobe which has mildly increased. There is some mild ground-glass opacities diffusely throughout both lungs, new from prior. Upper Abdomen: No acute abnormality. Musculoskeletal: Again seen are changes changes of sternotomy with chronic nonunion, unchanged from prior. Review of the MIP images confirms the above findings. IMPRESSION: 1. No evidence for pulmonary embolism. 2. Small right pleural effusion, slightly increased in size. There is a new small amount of fluid within the right major fissure. 3. Right lower lobe peribronchial wall thickening with patchy airspace disease in the right lower lobe, mildly increased from prior. Findings may be related to infection or aspiration. Follow-up imaging recommended in 4-6 weeks to confirm resolution. 4. New mild ground-glass opacities diffusely throughout both lungs may be related to edema or infection. 5. Stable cardiomegaly. 6. Aortic atherosclerosis. Aortic Atherosclerosis (ICD10-I70.0). Electronically Signed   By: Darliss Cheney M.D.   On: 07/09/2023 21:29   DG Chest 2 View  Result Date: 07/09/2023 CLINICAL DATA:  Chest pain EXAM: CHEST - 2 VIEW COMPARISON:  X-ray 10/07/2022 and older FINDINGS: Atrial occlusion clip. Enlarged cardiopericardial silhouette. Prominence of the central vasculature. No pneumothorax. New small right effusion. Degenerative changes of the spine. IMPRESSION: Enlarged heart with surgical changes with the atrial occlusion clip. Increasing small right effusion. Vascular congestion Electronically Signed   By: Karen Kays M.D.   On: 07/09/2023 18:40    ECHO pending  TELEMETRY reviewed by me [facility]) 07/10/2023 : NSR  EKG  reviewed by me: NSR with bi-atrial enlargement. ST & T wave changes in lateral leads  Data reviewed by me [facility]) 07/10/2023: last 24h vitals tele labs imaging I/O provider notes notes  Principal Problem:   Acute diastolic heart failure (HCC) Active Problems:   History of transient ischemic attack (TIA)   Essential hypertension   Chronic obstructive pulmonary disease (COPD) (HCC)   Fibromyalgia   Depression   Chronic kidney disease, stage 3a (HCC)   Dyslipidemia   Type 2 diabetes mellitus with peripheral neuropathy (HCC)   Asthma, chronic    ASSESSMENT AND PLAN:   Acute diastolic heart failure  - Patient appears euvolemic this morning - We will follow I's and O's and daily weights. -  Most recent 2D echo revealed an EF of 50 to 55% and at which time diastolic function was normal. Repeat echo pending however should not hold up discharge as we can obtain echo in the office. Patient to follow-up with Dr Juliann Pares in 1-2 weeks. She will schedule coronary CTA that Dr Juliann Pares has ordered. Would initiate Jardiance/Farxiga +/- Spironolactone    Dyslipidemia - continue statin therapy.   Type 2 diabetes mellitus  - per primary team  Patient stable for discharge from cardiac standpoint.   Clotilde Dieter, DO 07/10/2023, 10:29 AM [facility]

## 2023-07-10 NOTE — Assessment & Plan Note (Signed)
Continue Wellbutrin XL and Celexa

## 2023-07-10 NOTE — Care Management CC44 (Signed)
Condition Code 44 Documentation Completed  Patient Details  Name: Anne Brennan MRN: [id number] Date of Birth: Jan 18, 1961   Condition Code 44 given:  Yes Patient signature on Condition Code 44 notice:  Yes Documentation of 2 MD's agreement:  Yes Code 44 added to claim:  Yes    Yardley Beltran E Lukasz Rogus, LCSW 07/10/2023, 11:13 AM

## 2023-07-10 NOTE — Discharge Summary (Signed)
Physician Discharge Summary   Patient: Anne Brennan MRN: [id number]  DOB: 1960-11-02   Admit:     Date of Admission: 07/09/2023 Admitted from: home   Discharge: Date of discharge: 07/10/23 Disposition: Home Condition at discharge: good  CODE STATUS: FULL CODE     Discharge Physician: Sunnie Nielsen, DO [facility]     PCP: Marguarite Arbour, MD  Recommendations for Outpatient Follow-up:  Follow up with PCP Marguarite Arbour, MD in 1-2 weeks Follow up as directed w/ cardiology in 1 week w/ Dr Juliann Pares - per Dr  Please obtain labs/tests: CCTA w/ cardiology, monitor labs w/ BMP given diuresis  Please follow up on the following pending results: none PCP AND OTHER OUTPATIENT PROVIDERS: SEE BELOW FOR SPECIFIC DISCHARGE INSTRUCTIONS PRINTED FOR PATIENT IN ADDITION TO GENERIC AVS PATIENT INFO     Discharge Instructions     (HEART FAILURE PATIENTS) Call MD:  Anytime you have any of the following symptoms: 1) 3 pound weight gain in 24 hours or 5 pounds in 1 week 2) shortness of breath, with or without a dry hacking cough 3) swelling in the hands, feet or stomach 4) if you have to sleep on extra pillows at night in order to breathe.   Complete by: As directed    Diet - low sodium heart healthy   Complete by: As directed    Increase activity slowly   Complete by: As directed          Discharge Diagnoses: Principal Problem:   Acute diastolic heart failure (HCC) Active Problems:   Chronic obstructive pulmonary disease (COPD) (HCC)   Dyslipidemia   History of transient ischemic attack (TIA)   Type 2 diabetes mellitus with peripheral neuropathy (HCC)   Essential hypertension   Chronic kidney disease, stage 3a (HCC)   Depression   Fibromyalgia   Asthma, chronic       Hospital Course: Terrion IEISHA Brennan is a 62 y.o. Caucasian female with medical history significant for asthma, COPD, type 2 diabetes mellitus, fibromyalgia, GERD, hypertension and  dyslipidemia as well as CVA, who presented to the emergency room with acute onset of dyspnea with associated right-sided chest pain as well as cough that has been mainly dry and occasionally productive of clear and sometimes yellow sputum over the last few days.  She admitted to orthopnea sleeping in a recliner chair as well as dyspnea on exertion and bilateral ankle edema. CXR (+)cardiomegaly and edema. Starte don diuresis and cardiology consulted. Spoke w/ Dr Darron Doom of cardiology this morning - pt reports feeling improved and req1uesting for discharge. Not requiring O2, has cardiology f/u planned and can defer echo for now   Consultants:  Cardiology  Procedures: none      ASSESSMENT & PLAN:   Principal Problem:   Acute diastolic heart failure (HCC) Active Problems:   Chronic obstructive pulmonary disease (COPD) (HCC)   Dyslipidemia   History of transient ischemic attack (TIA)   Type 2 diabetes mellitus with peripheral neuropathy (HCC)   Essential hypertension   Chronic kidney disease, stage 3a (HCC)   Depression   Fibromyalgia   Asthma, chronic   Acute diastolic heart failure (HCC) Essential HTN Starting po lasix  Follow up w/ cardiology Low salt fluid restricted diet  Losartan and metoprolol    Hx TIA ASA, statin  Depression Continue Wellbutrin XL and Celexa.   Asthma, chronic Continue Singulair and her inhalers.   Type 2 diabetes mellitus with peripheral neuropathy (HCC) Conitnue  home medications Follow PCP   Dyslipidemia continue statin therapy.       Discharge Instructions  Allergies as of 07/10/2023       Reactions   Ibuprofen Other (See Comments)   N/V/ salivate   Peach [prunus Persica] Itching, Swelling   Mouth itches and swells   Penicillins Rash        Medication List     STOP taking these medications    omeprazole 20 MG capsule Commonly known as: PRILOSEC       TAKE these medications    albuterol 108 (90 Base) MCG/ACT  inhaler Commonly known as: VENTOLIN HFA Inhale 2 puffs into the lungs every 4 (four) hours as needed for shortness of breath or wheezing.   aspirin EC 325 MG tablet Take 1 tablet (325 mg total) by mouth daily.   Azelastine HCl 137 MCG/SPRAY Soln SMARTSIG:1-2 Spray(s) Both Nares Twice Daily   benzonatate 200 MG capsule Commonly known as: TESSALON Take 200 mg by mouth 3 (three) times daily as needed.   citalopram 20 MG tablet Commonly known as: CELEXA Take 1 tablet (20 mg total) by mouth every morning. Please hold until you finish your Levaquin   ferrous sulfate 325 (65 FE) MG EC tablet Take 325 mg by mouth every morning.   fluorometholone 0.1 % ophthalmic suspension Commonly known as: FML 1 drop 4 (four) times daily.   fluticasone 50 MCG/ACT nasal spray Commonly known as: FLONASE Place 1 spray into both nostrils at bedtime.   furosemide 40 MG tablet Commonly known as: Lasix Take 1 tablet (40 mg total) by mouth daily. Increase to 1 tablet (40 mg total) by mouth TWICE daily (total daily dose 80 mg) as needed for up to 3 days for increased leg swelling, shortness of breath, weight gain 5+ lbs over 1-2 days. Seek medical care if these symptoms are not improving with increased dose.   gabapentin 400 MG capsule Commonly known as: NEURONTIN Take 800 mg by mouth 3 (three) times daily as needed.   HumuLIN N KwikPen 100 UNIT/ML KwikPen Generic drug: Insulin NPH (Human) (Isophane) Inject 40 Units into the skin in the morning and at bedtime.   HYDROcodone-acetaminophen 10-325 MG tablet Commonly known as: NORCO Take 1 tablet by mouth 4 (four) times daily as needed.   levocetirizine 5 MG tablet Commonly known as: XYZAL Take 5 mg by mouth every evening.   losartan 50 MG tablet Commonly known as: COZAAR Take 1 tablet (50 mg total) by mouth daily.   metFORMIN 500 MG 24 hr tablet Commonly known as: GLUCOPHAGE-XR Take 1,000 mg by mouth daily.   metoprolol tartrate 25 MG  tablet Commonly known as: LOPRESSOR Take 25 mg by mouth 2 (two) times daily.   montelukast 10 MG tablet Commonly known as: SINGULAIR Take 10 mg by mouth at bedtime.   NovoLIN 70/30 (70-30) 100 UNIT/ML injection Generic drug: insulin NPH-regular Human Inject 44 Units into the skin 2 (two) times daily.         Follow-up Information     Dorothyann Peng D, MD. Schedule an appointment as soon as possible for a visit in 1 week(s).   Specialties: Cardiology, Internal Medicine Why: hospital follow up Contact information: [address] [place] [address] [phone]                 Allergies  Allergen Reactions   Ibuprofen Other (See Comments)    N/V/ salivate   Peach [Prunus Persica] Itching and Swelling  Mouth itches and swells   Penicillins Rash     Subjective: pt feling well this morning and requesting for d/c home, ambulating independently and no chest pain, not needing O2   Discharge Exam: BP (!) 155/85   Pulse 90   Temp 97.9 F (36.6 C) (Oral)   Resp 12   Ht 5\' 4"  (1.626 m)   Wt 69.9 kg   SpO2 98%   BMI 26.43 kg/m  General: Pt is alert, awake, not in acute distress Cardiovascular: RRR, S1/S2 +, no rubs, no gallops Respiratory: (+)rales  Abdominal: Soft, NT, ND, bowel sounds + Extremities: no edema, no cyanosis     The results of significant diagnostics from this hospitalization (including imaging, microbiology, ancillary and laboratory) are listed below for reference.     Microbiology: Recent Results (from the past 240 hour(s))  Resp panel by RT-PCR (RSV, Flu A&B, Covid) Anterior Nasal Swab     Status: None   Collection Time: 07/09/23  9:43 PM   Specimen: Anterior Nasal Swab  Result Value Ref Range Status   SARS Coronavirus 2 by RT PCR NEGATIVE NEGATIVE Final    Comment: (NOTE) SARS-CoV-2 target nucleic acids are NOT DETECTED.  The SARS-CoV-2 RNA is generally detectable in upper respiratory specimens during the acute phase  of infection. The lowest concentration of SARS-CoV-2 viral copies this assay can detect is 138 copies/mL. A negative result does not preclude SARS-Cov-2 infection and should not be used as the sole basis for treatment or other patient management decisions. A negative result may occur with  improper specimen collection/handling, submission of specimen other than nasopharyngeal swab, presence of viral mutation(s) within the areas targeted by this assay, and inadequate number of viral copies(<138 copies/mL). A negative result must be combined with clinical observations, patient history, and epidemiological information. The expected result is Negative.  Fact Sheet for Patients:  BloggerCourse.com  Fact Sheet for Healthcare Providers:  SeriousBroker.it  This test is no t yet approved or cleared by the [place] FDA and  has been authorized for detection and/or diagnosis of SARS-CoV-2 by FDA under an Emergency Use Authorization (EUA). This EUA will remain  in effect (meaning this test can be used) for the duration of the COVID-19 declaration under Section 564(b)(1) of the Act, 21 U.S.C.section 360bbb-3(b)(1), unless the authorization is terminated  or revoked sooner.       Influenza A by PCR NEGATIVE NEGATIVE Final   Influenza B by PCR NEGATIVE NEGATIVE Final    Comment: (NOTE) The Xpert Xpress SARS-CoV-2/FLU/RSV plus assay is intended as an aid in the diagnosis of influenza from Nasopharyngeal swab specimens and should not be used as a sole basis for treatment. Nasal washings and aspirates are unacceptable for Xpert Xpress SARS-CoV-2/FLU/RSV testing.  Fact Sheet for Patients: BloggerCourse.com  Fact Sheet for Healthcare Providers: SeriousBroker.it  This test is not yet approved or cleared by the [place] FDA and has been authorized for detection and/or diagnosis of  SARS-CoV-2 by FDA under an Emergency Use Authorization (EUA). This EUA will remain in effect (meaning this test can be used) for the duration of the COVID-19 declaration under Section 564(b)(1) of the Act, 21 U.S.C. section 360bbb-3(b)(1), unless the authorization is terminated or revoked.     Resp Syncytial Virus by PCR NEGATIVE NEGATIVE Final    Comment: (NOTE) Fact Sheet for Patients: BloggerCourse.com  Fact Sheet for Healthcare Providers: SeriousBroker.it  This test is not yet approved or cleared by the [place] and has been authorized  for detection and/or diagnosis of SARS-CoV-2 by FDA under an Emergency Use Authorization (EUA). This EUA will remain in effect (meaning this test can be used) for the duration of the COVID-19 declaration under Section 564(b)(1) of the Act, 21 U.S.C. section 360bbb-3(b)(1), unless the authorization is terminated or revoked.  Performed at [facility], [address] [place], [place] [address]      Labs: BNP (last 3 results) Recent Labs    07/09/23 2014  BNP 586.1*   Basic Metabolic Panel: Recent Labs  Lab 07/09/23 1641 07/10/23 0459  NA 137 140  K 3.4* 3.2*  CL 98 100  CO2 31 31  GLUCOSE 125* 139*  BUN 11 11  CREATININE 0.73 0.73  CALCIUM 8.8* 9.0   Liver Function Tests: No results for input(s): "AST", "ALT", "ALKPHOS", "BILITOT", "PROT", "ALBUMIN" in the last 168 hours. No results for input(s): "LIPASE", "AMYLASE" in the last 168 hours. No results for input(s): "AMMONIA" in the last 168 hours. CBC: Recent Labs  Lab 07/09/23 1641 07/10/23 0459  WBC 6.4 6.4  HGB 12.4 12.6  HCT 39.6 40.6  MCV 91.7 91.2  PLT 261 281   Cardiac Enzymes: No results for input(s): "CKTOTAL", "CKMB", "CKMBINDEX", "TROPONINI" in the last 168 hours. BNP: Invalid input(s): "POCBNP" CBG: Recent Labs  Lab 07/10/23 0213 07/10/23 0738  GLUCAP 150* 138*   D-Dimer No  results for input(s): "DDIMER" in the last 72 hours. Hgb A1c No results for input(s): "HGBA1C" in the last 72 hours. Lipid Profile No results for input(s): "CHOL", "HDL", "LDLCALC", "TRIG", "CHOLHDL", "LDLDIRECT" in the last 72 hours. Thyroid function studies No results for input(s): "TSH", "T4TOTAL", "T3FREE", "THYROIDAB" in the last 72 hours.  Invalid input(s): "FREET3" Anemia work up No results for input(s): "VITAMINB12", "FOLATE", "FERRITIN", "TIBC", "IRON", "RETICCTPCT" in the last 72 hours. Urinalysis    Component Value Date/Time   COLORURINE STRAW (A) 12/13/2021 1823   APPEARANCEUR CLEAR (A) 12/13/2021 1823   LABSPEC 1.006 12/13/2021 1823   PHURINE 6.0 12/13/2021 1823   GLUCOSEU NEGATIVE 12/13/2021 1823   HGBUR MODERATE (A) 12/13/2021 1823   BILIRUBINUR NEGATIVE 12/13/2021 1823   KETONESUR NEGATIVE 12/13/2021 1823   PROTEINUR NEGATIVE 12/13/2021 1823   NITRITE NEGATIVE 12/13/2021 1823   LEUKOCYTESUR NEGATIVE 12/13/2021 1823   Sepsis Labs Recent Labs  Lab 07/09/23 1641 07/10/23 0459  WBC 6.4 6.4   Microbiology Recent Results (from the past 240 hour(s))  Resp panel by RT-PCR (RSV, Flu A&B, Covid) Anterior Nasal Swab     Status: None   Collection Time: 07/09/23  9:43 PM   Specimen: Anterior Nasal Swab  Result Value Ref Range Status   SARS Coronavirus 2 by RT PCR NEGATIVE NEGATIVE Final    Comment: (NOTE) SARS-CoV-2 target nucleic acids are NOT DETECTED.  The SARS-CoV-2 RNA is generally detectable in upper respiratory specimens during the acute phase of infection. The lowest concentration of SARS-CoV-2 viral copies this assay can detect is 138 copies/mL. A negative result does not preclude SARS-Cov-2 infection and should not be used as the sole basis for treatment or other patient management decisions. A negative result may occur with  improper specimen collection/handling, submission of specimen other than nasopharyngeal swab, presence of viral mutation(s) within  the areas targeted by this assay, and inadequate number of viral copies(<138 copies/mL). A negative result must be combined with clinical observations, patient history, and epidemiological information. The expected result is Negative.  Fact Sheet for Patients:  BloggerCourse.com  Fact Sheet for Healthcare Providers:  SeriousBroker.it  This test is no t yet approved or cleared by the [place] and  has been authorized for detection and/or diagnosis of SARS-CoV-2 by FDA under an Emergency Use Authorization (EUA). This EUA will remain  in effect (meaning this test can be used) for the duration of the COVID-19 declaration under Section 564(b)(1) of the Act, 21 U.S.C.section 360bbb-3(b)(1), unless the authorization is terminated  or revoked sooner.       Influenza A by PCR NEGATIVE NEGATIVE Final   Influenza B by PCR NEGATIVE NEGATIVE Final    Comment: (NOTE) The Xpert Xpress SARS-CoV-2/FLU/RSV plus assay is intended as an aid in the diagnosis of influenza from Nasopharyngeal swab specimens and should not be used as a sole basis for treatment. Nasal washings and aspirates are unacceptable for Xpert Xpress SARS-CoV-2/FLU/RSV testing.  Fact Sheet for Patients: BloggerCourse.com  Fact Sheet for Healthcare Providers: SeriousBroker.it  This test is not yet approved or cleared by the [place] FDA and has been authorized for detection and/or diagnosis of SARS-CoV-2 by FDA under an Emergency Use Authorization (EUA). This EUA will remain in effect (meaning this test can be used) for the duration of the COVID-19 declaration under Section 564(b)(1) of the Act, 21 U.S.C. section 360bbb-3(b)(1), unless the authorization is terminated or revoked.     Resp Syncytial Virus by PCR NEGATIVE NEGATIVE Final    Comment: (NOTE) Fact Sheet for  Patients: BloggerCourse.com  Fact Sheet for Healthcare Providers: SeriousBroker.it  This test is not yet approved or cleared by the [place] FDA and has been authorized for detection and/or diagnosis of SARS-CoV-2 by FDA under an Emergency Use Authorization (EUA). This EUA will remain in effect (meaning this test can be used) for the duration of the COVID-19 declaration under Section 564(b)(1) of the Act, 21 U.S.C. section 360bbb-3(b)(1), unless the authorization is terminated or revoked.  Performed at [facility], [address] [place], [place] [address]    Imaging No results found.    Time coordinating discharge: over 30 minutes  SIGNED:  Sunnie Nielsen DO [facility]

## 2023-07-10 NOTE — Assessment & Plan Note (Signed)
-   We will continue Singulair and her inhalers.

## 2023-07-10 NOTE — Assessment & Plan Note (Addendum)
-  The patient will be admitted to a cardiac telemetry bed. - We will continue diuresis with IV Lasix. - We Will follow serial troponins. - We will follow I's and O's and daily weights. - Cardiology consult be obtained. - I notified Dr. Melton Alar about the patient. - Most recent 2D echo revealed an EF of 50 to 55% and at which time diastolic function was normal.

## 2023-07-10 NOTE — Assessment & Plan Note (Signed)
-   The patient will be placed on supplemental coverage with NovoLog. - We will continue her basal coverage. - We will continue Neurontin. - We will hold off metformin.

## 2023-07-13 ENCOUNTER — Other Ambulatory Visit: Payer: Self-pay | Admitting: Internal Medicine

## 2023-07-13 DIAGNOSIS — I2089 Other forms of angina pectoris: Secondary | ICD-10-CM

## 2023-07-15 ENCOUNTER — Other Ambulatory Visit: Payer: Self-pay | Admitting: Specialist

## 2023-07-15 DIAGNOSIS — J440 Chronic obstructive pulmonary disease with acute lower respiratory infection: Secondary | ICD-10-CM

## 2023-07-21 ENCOUNTER — Telehealth (HOSPITAL_COMMUNITY): Payer: Self-pay | Admitting: Emergency Medicine

## 2023-07-21 NOTE — Telephone Encounter (Signed)
Attempted to call patient regarding upcoming cardiac CT appointment. °Left message on voicemail with name and callback number °Dwan Fennel RN Navigator Cardiac Imaging °[facility] °[phone] Office °[phone] Cell ° °

## 2023-07-22 ENCOUNTER — Other Ambulatory Visit (HOSPITAL_COMMUNITY): Payer: Self-pay | Admitting: Emergency Medicine

## 2023-07-22 ENCOUNTER — Ambulatory Visit
Admission: RE | Admit: 2023-07-22 | Discharge: 2023-07-22 | Disposition: A | Discharge status: Home / Self Care | Payer: 59 | Source: Ambulatory Visit | Attending: Internal Medicine | Admitting: Internal Medicine

## 2023-07-22 DIAGNOSIS — R079 Chest pain, unspecified: Secondary | ICD-10-CM

## 2023-07-22 DIAGNOSIS — I2089 Other forms of angina pectoris: Secondary | ICD-10-CM

## 2023-07-22 MED ORDER — IOHEXOL 350 MG/ML SOLN: 80.0000 mL | Freq: Once | INTRAVENOUS | Status: DC | PRN

## 2023-07-22 MED ORDER — METOPROLOL TARTRATE 100 MG PO TABS
100.0000 mg | ORAL_TABLET | Freq: Once | ORAL | 0 refills | Status: AC
Start: 2023-07-22 — End: 2023-07-22

## 2023-07-22 MED ORDER — IVABRADINE HCL 5 MG PO TABS
15.0000 mg | ORAL_TABLET | Freq: Once | ORAL | 0 refills | Status: AC
Start: 2023-07-22 — End: 2023-07-22

## 2023-07-27 ENCOUNTER — Encounter (HOSPITAL_COMMUNITY): Payer: Self-pay

## 2023-07-28 ENCOUNTER — Ambulatory Visit: Admission: RE | Admit: 2023-07-28 | Payer: 59 | Source: Ambulatory Visit

## 2023-08-12 ENCOUNTER — Ambulatory Visit
Admission: RE | Admit: 2023-08-12 | Discharge: 2023-08-12 | Disposition: A | Discharge status: Home / Self Care | Payer: 59 | Source: Ambulatory Visit | Attending: Specialist | Admitting: Specialist

## 2023-08-12 DIAGNOSIS — J440 Chronic obstructive pulmonary disease with acute lower respiratory infection: Secondary | ICD-10-CM | POA: Diagnosis present

## 2023-08-18 ENCOUNTER — Telehealth (HOSPITAL_COMMUNITY): Payer: Self-pay | Admitting: *Deleted

## 2023-08-18 NOTE — Telephone Encounter (Signed)
Reaching out to patient to offer assistance regarding upcoming cardiac imaging study; pt verbalizes understanding of appt date/time, parking situation and where to check in, pre-test NPO status and medications ordered, and verified current allergies; name and call back number provided for further questions should they arise Hayley Sharpe RN Navigator Cardiac Imaging [facility] [phone] office [phone] cell  

## 2023-08-19 ENCOUNTER — Ambulatory Visit: Admission: RE | Admit: 2023-08-19 | Payer: 59 | Source: Ambulatory Visit

## 2023-09-01 ENCOUNTER — Encounter (HOSPITAL_COMMUNITY): Payer: Self-pay

## 2023-09-01 ENCOUNTER — Telehealth (HOSPITAL_COMMUNITY): Payer: Self-pay | Admitting: Emergency Medicine

## 2023-09-01 NOTE — Telephone Encounter (Signed)
Pt wishes to cancel because she has a home nurse visit tomorrow that she doesn't want to miss. Rockwell Alexandria RN Navigator Cardiac Imaging [facility] [phone] Office  [phone] Cell

## 2023-09-02 ENCOUNTER — Ambulatory Visit: Admission: RE | Admit: 2023-09-02 | Payer: 59 | Source: Ambulatory Visit

## 2023-09-08 ENCOUNTER — Telehealth (HOSPITAL_COMMUNITY): Payer: Self-pay | Admitting: Emergency Medicine

## 2023-09-08 NOTE — Telephone Encounter (Signed)
Reaching out to patient to offer assistance regarding upcoming cardiac imaging study; pt verbalizes understanding of appt date/time, parking situation and where to check in, pre-test NPO status and medications ordered, and verified current allergies; name and call back number provided for further questions should they arise Cayne Yom RN Navigator Cardiac Imaging [facility] [phone] office [phone] cell 

## 2023-09-09 ENCOUNTER — Ambulatory Visit
Admission: RE | Admit: 2023-09-09 | Discharge: 2023-09-09 | Disposition: A | Discharge status: Home / Self Care | Payer: 59 | Source: Ambulatory Visit | Attending: Internal Medicine | Admitting: Internal Medicine

## 2023-09-09 DIAGNOSIS — I2089 Other forms of angina pectoris: Secondary | ICD-10-CM | POA: Insufficient documentation

## 2023-09-09 LAB — POCT I-STAT CREATININE: Creatinine, Ser: 1.1 mg/dL — ABNORMAL HIGH (ref 0.44–1.00)

## 2023-09-09 MED ORDER — METOPROLOL TARTRATE 5 MG/5ML IV SOLN
10.0000 mg | Freq: Once | INTRAVENOUS | Status: AC
  Administered 2023-09-09: 10 mg via INTRAVENOUS

## 2023-09-09 MED ORDER — SODIUM CHLORIDE 0.9 % IV SOLN: INTRAVENOUS | Status: DC

## 2023-09-09 MED ORDER — DILTIAZEM HCL 25 MG/5ML IV SOLN
10.0000 mg | Freq: Once | INTRAVENOUS | Status: AC
  Administered 2023-09-09: 10 mg via INTRAVENOUS

## 2023-09-09 MED ORDER — NITROGLYCERIN 0.4 MG SL SUBL
0.8000 mg | SUBLINGUAL_TABLET | Freq: Once | SUBLINGUAL | Status: AC
  Administered 2023-09-09: 0.8 mg via SUBLINGUAL

## 2023-09-09 MED ORDER — IOHEXOL 350 MG/ML SOLN
75.0000 mL | Freq: Once | INTRAVENOUS | Status: AC | PRN
  Administered 2023-09-09: 75 mL via INTRAVENOUS

## 2023-09-09 NOTE — Progress Notes (Signed)

## 2023-09-22 IMAGING — US US THYROID
1 series · 12 of 25 positions shown · non-contrast
Comparison: None.

CLINICAL DATA: Palpable abnormality.  Palpable thyroid nodule.

EXAM:
THYROID ULTRASOUND
TECHNIQUE: Ultrasound examination of the thyroid gland and adjacent soft
tissues was performed.

[Series 1: us thyroid · 98 acquisitions, 12 frames shown]
[im 5/98]
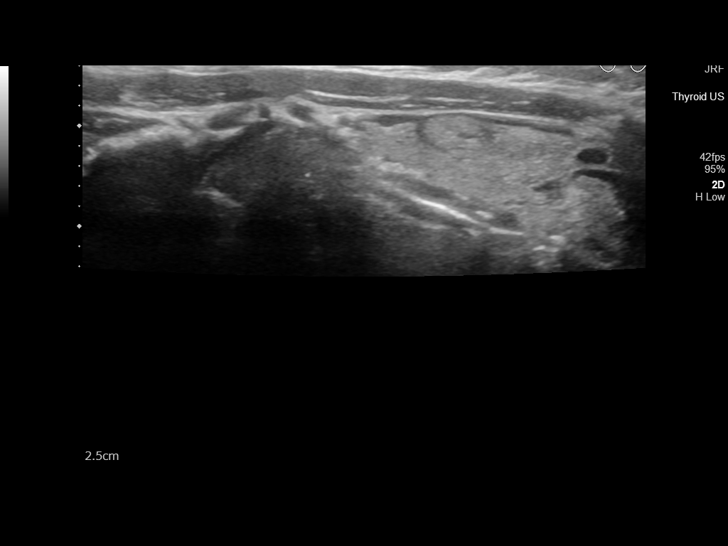
[im 13/98]
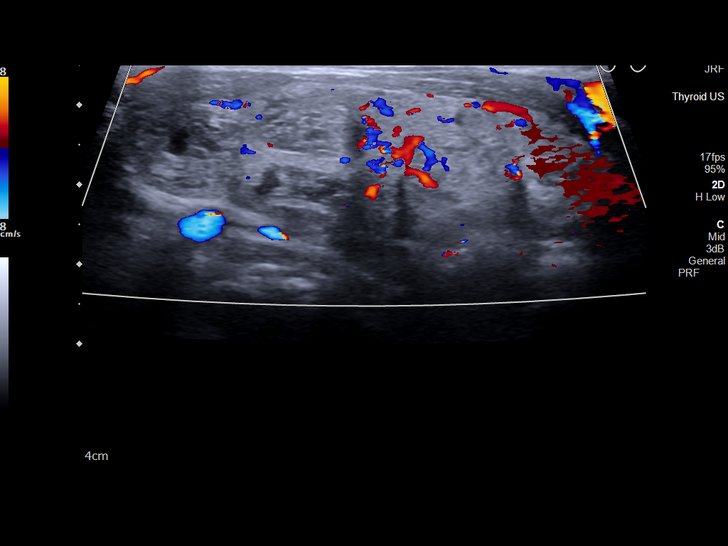
[im 21/98]
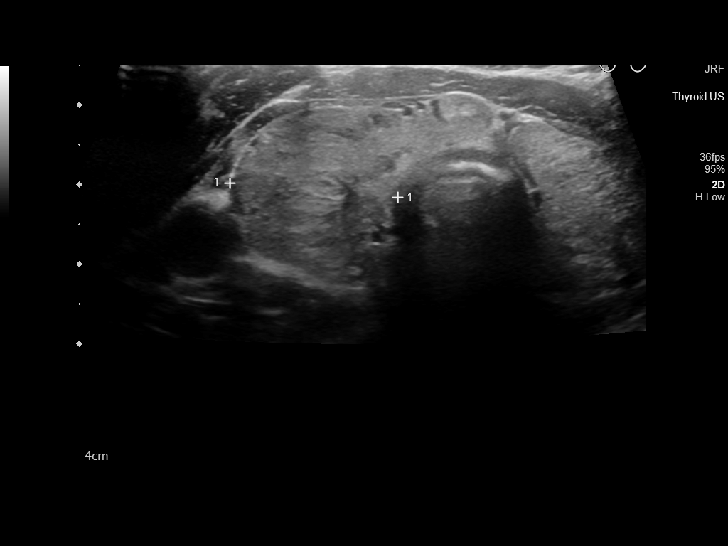
[im 29/98]
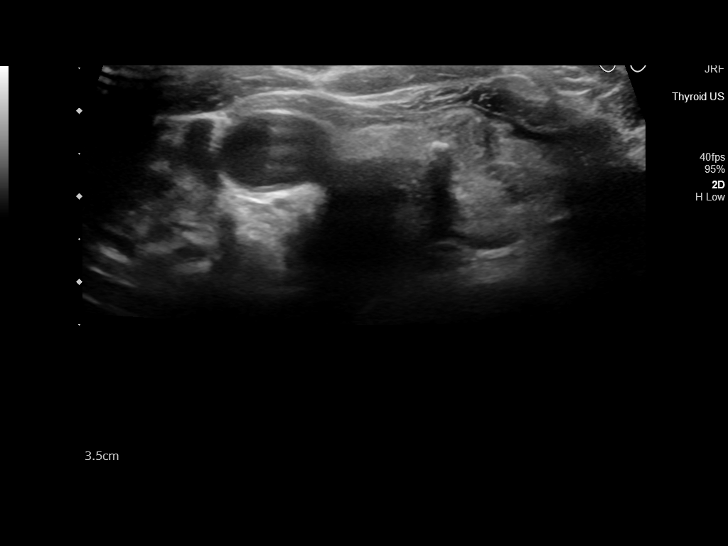
[im 37/98]
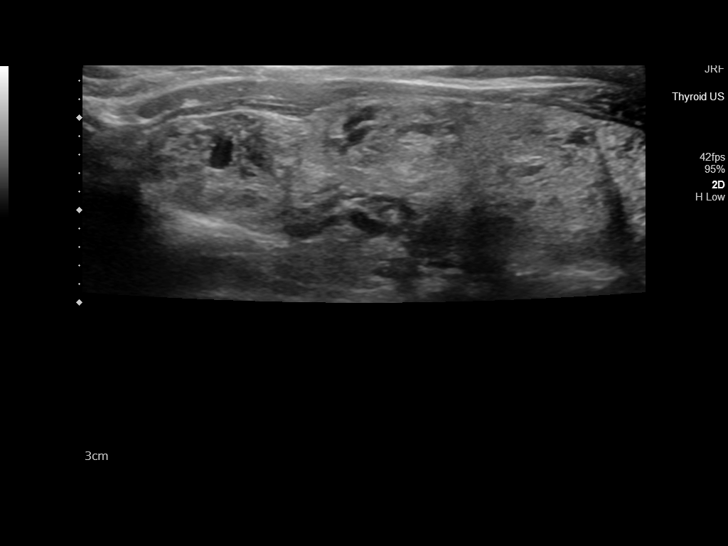
[im 45/98]
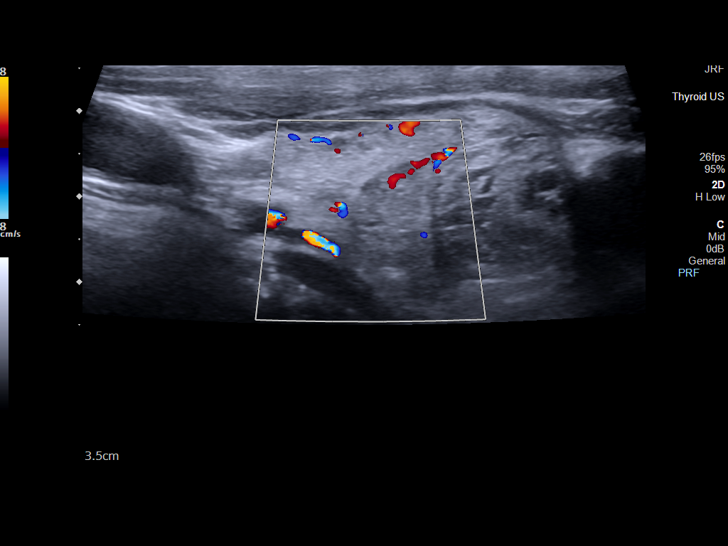
[im 53/98]
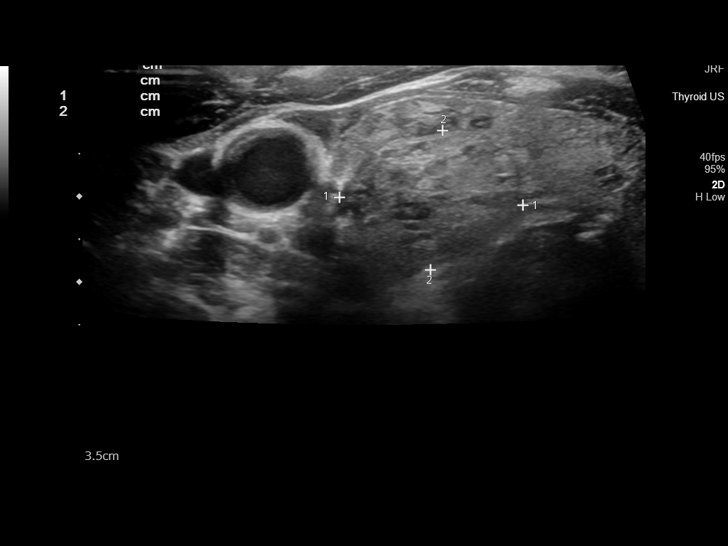
[im 61/98]
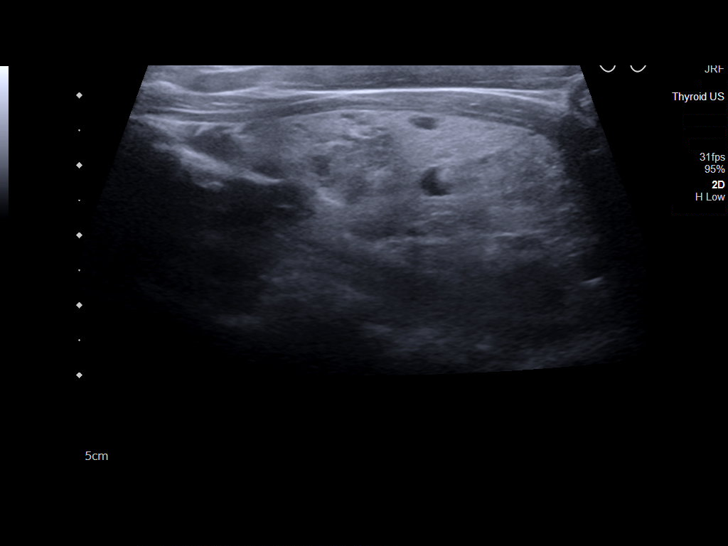
[im 69/98]
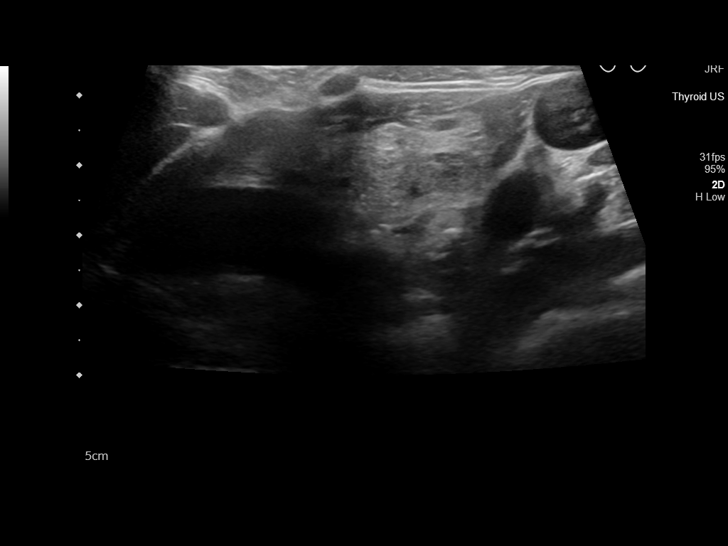
[im 77/98]
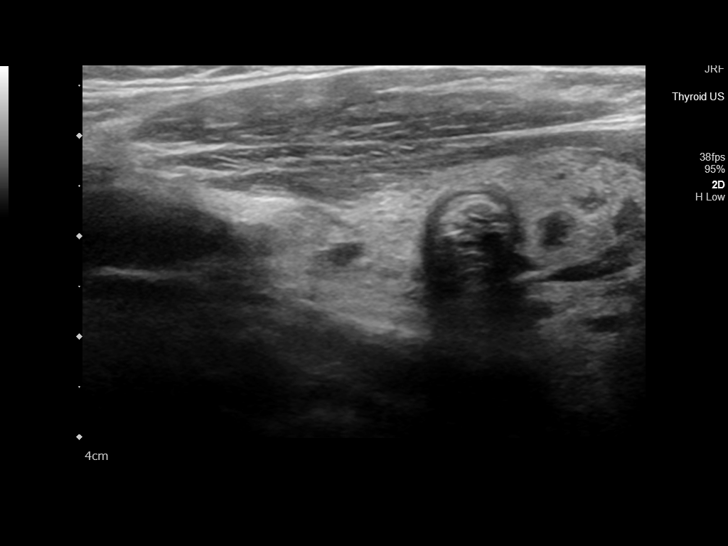
[im 85/98]
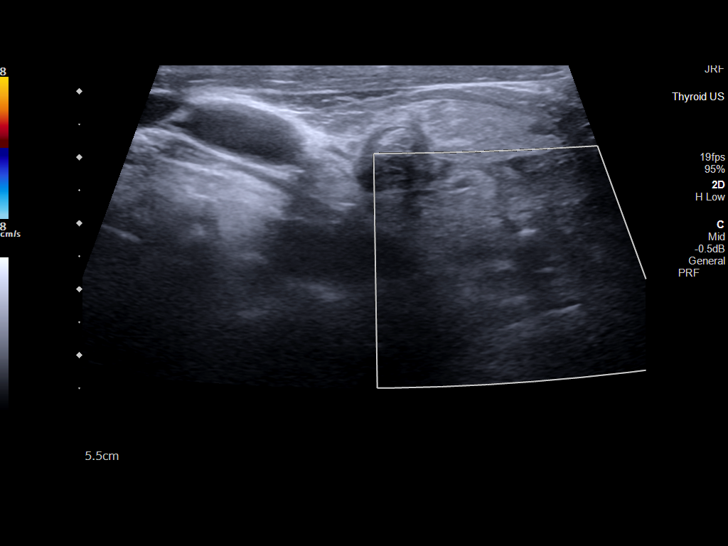
[im 93/98]
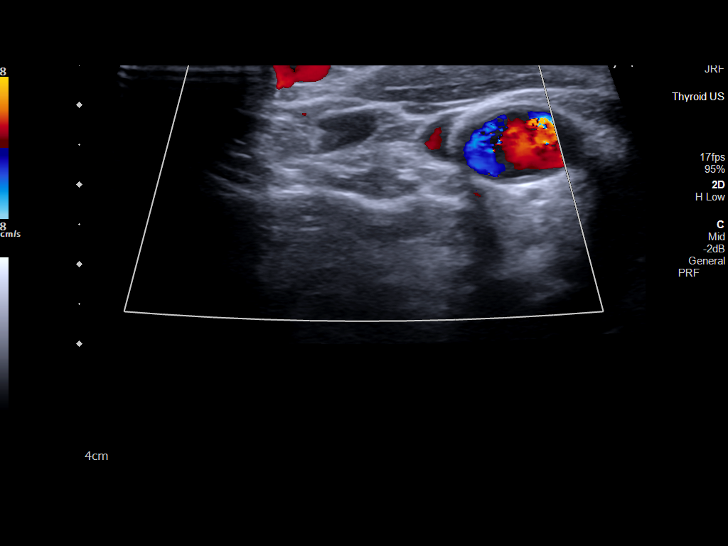

[12 of 25 positions shown; findings below may reference images not displayed]

FINDINGS: Parenchymal Echotexture: Markedly heterogenous

Isthmus: Borderline enlarged measuring 1.2 cm in diameter

Right lobe: Enlarged measures 6.4 x 2.3 x 2.1 cm

Left lobe: Enlarged measures 6.0 x 3.0 x 2.0 cm

_________________________________________________________

Estimated total number of nodules >/= 1 cm: 6-10

Number of spongiform nodules >/=  2 cm not described below (TR1): 0

Number of mixed cystic and solid nodules >/= 1.5 cm not described
below (TR2): 0

_________________________________________________________

There is an approximately 0.8 x 0.6 x 0.5 cm isoechoic ill-defined
nodule/pseudonodule within the thyroid isthmus (labeled 1), which
does not meet criteria to recommend percutaneous sampling or
continued dedicated follow-up.

_________________________________________________________

There is a punctate (approximately 3 mm) dystrophic shadowing
calcification within the inferior pole of the right lobe of the
thyroid which is without associated nodule or mass.

_________________________________________________________

There is an approximately 1.7 x 1.3 x 1.2 cm
spongiform/benign-appearing nodule within the superior pole the
right lobe of the thyroid (labeled 2), which does not meet criteria
to recommend percutaneous sampling or continued dedicated follow-up

There is an approximately 1.5 x 1.5 x 1.0 cm
spongiform/benign-appearing nodule within mid aspect the right lobe
of the thyroid (labeled 3), which does not meet criteria to
recommend percutaneous sampling or continued dedicated follow-up

There is an approximately 1.4 x 1.2 x 1.0 cm isoechoic
spongiform/benign-appearing nodule within the inferior pole the
right lobe of the thyroid (labeled 4) which does not meet criteria
to recommend percutaneous sampling or continued dedicated follow-up

There is an approximately 2.2 x 2.0 x 1.6 cm isoechoic
spongiform/benign-appearing nodule within the inferior pole of the
right lobe of the thyroid (labeled 5), which does not meet criteria
to recommend percutaneous sampling or continued dedicated follow-up

_________________________________________________________

There is an approximately 2.0 x 1.3 x 1.2 cm
spongiform/benign-appearing nodule within the superior pole of the
left lobe of the thyroid (labeled 6), which does not meet criteria
to recommend percutaneous sampling or continued dedicated follow-up.

_________________________________________________________

Nodule # 7:

Location: Left; Mid

Maximum size: 1.1 cm; Other 2 dimensions: 1.1 x 0.8 cm

Composition: solid/almost completely solid (2)

Echogenicity: hypoechoic (2)

Shape: not taller-than-wide (0)

Margins: smooth (0)

Echogenic foci: peripheral calcifications (2)

ACR TI-RADS total points: 6.

ACR TI-RADS risk category: TR4 (4-6 points).

ACR TI-RADS recommendations:

*Given size (>/= 1 - 1.4 cm) and appearance, a follow-up ultrasound
in 1 year should be considered based on TI-RADS criteria.

_________________________________________________________

Questioned 1.5 x 1.4 x 1.4 cm isoechoic ill-defined nodule within
the inferior pole the left lobe of the thyroid (labeled 8) which
favored to represent a pseudonodule as it lacks defined borders on
both provided transverse and sagittal images.
IMPRESSION: 1. Thyromegaly with findings suggestive of multinodular goiter.
2. Nodule #7 meets imaging criteria to recommend a 1 follow-up as
indicated.
3. None of the additional discretely measured nodules/pseudo
nodules, the majority of which appears spongiform/benign, meet
imaging criteria to recommend percutaneous sampling or continued
dedicated follow-up.

The above is in keeping with the ACR TI-RADS recommendations - [HOSPITAL] 0775;[DATE].

## 2023-12-15 ENCOUNTER — Other Ambulatory Visit: Payer: Self-pay | Admitting: Internal Medicine

## 2023-12-15 DIAGNOSIS — Z1231 Encounter for screening mammogram for malignant neoplasm of breast: Secondary | ICD-10-CM

## 2024-01-27 IMAGING — CR DG LUMBAR SPINE COMPLETE 4+V
1 series · 5 of 5 positions shown · non-contrast
Comparison: None.

CLINICAL DATA: Dizziness and weakness with subsequent fall.

EXAM:
LUMBAR SPINE - COMPLETE 4+ VIEW

[Series 1: dg lumbar spine complete 4 +v · 0.14mm/px · 5 of 5 slices shown]
[im 1/5]
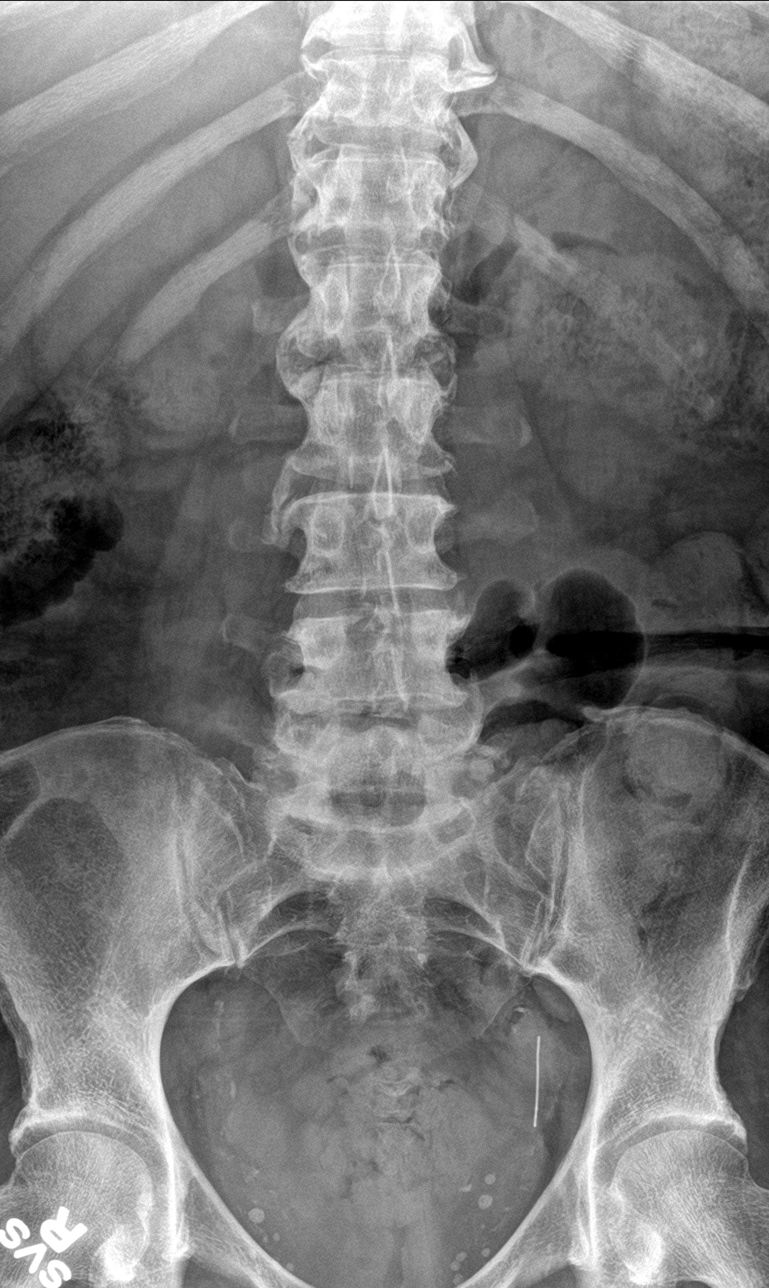
[im 2/5]
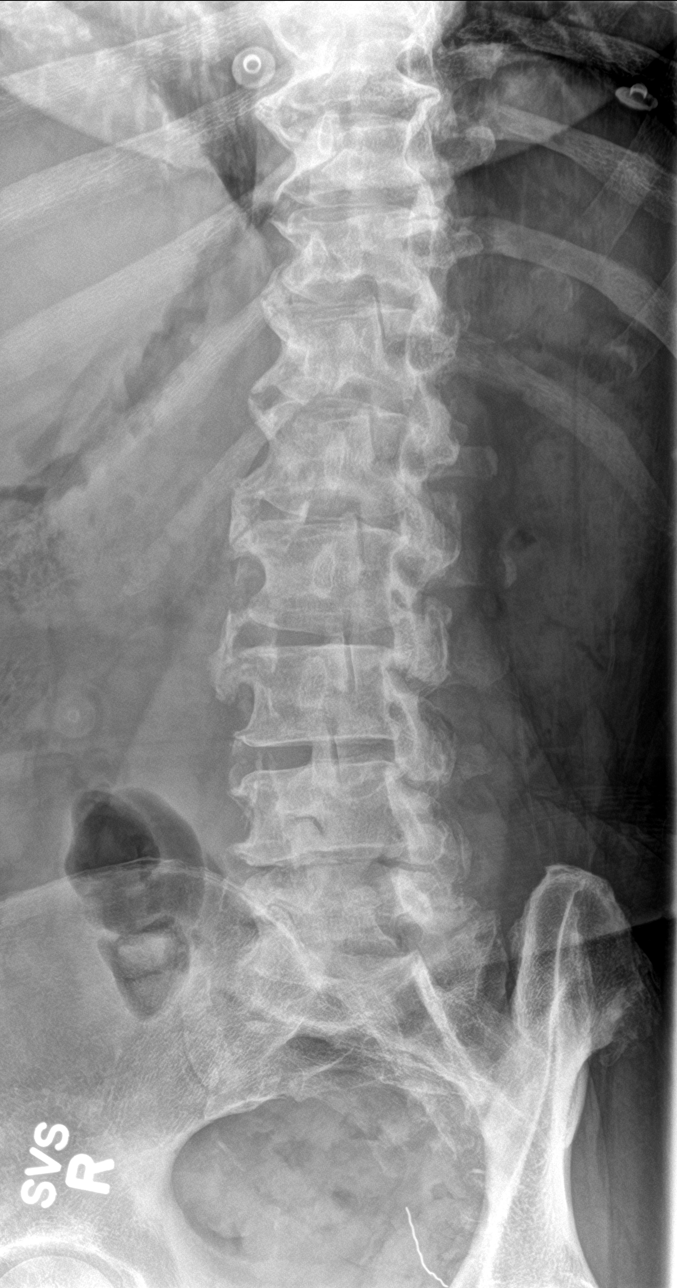
[im 3/5]
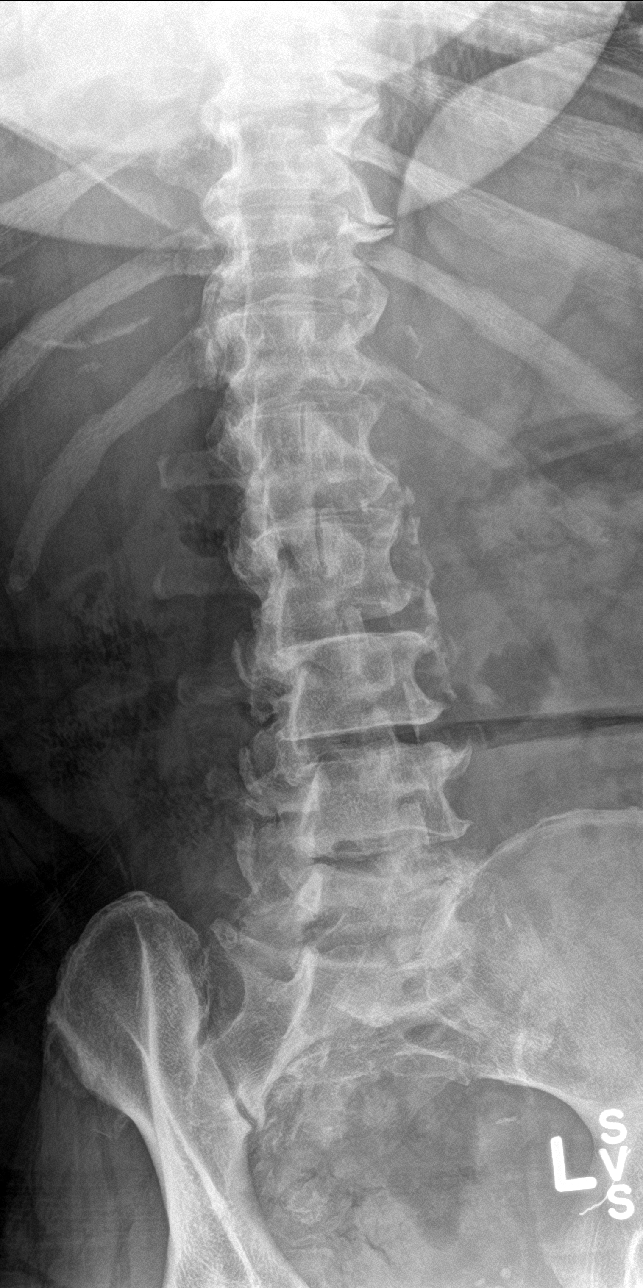
[im 4/5]
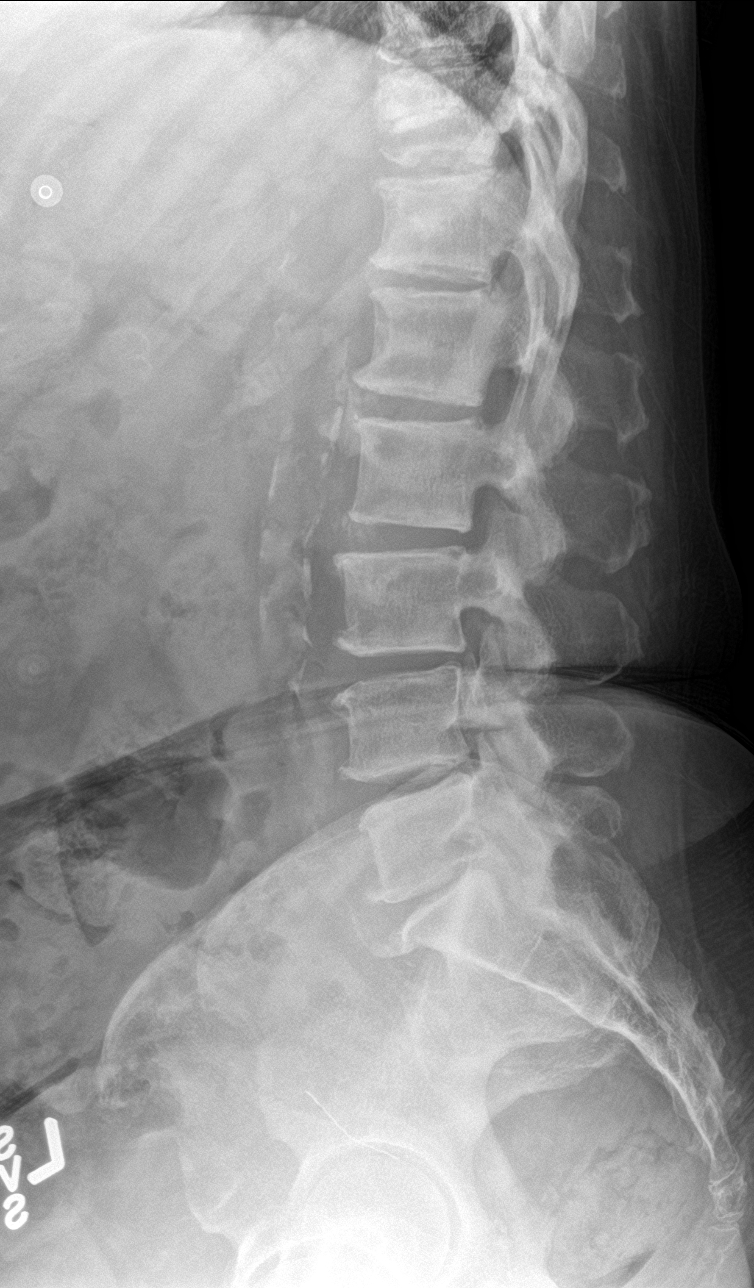
[im 5/5]
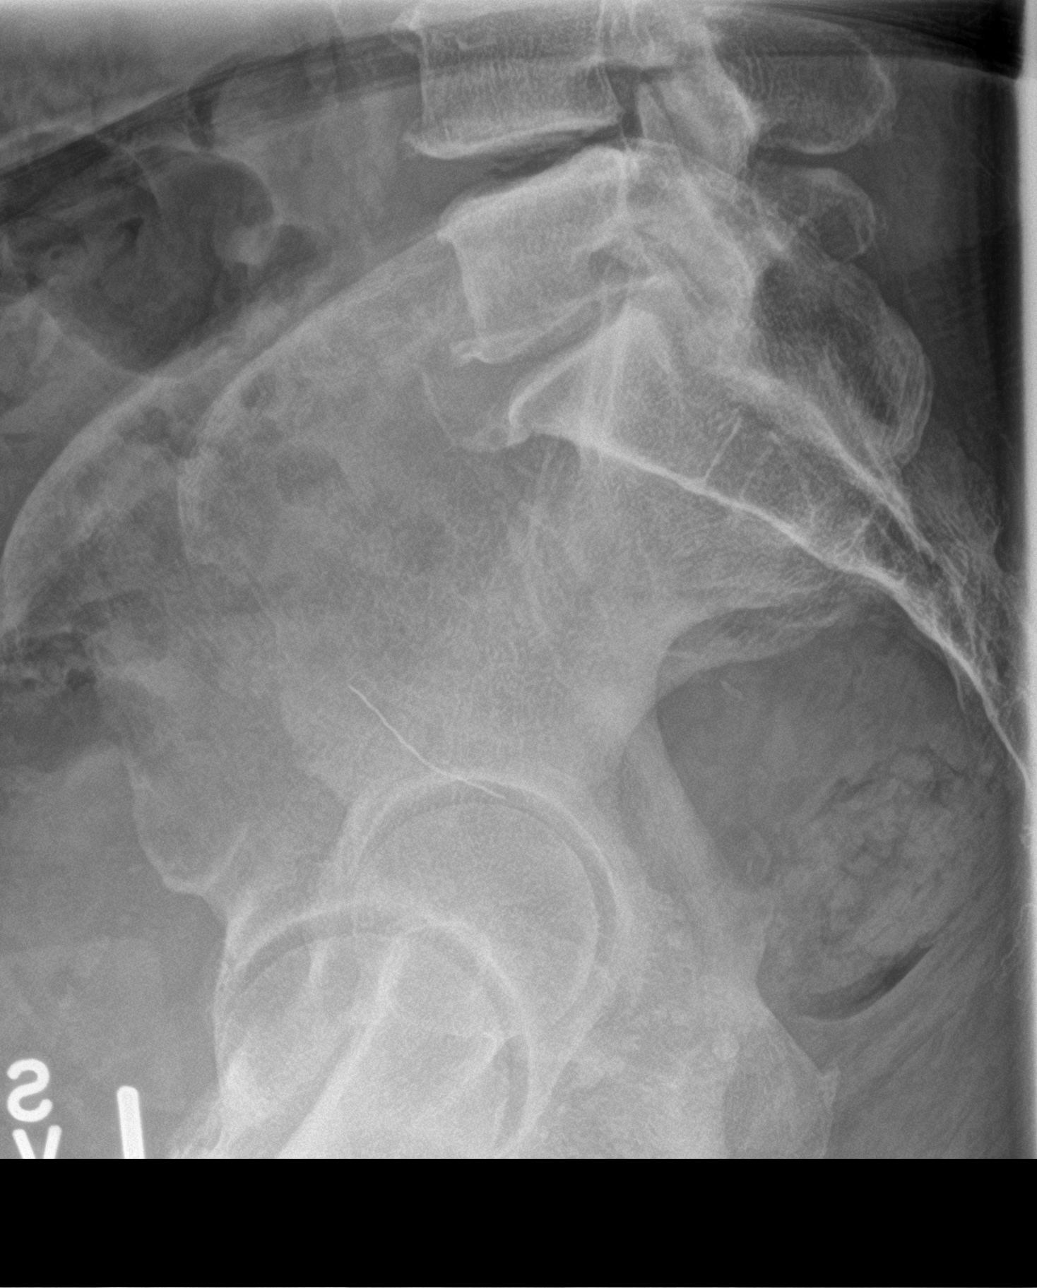

[5 of 5 positions shown; findings below may reference images not displayed]

FINDINGS: There is no evidence of an acute lumbar spine fracture. Alignment is
normal. Moderate severity multilevel endplate sclerosis with
moderate to marked severity anterior and lateral osteophyte
formation are seen throughout all levels of the lumbar spine. Mild
multilevel intervertebral disc space narrowing is seen. This is
slightly more prominent at the level of L5-S1.
IMPRESSION: Marked  severity multilevel degenerative disc disease.

## 2024-08-12 ENCOUNTER — Other Ambulatory Visit: Payer: Self-pay | Admitting: Specialist

## 2024-08-12 DIAGNOSIS — J449 Chronic obstructive pulmonary disease, unspecified: Secondary | ICD-10-CM

## 2024-08-12 DIAGNOSIS — F17210 Nicotine dependence, cigarettes, uncomplicated: Secondary | ICD-10-CM

## 2024-08-25 ENCOUNTER — Ambulatory Visit

## 2024-09-02 ENCOUNTER — Ambulatory Visit

## 2024-09-09 ENCOUNTER — Ambulatory Visit
Admission: RE | Admit: 2024-09-09 | Discharge: 2024-09-09 | Disposition: A | Discharge status: Home / Self Care | Source: Ambulatory Visit | Attending: Specialist | Admitting: Specialist

## 2024-09-09 DIAGNOSIS — J449 Chronic obstructive pulmonary disease, unspecified: Secondary | ICD-10-CM | POA: Insufficient documentation

## 2024-09-09 DIAGNOSIS — F17210 Nicotine dependence, cigarettes, uncomplicated: Secondary | ICD-10-CM | POA: Insufficient documentation

## 2024-09-16 ENCOUNTER — Other Ambulatory Visit: Payer: Self-pay | Admitting: Internal Medicine

## 2024-09-16 DIAGNOSIS — Z0000 Encounter for general adult medical examination without abnormal findings: Secondary | ICD-10-CM

## 2024-09-16 DIAGNOSIS — R42 Dizziness and giddiness: Secondary | ICD-10-CM

## 2024-09-23 ENCOUNTER — Ambulatory Visit: Attending: Internal Medicine

## 2024-10-22 ENCOUNTER — Emergency Department: Admission: EM | Admit: 2024-10-22 | Discharge: 2024-10-23 | Disposition: A

## 2024-10-22 DIAGNOSIS — I2510 Atherosclerotic heart disease of native coronary artery without angina pectoris: Secondary | ICD-10-CM | POA: Insufficient documentation

## 2024-10-22 DIAGNOSIS — K047 Periapical abscess without sinus: Secondary | ICD-10-CM | POA: Insufficient documentation

## 2024-10-22 DIAGNOSIS — F17200 Nicotine dependence, unspecified, uncomplicated: Secondary | ICD-10-CM | POA: Insufficient documentation

## 2024-10-22 DIAGNOSIS — E119 Type 2 diabetes mellitus without complications: Secondary | ICD-10-CM | POA: Insufficient documentation

## 2024-10-22 DIAGNOSIS — J449 Chronic obstructive pulmonary disease, unspecified: Secondary | ICD-10-CM | POA: Insufficient documentation

## 2024-10-22 DIAGNOSIS — Z951 Presence of aortocoronary bypass graft: Secondary | ICD-10-CM | POA: Insufficient documentation

## 2024-10-22 DIAGNOSIS — L03211 Cellulitis of face: Secondary | ICD-10-CM | POA: Diagnosis not present

## 2024-10-22 DIAGNOSIS — R220 Localized swelling, mass and lump, head: Secondary | ICD-10-CM | POA: Diagnosis present

## 2024-10-22 MED ORDER — LABETALOL HCL 5 MG/ML IV SOLN
10.0000 mg | Freq: Once | INTRAVENOUS | Status: AC
  Administered 2024-10-23: 10 mg via INTRAVENOUS
  Filled 2024-10-22: qty 4

## 2024-10-22 MED ORDER — METOPROLOL TARTRATE 25 MG PO TABS
25.0000 mg | ORAL_TABLET | Freq: Once | ORAL | Status: AC
  Administered 2024-10-23: 25 mg via ORAL
  Filled 2024-10-22: qty 1

## 2024-10-22 NOTE — ED Triage Notes (Signed)
 Pt. In via POV from home for facial swelling, swelling noted on left side of face under eye, pt. States she has a bad on the top left of her mouth and thinks that is causing the swelling, BP in triage 233/107, patient states she did not take her BP meds today because she did not feel like it

## 2024-10-22 NOTE — ED Provider Notes (Signed)
 "  [facility] Provider Note    Event Date/Time   First MD Initiated Contact with Patient 10/22/24 2346     (approximate)   History   Facial Swelling   HPI  Anne Brennan is a 64 y.o. female  oronary disease coronary bypass surgery COPD smoking diabetes hyperlipidemia fibromyalgia chronic pain GERD bypass surgery who presents with 4 days of left facial swelling and pain.  Patient states that she has poor dentition and has been meaning to follow-up with a dentist to have multiple teeth removed.  She noticed swelling under her left eye several days ago and has continued to progress.  Denies any fevers or chills difficulty handling her secretions swelling or shortness of breath.  She did not take her blood pressure medications today as she did not feel like it.  She is allergic to penicillin.      Physical Exam   Triage Vital Signs: ED Triage Vitals  Encounter Vitals Group     BP 10/22/24 2331 (!) 233/107     Girls Systolic BP Percentile --      Girls Diastolic BP Percentile --      Boys Systolic BP Percentile --      Boys Diastolic BP Percentile --      Pulse Rate 10/22/24 2331 (!) 109     Resp 10/22/24 2331 20     Temp 10/22/24 2331 99.2 F (37.3 C)     Temp Source 10/22/24 2331 Oral     SpO2 10/22/24 2331 98 %     Weight 10/22/24 2338 155 lb (70.3 kg)     Height 10/22/24 2338 5' 4 (1.626 m)     Head Circumference --      Peak Flow --      Pain Score 10/22/24 2338 9     Pain Loc --      Pain Education --      Exclude from Growth Chart --     Most recent vital signs: Vitals:   10/23/24 0207 10/23/24 0245  BP:  (!) 158/91  Pulse:  85  Resp:  20  Temp: 99.8 F (37.7 C)   SpO2:  93%    Nursing Triage Note reviewed. Vital signs reviewed and patients oxygen saturation is normoxic  General: Patient is well nourished, well developed, awake and alert, resting comfortably in no acute distress Head: Normocephalic and atraumatic Eyes:  Normal inspection, extraocular muscles intact, no conjunctival pallor Ear, nose, throat: Mild left maxillary facial edema with no periorbital edema at this time, there is no crepitus in this area is erythematous.  Patient has extremely poor dentition with multiple broken teeth on the left maxillary molars but no palpable fluctuance, floor mouth is soft tongue and uvula is not edematous Neck: Normal range of motion, no stridor Respiratory: Patient is in no respiratory distress, lungs CTAB Cardiovascular: Patient is not tachycardic, RRR without murmur appreciated GI: Abd SNT with no guarding or rebound  Back: Normal inspection of the back with good strength and range of motion throughout all ext Extremities: pulses intact with good cap refills, no LE pitting edema or calf tenderness Neuro: The patient is alert and oriented to person, place, and time, appropriately conversive, with 5/5 bilat UE/LE strength, no gross motor or sensory defects noted. Coordination appears to be adequate. Skin: Warm, dry, and intact Psych: normal mood and affect, no SI or HI  ED Results / Procedures / Treatments   Labs (all labs ordered are listed,  but only abnormal results are displayed) Labs Reviewed  BASIC METABOLIC PANEL WITH GFR - Abnormal; Notable for the following components:      Result Value   Glucose, Bld 284 (*)    BUN 6 (*)    All other components within normal limits  CBC WITH DIFFERENTIAL/PLATELET     EKG EKG and rhythm strip are interpreted by myself:   EKG: [Normal sinus rhythm] at heart rate of 77, normal QRS duration, QTc 496, nonspecific ST segments and T waves no ectopy EKG not consistent with Acute STEMI Rhythm strip: NSR in lead II   RADIOLOGY CT face with iv contrast: Facial cellulitis with periapical abscess of teeth on my independent review interpretation radiologist agrees    PROCEDURES:  Critical Care performed: No  Procedures   MEDICATIONS ORDERED IN ED: Medications   labetalol  (NORMODYNE ) injection 10 mg (10 mg Intravenous Given 10/23/24 0012)  metoprolol  tartrate (LOPRESSOR ) tablet 25 mg (25 mg Oral Given 10/23/24 0014)  morphine  (PF) 4 MG/ML injection 4 mg (4 mg Intravenous Given 10/23/24 0040)  ondansetron  (ZOFRAN ) injection 4 mg (4 mg Intravenous Given 10/23/24 0040)  clindamycin  (CLEOCIN ) IVPB 600 mg (0 mg Intravenous Stopped 10/23/24 0235)  iohexol  (OMNIPAQUE ) 300 MG/ML solution 80 mL (80 mLs Intravenous Contrast Given 10/23/24 0131)     IMPRESSION / MDM / ASSESSMENT AND PLAN / ED COURSE                                Differential diagnosis includes, but is not limited to, facial cellulitis, facial abscess, periapical abscess, electrolyte derangement anemia, sepsis  ED course: Patient is well-appearing and I have no concern for airway compromise based on exam.  She had no leukocytosis or acute anemia.  She had no electrolyte derangements except for an elevated glucose but had no elevated anion gap.  CT face was consistent with a facial cellulitis with a periapical abscess (nothing drainable on exam).  Given patient's allergy to penicillins we will start the patient on clindamycin  for coverage of the face.  Patient was counseled extensively about the warning signs of C. difficile and she voiced understanding.  Patient was counseled to call [facility] and also a dentist soon as possible and return with any acutely worsening symptoms.   Clinical Course as of 10/23/24 0411  [place] Oct 23, 2024  0215 Will discuss with [facility] [HD]  0220 BP(!): 165/76 Better [HD]  0231 I reviewed the workup with the patient including the need to follow-up urgently with [facility] she voiced understanding and willingness to do so and will call on Monday for an appointment.  Will discharge her with clindamycin  and Percocet.  She will return if any acutely worsening symptoms [HD]    Clinical Course User Index [HD] Nicholaus Rolland BRAVO, MD   -- Risk: 5 This patient has a high risk of  morbidity due to further diagnostic testing or treatment. Rationale: This patients evaluation and management involve a high risk of morbidity due to the potential severity of presenting symptoms, need for diagnostic testing, and/or initiation of treatment that may require close monitoring. The differential includes conditions with potential for significant deterioration or requiring escalation of care. Treatment decisions in the ED, including medication administration, procedural interventions, or disposition planning, reflect this level of risk. COPA: 5 The patient has the following acute or chronic illness/injury that poses a possible threat to life or bodily function: [X] : The patient  has a potentially serious acute condition or an acute exacerbation of a chronic illness requiring urgent evaluation and management in the Emergency Department. The clinical presentation necessitates immediate consideration of life-threatening or function-threatening diagnoses, even if they are ultimately ruled out.   FINAL CLINICAL IMPRESSION(S) / ED DIAGNOSES   Final diagnoses:  Facial cellulitis  Periapical abscess     Rx / DC Orders   ED Discharge Orders          Ordered    clindamycin  (CLEOCIN ) 300 MG capsule  3 times daily        10/23/24 0232    ondansetron  (ZOFRAN -ODT) 4 MG disintegrating tablet  Every 8 hours PRN        10/23/24 0232    oxyCODONE -acetaminophen  (PERCOCET) 5-325 MG tablet  Every 4 hours PRN        10/23/24 0233             Note:  This document was prepared using Dragon voice recognition software and may include unintentional dictation errors.   Nicholaus Rolland BRAVO, MD 10/23/24 [phone]  "

## 2024-10-23 ENCOUNTER — Emergency Department

## 2024-10-23 DIAGNOSIS — L03211 Cellulitis of face: Secondary | ICD-10-CM | POA: Diagnosis not present

## 2024-10-23 LAB — CBC WITH DIFFERENTIAL/PLATELET
Abs Immature Granulocytes: 0.03 K/uL (ref 0.00–0.07)
Basophils Absolute: 0 K/uL (ref 0.0–0.1)
Basophils Relative: 1 %
Eosinophils Absolute: 0.1 K/uL (ref 0.0–0.5)
Eosinophils Relative: 1 %
HCT: 42.3 % (ref 36.0–46.0)
Hemoglobin: 14.4 g/dL (ref 12.0–15.0)
Immature Granulocytes: 0 %
Lymphocytes Relative: 27 %
Lymphs Abs: 2.4 K/uL (ref 0.7–4.0)
MCH: 31.6 pg (ref 26.0–34.0)
MCHC: 34 g/dL (ref 30.0–36.0)
MCV: 92.8 fL (ref 80.0–100.0)
Monocytes Absolute: 0.8 K/uL (ref 0.1–1.0)
Monocytes Relative: 9 %
Neutro Abs: 5.5 K/uL (ref 1.7–7.7)
Neutrophils Relative %: 62 %
Platelets: 227 K/uL (ref 150–400)
RBC: 4.56 MIL/uL (ref 3.87–5.11)
RDW: 12.8 % (ref 11.5–15.5)
WBC: 8.8 K/uL (ref 4.0–10.5)
nRBC: 0 % (ref 0.0–0.2)

## 2024-10-23 LAB — BASIC METABOLIC PANEL WITH GFR
Anion gap: 14 (ref 5–15)
BUN: 6 mg/dL — ABNORMAL LOW (ref 8–23)
CO2: 25 mmol/L (ref 22–32)
Calcium: 9.4 mg/dL (ref 8.9–10.3)
Chloride: 99 mmol/L (ref 98–111)
Creatinine, Ser: 0.69 mg/dL (ref 0.44–1.00)
GFR, Estimated: >60 mL/min
Glucose, Bld: 284 mg/dL — ABNORMAL HIGH (ref 70–99)
Potassium: 3.9 mmol/L (ref 3.5–5.1)
Sodium: 138 mmol/L (ref 135–145)

## 2024-10-23 MED ORDER — ONDANSETRON 4 MG PO TBDP: 4.0000 mg | ORAL_TABLET | Freq: Three times a day (TID) | ORAL | 0 refills | Status: AC | PRN

## 2024-10-23 MED ORDER — ONDANSETRON HCL 4 MG/2ML IJ SOLN
4.0000 mg | Freq: Once | INTRAMUSCULAR | Status: AC
  Administered 2024-10-23: 4 mg via INTRAVENOUS
  Filled 2024-10-23: qty 2

## 2024-10-23 MED ORDER — CLINDAMYCIN HCL 300 MG PO CAPS: 300.0000 mg | ORAL_CAPSULE | Freq: Three times a day (TID) | ORAL | 0 refills | Status: AC

## 2024-10-23 MED ORDER — CLINDAMYCIN PHOSPHATE 600 MG/50ML IV SOLN
600.0000 mg | Freq: Once | INTRAVENOUS | Status: AC
  Administered 2024-10-23: 600 mg via INTRAVENOUS
  Filled 2024-10-23: qty 50

## 2024-10-23 MED ORDER — OXYCODONE-ACETAMINOPHEN 5-325 MG PO TABS: 1.0000 | ORAL_TABLET | ORAL | 0 refills | Status: AC | PRN

## 2024-10-23 MED ORDER — MORPHINE SULFATE (PF) 4 MG/ML IV SOLN
4.0000 mg | Freq: Once | INTRAVENOUS | Status: AC
  Administered 2024-10-23: 4 mg via INTRAVENOUS
  Filled 2024-10-23: qty 1

## 2024-10-23 MED ORDER — IOHEXOL 300 MG/ML  SOLN
80.0000 mL | Freq: Once | INTRAMUSCULAR | Status: AC | PRN
  Administered 2024-10-23: 80 mL via INTRAVENOUS

## 2024-10-23 NOTE — Discharge Instructions (Signed)
 You were seen in the emergency department with facial swelling.  This is secondary to a dental abscess due to poor dentition.  You need to follow-up with both her dentist and her oral maxillary facial surgeon.  Please monitor yourself for signs of C. difficile infection.  Your next dose of antibiotics will be due tomorrow morning.  Please do not drive while taking pain medication and return with any acutely worsening symptoms or any other emergency. -- RETURN PRECAUTIONS & AFTERCARE: (ENGLISH) RETURN PRECAUTIONS: Return immediately to the emergency department or see/call your doctor if you feel worse, weak or have changes in speech or vision, are short of breath, have fever, vomiting, pain, bleeding or dark stool, trouble urinating or any new issues. Return here or see/call your doctor if not improving as expected for your suspected condition. FOLLOW-UP CARE: Call your doctor and/or any doctors we referred you to for more advice and to make an appointment. Do this today, tomorrow or after the weekend. Some doctors only take PPO insurance so if you have HMO insurance you may want to contact your HMO or your regular doctor for referral to a specialist within your plan. Either way tell the doctor's office that it was a referral from the emergency department so you get the soonest possible appointment.  YOUR TEST RESULTS: Take result reports of any blood or urine tests, imaging tests and EKG's to your doctor and any referral doctor. Have any abnormal tests repeated. Your doctor or a referral doctor can let you know when this should be done. Also make sure your doctor contacts this hospital to get any test results that are not currently available such as cultures or special tests for infection and final imaging reports, which are often not available at the time you leave the ER but which may list additional important findings that are not documented on the preliminary report. BLOOD PRESSURE: If your blood pressure  was greater than 120/80 have your blood pressure rechecked within 1 to 2 weeks. MEDICATION SIDE EFFECTS: Do not drive, walk, bike, take the bus, etc. if you have received or are being prescribed any sedating medications such as those for pain or anxiety or certain antihistamines like Benadryl. If you have been give one of these here get a taxi home or have a friend drive you home. Ask your pharmacist to counsel you on potential side effects of any new medication
